# Patient Record
Sex: Male | Born: 1960 | Race: White | Hispanic: No | Marital: Married | Smoking: Never smoker
Health system: Southern US, Community
[De-identification: ages and names within clinical notes are randomized; demographics above are authoritative.]

## PROBLEM LIST (undated history)

## (undated) DIAGNOSIS — T8859XA Other complications of anesthesia, initial encounter: Secondary | ICD-10-CM

## (undated) DIAGNOSIS — I10 Essential (primary) hypertension: Secondary | ICD-10-CM

## (undated) DIAGNOSIS — E785 Hyperlipidemia, unspecified: Secondary | ICD-10-CM

## (undated) DIAGNOSIS — E119 Type 2 diabetes mellitus without complications: Secondary | ICD-10-CM

## (undated) DIAGNOSIS — T4145XA Adverse effect of unspecified anesthetic, initial encounter: Secondary | ICD-10-CM

## (undated) DIAGNOSIS — G43909 Migraine, unspecified, not intractable, without status migrainosus: Secondary | ICD-10-CM

## (undated) HISTORY — DX: Hyperlipidemia, unspecified: E78.5

## (undated) HISTORY — DX: Migraine, unspecified, not intractable, without status migrainosus: G43.909

## (undated) HISTORY — PX: COLONOSCOPY: SHX174

## (undated) HISTORY — DX: Type 2 diabetes mellitus without complications: E11.9

## (undated) HISTORY — PX: BACK SURGERY: SHX140

---

## 2002-03-27 LAB — HM COLONOSCOPY

## 2003-09-08 HISTORY — PX: HEMORRHOID SURGERY: SHX153

## 2013-11-06 ENCOUNTER — Ambulatory Visit (INDEPENDENT_AMBULATORY_CARE_PROVIDER_SITE_OTHER): Payer: BC Managed Care – PPO | Admitting: Adult Health

## 2013-11-06 ENCOUNTER — Encounter: Payer: Self-pay | Admitting: Adult Health

## 2013-11-06 VITALS — BP 128/78 | HR 96 | Temp 98.1°F | Resp 14 | Ht 70.5 in | Wt 146.0 lb

## 2013-11-06 DIAGNOSIS — Z0000 Encounter for general adult medical examination without abnormal findings: Secondary | ICD-10-CM

## 2013-11-06 DIAGNOSIS — IMO0002 Reserved for concepts with insufficient information to code with codable children: Secondary | ICD-10-CM

## 2013-11-06 DIAGNOSIS — E1165 Type 2 diabetes mellitus with hyperglycemia: Secondary | ICD-10-CM

## 2013-11-06 DIAGNOSIS — IMO0001 Reserved for inherently not codable concepts without codable children: Secondary | ICD-10-CM

## 2013-11-06 DIAGNOSIS — E538 Deficiency of other specified B group vitamins: Secondary | ICD-10-CM

## 2013-11-06 DIAGNOSIS — E559 Vitamin D deficiency, unspecified: Secondary | ICD-10-CM

## 2013-11-06 MED ORDER — SITAGLIP PHOS-METFORMIN HCL ER 50-1000 MG PO TB24: 1.0000 | ORAL_TABLET | Freq: Every day | ORAL | Status: DC

## 2013-11-06 MED ORDER — BUTALBITAL-APAP-CAFFEINE 50-325-40 MG PO TABS: 1.0000 | ORAL_TABLET | Freq: Four times a day (QID) | ORAL | Status: DC | PRN

## 2013-11-06 MED ORDER — GLIMEPIRIDE 4 MG PO TABS: 4.0000 mg | ORAL_TABLET | Freq: Every day | ORAL | Status: DC

## 2013-11-06 MED ORDER — PROPRANOLOL HCL 80 MG PO TABS: 80.0000 mg | ORAL_TABLET | Freq: Two times a day (BID) | ORAL | Status: DC

## 2013-11-06 NOTE — Progress Notes (Signed)
Subjective:    Patient ID: Jonathan West, male    DOB: [birth year], 53 y.o.   MRN: [id number]  HPI  Pt is a pleasant 53 y/o male who presents to clinic to establish care. He has a history of DM type 2. Has not taken any of his medications in > 1 year. He reports that his physician changed practices and he just became "hard headed". He reports > 45 lb weight loss in 8 months. He actually decided to establish with new PCP secondary to his weight loss for fear "that something was terribly wrong". He denies feeling bad. He has a good appetite but continues to lose weight. 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     Past Medical History  Diagnosis Date  . Diabetes mellitus without complication   . Migraines      Past Surgical History  Procedure Laterality Date  . Hemorrhoid surgery  2005    [facility] Knee      Family History  Problem Relation Age of Onset  . Heart disease Father   . Diabetes Father   . Heart disease Maternal Grandfather   . Heart disease Paternal Grandfather      History   Social History  . Marital Status: Married    Spouse Name: N/A    Number of Children: N/A  . Years of Education: N/A   Occupational History  . Not on file.   Social History Main Topics  . Smoking status: Never Smoker   . Smokeless tobacco: Not on file  . Alcohol Use: No  . Drug Use: No  . Sexual Activity: Not on file   Other Topics Concern  . Not on file   Social History Narrative  . No narrative on file     Review of Systems  Constitutional: Positive for unexpected weight change. Negative for activity change, appetite change and fatigue.  HENT: Negative.   Eyes: Positive for visual disturbance.  Respiratory: Negative.   Cardiovascular: Negative.   Gastrointestinal: Negative.   Endocrine: Positive for polydipsia.  Genitourinary: Negative.   Musculoskeletal: Negative.   Skin: Negative.   Allergic/Immunologic: Negative.   Neurological:  Negative.  Numbness: numbness and tingling feet.  Hematological: Negative.   Psychiatric/Behavioral: Negative for sleep disturbance. The patient is nervous/anxious.        Objective:   Physical Exam  Constitutional: He is oriented to person, place, and time. He appears well-developed and well-nourished. No distress.  Thin, pleasant gentleman in NAD. Appears concerned about significant weight loss  HENT:  Head: Normocephalic and atraumatic.  Mouth/Throat: No oropharyngeal exudate.  Eyes: Conjunctivae and EOM are normal. Pupils are equal, round, and reactive to light.  Neck: Normal range of motion. Neck supple. No tracheal deviation present.  Cardiovascular: Normal rate, regular rhythm, normal heart sounds and intact distal pulses.  Exam reveals no gallop and no friction rub.   No murmur heard. Pulmonary/Chest: Effort normal and breath sounds normal. No respiratory distress. He has no wheezes. He has no rales.  Abdominal: Soft. Bowel sounds are normal.  Musculoskeletal: Normal range of motion. He exhibits no edema.  Neurological: He is alert and oriented to person, place, and time. He has normal reflexes. No cranial nerve deficit. Coordination normal.  Skin: Skin is warm and dry.  Psychiatric: He has a normal mood and affect. His behavior is normal. Judgment and thought content normal.       Assessment & Plan:  1. DM (diabetes mellitus), type 2, uncontrolled Has not taken his diabetes medications in > 1 year. Suspect A1C is significantly elevated. He has not been monitoring his blood glucose levels either. Significant weight loss. Check labs including kidney function.  - Hemoglobin A1c; Future - Comprehensive metabolic panel - Hemoglobin A1c  2. Routine general medical examination at a health care facility Normal physical exam except for problems listed separately. Check labs. Request records from previous PCP - CBC with Differential - Lipid panel; Future - TSH  3. B12  deficiency Hx of B12 def. Not currently taking any supplements. Check labs.  - Vitamin B12  4. Vitamin D deficiency Hx of Vit D deficiency. Check levels.  - Vit D  25 hydroxy; Future

## 2013-11-06 NOTE — Patient Instructions (Signed)
   Thank you for choosing [facility] for your health care needs.  Please have your labs drawn prior to leaving the office.  The results will be available through MyChart for your convenience. Please remember to activate this. The activation code is located at the end of this form.  Please start your medications as prescribed. I have sent in the prescriptions to your pharmacy.

## 2013-11-06 NOTE — Progress Notes (Signed)
Pre visit review using our clinic review tool, if applicable. No additional management support is needed unless otherwise documented below in the visit note. 

## 2013-11-07 ENCOUNTER — Encounter: Payer: Self-pay | Admitting: Adult Health

## 2013-11-07 ENCOUNTER — Telehealth: Payer: Self-pay | Admitting: *Deleted

## 2013-11-07 ENCOUNTER — Ambulatory Visit (INDEPENDENT_AMBULATORY_CARE_PROVIDER_SITE_OTHER): Payer: BC Managed Care – PPO | Admitting: Adult Health

## 2013-11-07 VITALS — BP 108/78 | HR 85 | Temp 98.7°F | Resp 12 | Wt 146.0 lb

## 2013-11-07 DIAGNOSIS — E119 Type 2 diabetes mellitus without complications: Secondary | ICD-10-CM

## 2013-11-07 DIAGNOSIS — R899 Unspecified abnormal finding in specimens from other organs, systems and tissues: Secondary | ICD-10-CM

## 2013-11-07 DIAGNOSIS — R6889 Other general symptoms and signs: Secondary | ICD-10-CM

## 2013-11-07 LAB — CBC WITH DIFFERENTIAL/PLATELET
BASOS ABS: 0 10*3/uL (ref 0.0–0.1)
Basophils Relative: 0.9 % (ref 0.0–3.0)
EOS PCT: 2.1 % (ref 0.0–5.0)
Eosinophils Absolute: 0.1 10*3/uL (ref 0.0–0.7)
HEMATOCRIT: 46.3 % (ref 39.0–52.0)
HEMOGLOBIN: 15.8 g/dL (ref 13.0–17.0)
LYMPHS ABS: 1.2 10*3/uL (ref 0.7–4.0)
Lymphocytes Relative: 22.2 % (ref 12.0–46.0)
MCHC: 34.1 g/dL (ref 30.0–36.0)
MCV: 87.9 fl (ref 78.0–100.0)
MONO ABS: 0.4 10*3/uL (ref 0.1–1.0)
MONOS PCT: 6.9 % (ref 3.0–12.0)
NEUTROS ABS: 3.6 10*3/uL (ref 1.4–7.7)
Neutrophils Relative %: 67.9 % (ref 43.0–77.0)
Platelets: 191 10*3/uL (ref 150.0–400.0)
RBC: 5.28 Mil/uL (ref 4.22–5.81)
RDW: 12.6 % (ref 11.5–14.6)
WBC: 5.3 10*3/uL (ref 4.5–10.5)

## 2013-11-07 LAB — COMPREHENSIVE METABOLIC PANEL
ALT: 20 U/L (ref 0–53)
AST: 16 U/L (ref 0–37)
Albumin: 4 g/dL (ref 3.5–5.2)
Alkaline Phosphatase: 172 U/L — ABNORMAL HIGH (ref 39–117)
BUN: 15 mg/dL (ref 6–23)
CO2: 25 meq/L (ref 19–32)
CREATININE: 1.1 mg/dL (ref 0.4–1.5)
Calcium: 9.1 mg/dL (ref 8.4–10.5)
Chloride: 90 mEq/L — ABNORMAL LOW (ref 96–112)
GFR: 73.16 mL/min (ref >60.00)
Glucose, Bld: 744 mg/dL (ref 70–99)
Potassium: 4.4 mEq/L (ref 3.5–5.1)
Sodium: 125 mEq/L — ABNORMAL LOW (ref 135–145)
Total Bilirubin: 0.8 mg/dL (ref 0.3–1.2)
Total Protein: 7 g/dL (ref 6.0–8.3)

## 2013-11-07 LAB — VITAMIN B12: Vitamin B-12: 405 pg/mL (ref 211–911)

## 2013-11-07 LAB — VITAMIN D 25 HYDROXY (VIT D DEFICIENCY, FRACTURES): Vit D, 25-Hydroxy: 24 ng/mL — ABNORMAL LOW (ref 30–89)

## 2013-11-07 LAB — GLUCOSE, POCT (MANUAL RESULT ENTRY): POC GLUCOSE: 240 mg/dL — AB (ref 70–99)

## 2013-11-07 LAB — TSH: TSH: 0.82 u[IU]/mL (ref 0.35–5.50)

## 2013-11-07 LAB — HEMOGLOBIN A1C: Hgb A1c MFr Bld: 16.4 % — ABNORMAL HIGH (ref 4.6–6.5)

## 2013-11-07 MED ORDER — INSULIN PEN NEEDLE 32G X 4 MM MISC: Status: DC

## 2013-11-07 MED ORDER — ERGOCALCIFEROL 1.25 MG (50000 UT) PO CAPS: 50000.0000 [IU] | ORAL_CAPSULE | ORAL | Status: DC

## 2013-11-07 MED ORDER — GLUCOSE BLOOD VI STRP: ORAL_STRIP | Status: DC

## 2013-11-07 MED ORDER — INSULIN ASPART 100 UNIT/ML FLEXPEN: 10.0000 [IU] | PEN_INJECTOR | Freq: Every day | SUBCUTANEOUS | Status: DC

## 2013-11-07 NOTE — Telephone Encounter (Signed)
Needs to be seen today

## 2013-11-07 NOTE — Patient Instructions (Signed)
  Start Vitamin D (Drisdol) 1 capsule weekly.  STart NovoLog Flex pen - 10 units every morning.  Check your blood sugar levels before breakfast, lunch, dinner and before bedtime. Write down these numbers and bring them with you on your next visit.  Come in for a follow up visit on Friday.

## 2013-11-07 NOTE — Telephone Encounter (Signed)
Spoke with pt, explained to him that we would need to see him today, glucose was 744, pt agreed to be here today 11/07/13 at 4:15

## 2013-11-07 NOTE — Progress Notes (Signed)
Pre visit review using our clinic review tool, if applicable. No additional management support is needed unless otherwise documented below in the visit note. 

## 2013-11-07 NOTE — Telephone Encounter (Signed)
DCVUDTHY from [facility] called with critical results on your patient as noted below:  Critical Glucose 744.   Encounter forwarded to Raquel as high priority and printed and given to Raquel.

## 2013-11-07 NOTE — Progress Notes (Signed)
Patient ID: Jonathan West, male   DOB: Apr 12, 1961, 53 y.o.   MRN: [id number]    Subjective:    Patient ID: Jonathan West, male    DOB: May 27, 1961, 52 y.o.   MRN: [id number]  Diabetes Hypoglycemia symptoms include nervousness/anxiousness. Pertinent negatives for hypoglycemia include no confusion. Associated symptoms include polydipsia and polyphagia.    Pt was seen in clinic yesterday to establish care. Hx of DM not taken his DM medications for ~ 1.5 years. I was called today that patient's glucose was 744. Pt was notified and asked to come to clinic to start insulin - education.   Past Medical History  Diagnosis Date  . Diabetes mellitus without complication   . Migraines     Current Outpatient Prescriptions on File Prior to Visit  Medication Sig Dispense Refill  . butalbital-acetaminophen-caffeine (FIORICET, ESGIC) 50-325-40 MG per tablet Take 1 tablet by mouth every 6 (six) hours as needed for headache.  30 tablet  4  . glimepiride (AMARYL) 4 MG tablet Take 1 tablet (4 mg total) by mouth daily before breakfast.  30 tablet  11  . propranolol (INDERAL) 80 MG tablet Take 1 tablet (80 mg total) by mouth 2 (two) times daily.  60 tablet  11  . SitaGLIPtin-MetFORMIN HCl 50-1000 MG TB24 Take 1 tablet by mouth daily.  30 tablet  0   No current facility-administered medications on file prior to visit.     Review of Systems  Constitutional: Positive for unexpected weight change.  Eyes: Negative for visual disturbance.  Endocrine: Positive for polydipsia and polyphagia.  Neurological: Negative for numbness.  Psychiatric/Behavioral: Negative for suicidal ideas, behavioral problems, confusion, sleep disturbance and agitation. The patient is nervous/anxious.   All other systems reviewed and are negative.       Objective:  BP 108/78  Pulse 85  Temp(Src) 98.7 F (37.1 C) (Oral)  Resp 12  Wt 146 lb (66.225 kg)  SpO2 97%   Physical Exam  Constitutional: He is oriented to person,  place, and time. He appears well-developed and well-nourished. No distress.  HENT:  Head: Normocephalic and atraumatic.  Eyes: Conjunctivae and EOM are normal.  Neck: Normal range of motion. Neck supple.  Cardiovascular: Normal rate, regular rhythm, normal heart sounds and intact distal pulses.  Exam reveals no gallop and no friction rub.   No murmur heard. Pulmonary/Chest: Effort normal. No respiratory distress.  Musculoskeletal: Normal range of motion. He exhibits no edema.  Neurological: He is alert and oriented to person, place, and time. He has normal reflexes. No cranial nerve deficit. Coordination normal.  Skin: Skin is warm and dry.  Psychiatric: He has a normal mood and affect. His behavior is normal. Judgment and thought content normal.       Assessment & Plan:   1. Diabetes Uncontrolled diabetes. Lab called with reports of pt's glucose of 744, HgbA1c 16.4% Pt was asked to come to clinic. Blood glucose was 240. He started taking his diabetes meds today. Start Novolog flexpen 10 units in the morning. Spent greater than 45 min of face to face time in the education of diet, medication and insulin administration. Pt self administered dose of insulin successfully. He will RTC on Friday afternoon with blood glucose log. He is to check BG 4 times a day until sugars are better controlled. We will titrate up his insulin as needed.  - POCT Glucose (CBG) - glucose blood test strip; One Touch Ultra strips to use at home to test blood sugars 2 times  daily.  Dispense: 100 each; Refill: 12 - Insulin Pen Needle 32G X 4 MM MISC; Use with insulin pen to inject into skin.  Dispense: 100 each; Refill: 2  2. Abnormal laboratory test results Labs results were reviewed with pt including Vitamin D level low. Start Drisdol 50,000 units weekly x 12 weeks then reassess.

## 2013-11-08 ENCOUNTER — Telehealth: Payer: Self-pay

## 2013-11-08 NOTE — Telephone Encounter (Signed)
Relevant patient education assigned to patient using Emmi. ° °

## 2013-11-09 ENCOUNTER — Encounter: Payer: Self-pay | Admitting: Adult Health

## 2013-11-09 DIAGNOSIS — E538 Deficiency of other specified B group vitamins: Secondary | ICD-10-CM | POA: Insufficient documentation

## 2013-11-09 DIAGNOSIS — IMO0002 Reserved for concepts with insufficient information to code with codable children: Secondary | ICD-10-CM | POA: Insufficient documentation

## 2013-11-09 DIAGNOSIS — Z0000 Encounter for general adult medical examination without abnormal findings: Secondary | ICD-10-CM | POA: Insufficient documentation

## 2013-11-09 DIAGNOSIS — E1165 Type 2 diabetes mellitus with hyperglycemia: Secondary | ICD-10-CM | POA: Insufficient documentation

## 2013-11-09 DIAGNOSIS — E559 Vitamin D deficiency, unspecified: Secondary | ICD-10-CM | POA: Insufficient documentation

## 2013-11-09 DIAGNOSIS — E118 Type 2 diabetes mellitus with unspecified complications: Secondary | ICD-10-CM | POA: Insufficient documentation

## 2013-11-10 ENCOUNTER — Encounter: Payer: Self-pay | Admitting: Adult Health

## 2013-11-10 ENCOUNTER — Ambulatory Visit (INDEPENDENT_AMBULATORY_CARE_PROVIDER_SITE_OTHER): Payer: BC Managed Care – PPO | Admitting: Adult Health

## 2013-11-10 VITALS — BP 100/60 | HR 67 | Resp 14 | Wt 146.0 lb

## 2013-11-10 DIAGNOSIS — IMO0001 Reserved for inherently not codable concepts without codable children: Secondary | ICD-10-CM

## 2013-11-10 DIAGNOSIS — IMO0002 Reserved for concepts with insufficient information to code with codable children: Secondary | ICD-10-CM

## 2013-11-10 DIAGNOSIS — E1165 Type 2 diabetes mellitus with hyperglycemia: Secondary | ICD-10-CM

## 2013-11-10 LAB — GLUCOSE, POCT (MANUAL RESULT ENTRY): POC GLUCOSE: 288 mg/dL — AB (ref 70–99)

## 2013-11-10 MED ORDER — INSULIN ASPART 100 UNIT/ML FLEXPEN: 12.0000 [IU] | PEN_INJECTOR | Freq: Every day | SUBCUTANEOUS | Status: DC

## 2013-11-10 NOTE — Progress Notes (Signed)
Pre visit review using our clinic review tool, if applicable. No additional management support is needed unless otherwise documented below in the visit note. 

## 2013-11-10 NOTE — Progress Notes (Signed)
Patient ID: Jonathan West, male   DOB: 1961/05/21, 53 y.o.   MRN: [id number]    Subjective:    Patient ID: Jonathan West, male    DOB: 21-Apr-1961, 53 y.o.   MRN: [id number]  HPI  Mr. Yearby is here today for f/u diabetes since being started on NovoLog 10 units daily. He has been very compliant with all of his medications, checking his blood glucose levels 4 times a day and monitoring his diet. He has been following Dr. Lupita Dawn low glycemic diet. His blood glucose now in clinic is 288 but he thinks it may have been related to barbeque sauce he had at lunch. His blood glucose levels are running between 140s - 160s. He has no reported lows. Highest BG has been in the morning and they have been 200. Pt is motivated to improve his uncontrolled diabetes with A1c of 16.4%    Past Medical History  Diagnosis Date  . Diabetes mellitus without complication   . Migraines     Current Outpatient Prescriptions on File Prior to Visit  Medication Sig Dispense Refill  . butalbital-acetaminophen-caffeine (FIORICET, ESGIC) 50-325-40 MG per tablet Take 1 tablet by mouth every 6 (six) hours as needed for headache.  30 tablet  4  . ergocalciferol (DRISDOL) 50000 UNITS capsule Take 1 capsule (50,000 Units total) by mouth once a week. Take once weekly for 12 weeks  4 capsule  2  . glucose blood test strip One Touch Ultra strips to use at home to test blood sugars 2 times daily.  100 each  12  . Insulin Pen Needle 32G X 4 MM MISC Use with insulin pen to inject into skin.  100 each  2  . propranolol (INDERAL) 80 MG tablet Take 1 tablet (80 mg total) by mouth 2 (two) times daily.  60 tablet  11  . SitaGLIPtin-MetFORMIN HCl 50-1000 MG TB24 Take 1 tablet by mouth daily.  30 tablet  0   No current facility-administered medications on file prior to visit.     Review of Systems  Constitutional: Negative.   Respiratory: Negative.   Cardiovascular: Negative.   Gastrointestinal: Negative.  Negative for abdominal pain  and diarrhea.  Endocrine: Negative for polydipsia, polyphagia and polyuria.  Genitourinary: Negative.   All other systems reviewed and are negative.       Objective:  BP 100/60  Pulse 67  Resp 14  Wt 146 lb (66.225 kg)  SpO2 97%   Physical Exam  Constitutional: He is oriented to person, place, and time. He appears well-developed and well-nourished. No distress.  Cardiovascular: Normal rate and regular rhythm.   Pulmonary/Chest: Effort normal. No respiratory distress.  Neurological: He is alert and oriented to person, place, and time.  Skin: Skin is warm and dry.  Psychiatric: He has a normal mood and affect. His behavior is normal. Judgment and thought content normal.       Assessment & Plan:   1. Diabetes type 2, uncontrolled He is doing well with medications, diet, monitoring glucose. Increase NovoLog to 12 units daily. Follow up in 1 month.  - POCT Glucose (CBG)

## 2013-12-12 ENCOUNTER — Ambulatory Visit (INDEPENDENT_AMBULATORY_CARE_PROVIDER_SITE_OTHER): Payer: BC Managed Care – PPO | Admitting: Adult Health

## 2013-12-12 ENCOUNTER — Encounter: Payer: Self-pay | Admitting: Adult Health

## 2013-12-12 VITALS — BP 102/68 | HR 63 | Temp 98.8°F | Resp 14 | Wt 156.0 lb

## 2013-12-12 DIAGNOSIS — R6889 Other general symptoms and signs: Secondary | ICD-10-CM

## 2013-12-12 DIAGNOSIS — R899 Unspecified abnormal finding in specimens from other organs, systems and tissues: Secondary | ICD-10-CM

## 2013-12-12 DIAGNOSIS — E1165 Type 2 diabetes mellitus with hyperglycemia: Secondary | ICD-10-CM

## 2013-12-12 DIAGNOSIS — IMO0002 Reserved for concepts with insufficient information to code with codable children: Secondary | ICD-10-CM

## 2013-12-12 DIAGNOSIS — IMO0001 Reserved for inherently not codable concepts without codable children: Secondary | ICD-10-CM

## 2013-12-12 LAB — COMPREHENSIVE METABOLIC PANEL
ALT: 24 U/L (ref 0–53)
AST: 17 U/L (ref 0–37)
Albumin: 3.9 g/dL (ref 3.5–5.2)
Alkaline Phosphatase: 77 U/L (ref 39–117)
BUN: 21 mg/dL (ref 6–23)
CALCIUM: 9.3 mg/dL (ref 8.4–10.5)
CHLORIDE: 104 meq/L (ref 96–112)
CO2: 28 mEq/L (ref 19–32)
Creatinine, Ser: 0.9 mg/dL (ref 0.4–1.5)
GFR: 91.98 mL/min (ref >60.00)
Glucose, Bld: 156 mg/dL — ABNORMAL HIGH (ref 70–99)
POTASSIUM: 4.3 meq/L (ref 3.5–5.1)
Sodium: 138 mEq/L (ref 135–145)
TOTAL PROTEIN: 6.4 g/dL (ref 6.0–8.3)
Total Bilirubin: 0.9 mg/dL (ref 0.3–1.2)

## 2013-12-12 LAB — HEMOGLOBIN A1C: HEMOGLOBIN A1C: 10.1 % — AB (ref 4.6–6.5)

## 2013-12-12 NOTE — Progress Notes (Signed)
Pre visit review using our clinic review tool, if applicable. No additional management support is needed unless otherwise documented below in the visit note. 

## 2013-12-12 NOTE — Progress Notes (Signed)
Patient ID: Jonathan West, male   DOB: [birth year], 53 y.o.   MRN: [id number]    Subjective:    Patient ID: Jonathan West, male    DOB: 1961/04/20, 53 y.o.   MRN: [id number]  HPI  Pt is a pleasant 53 y/o male who presents for DM type 2 follow up. He is compliant with diet and medication. He brings his BG log with him showing a few readings in the high 60s. These usually occur when he has been hours without any PO. 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 Good about taking his lunch and snacks with him to work. Average blood glucose reading 130 - 140. He is feeling much better since diagnosis and beginning treatment. He will schedule his diabetic eye exam within the next couple of months.   Past Medical History  Diagnosis Date  . Diabetes mellitus without complication   . Migraines     Current Outpatient Prescriptions on File Prior to Visit  Medication Sig Dispense Refill  . butalbital-acetaminophen-caffeine (FIORICET, ESGIC) 50-325-40 MG per tablet Take 1 tablet by mouth every 6 (six) hours as needed for headache.  30 tablet  4  . ergocalciferol (DRISDOL) 50000 UNITS capsule Take 1 capsule (50,000 Units total) by mouth once a week. Take once weekly for 12 weeks  4 capsule  2  . glimepiride (AMARYL) 4 MG tablet Take 4 mg by mouth daily with breakfast.      . glucose blood test strip One Touch Ultra strips to use at home to test blood sugars 2 times daily.  100 each  12  . insulin aspart (NOVOLOG FLEXPEN) 100 UNIT/ML FlexPen Inject 12 Units into the skin daily.  4 pen  11  . Insulin Pen Needle 32G X 4 MM MISC Use with insulin pen to inject into skin.  100 each  2  . propranolol (INDERAL) 80 MG tablet Take 1 tablet (80 mg total) by mouth 2 (two) times daily.  60 tablet  11  . SitaGLIPtin-MetFORMIN HCl 50-1000 MG TB24 Take 1 tablet by mouth daily.  30 tablet  0   No current facility-administered medications on file prior to visit.     Review of Systems    Constitutional: Negative.   HENT: Negative.   Eyes: Negative.   Respiratory: Negative.   Cardiovascular: Negative.   Gastrointestinal: Negative.   Endocrine: Negative.   Genitourinary: Negative.   Musculoskeletal: Negative.   Skin: Negative.   Allergic/Immunologic: Negative.   Neurological: Negative.   Hematological: Negative.   Psychiatric/Behavioral: Negative.        Objective:  BP 102/68  Pulse 63  Temp(Src) 98.8 F (37.1 C) (Oral)  Resp 14  Wt 156 lb (70.761 kg)  SpO2 97%   Physical Exam  Constitutional: He is oriented to person, place, and time. He appears well-developed and well-nourished.  HENT:  Head: Normocephalic and atraumatic.  Cardiovascular: Normal rate, regular rhythm and normal heart sounds.  Exam reveals no gallop and no friction rub.   No murmur heard. Pulmonary/Chest: Effort normal and breath sounds normal. No respiratory distress. He has no wheezes. He has no rales.  Musculoskeletal: Normal range of motion.  Neurological: He is alert and oriented to person, place, and time. No cranial nerve deficit. Coordination normal.  Psychiatric: He has a normal mood and affect. His behavior is normal. Judgment and thought content normal.      Assessment & Plan:   1. Type II diabetes mellitus, uncontrolled Blood  glucose better controlled. Compliant with diet, medications. Check HgbA1c - Hemoglobin A1c; Future - Hemoglobin A1c  2. Abnormal laboratory test Patient had abnormal sodium level and alk phos. Recheck labs - Comprehensive metabolic panel

## 2014-01-10 ENCOUNTER — Telehealth: Payer: Self-pay

## 2014-01-10 NOTE — Telephone Encounter (Signed)
Received a call from Jonathan West regarding 3 questions. Patient asked if we had samples of Invokamet 50/10000mg  and if so could his wife pick it up for him. I informed him that we did have samples and I would leave them at the front desk for pick up. Patient asked after completing his Rx for vitamin D he was told to take over the counter dose daily, what dose do I take? You can take Vitamin D 1,000units over the counter. Lastly the patient asked if he should be taking B-12 bercause he saw B12 defeciency on his lab results. I explained to him that the reason the lab was done was to check for b12 deficiency and his levels were within normal limits. Patient verbalized understanding.

## 2014-02-20 ENCOUNTER — Telehealth: Payer: Self-pay | Admitting: Adult Health

## 2014-02-20 ENCOUNTER — Other Ambulatory Visit: Payer: Self-pay | Admitting: Adult Health

## 2014-02-20 MED ORDER — INSULIN GLARGINE 300 UNIT/ML ~~LOC~~ SOPN: 12.0000 [IU] | PEN_INJECTOR | Freq: Every day | SUBCUTANEOUS | Status: DC

## 2014-02-20 NOTE — Progress Notes (Signed)
Changing insulin to Toujeo 12 units in the morning. This medication should be $15 copay with insurance or cash (I have a discount card if he needs). Tell him to call pharmacy prior because sometimes they ask for prior authorization.

## 2014-02-20 NOTE — Progress Notes (Signed)
Spoke to patient and he stated that [facility] called to notify him, his Rx was ready for pick up. Patient stated that it is a $15 co pay with a discount card but you can only use the card 1 time. Patient also stated that in only comes 3 pens in a box and his insurance will not cover it because it is more than 1 month supply. It will cost him $300+ next time he gets it. Patient has appointment scheduled 03/20/14 and would like to discuss other options at that time.

## 2014-02-20 NOTE — Telephone Encounter (Signed)
Patient wanting to know if it was possible to switch his novolog flexpen to Levimer due to his Novolog being too expensive. Please advise.

## 2014-02-20 NOTE — Telephone Encounter (Signed)
The patient is currently using NOVOLOG FLEXPEN 100 UNIT/ML FlexPen.  He is wanting to switch to [employer] for insurance cost only  if this medication is close to the one he is currently taking

## 2014-02-22 NOTE — Progress Notes (Signed)
What was the result of this after I called the pharmaceutical rep and he said it would be $15 monthly with the card?

## 2014-02-23 NOTE — Progress Notes (Signed)
I let Mr. Rosenberg know that it should be $15 monthly for him. Patient verbalized understanding. Told him to give me a call if he has any problems the next time he refills the medication.

## 2014-03-20 ENCOUNTER — Ambulatory Visit: Payer: BC Managed Care – PPO | Admitting: Adult Health

## 2014-03-27 ENCOUNTER — Ambulatory Visit (INDEPENDENT_AMBULATORY_CARE_PROVIDER_SITE_OTHER): Payer: BC Managed Care – PPO | Admitting: Adult Health

## 2014-03-27 ENCOUNTER — Other Ambulatory Visit: Payer: Self-pay | Admitting: Adult Health

## 2014-03-27 ENCOUNTER — Encounter: Payer: Self-pay | Admitting: Adult Health

## 2014-03-27 VITALS — BP 134/80 | HR 73 | Temp 98.1°F | Resp 14 | Wt 167.5 lb

## 2014-03-27 DIAGNOSIS — Z13220 Encounter for screening for lipoid disorders: Secondary | ICD-10-CM

## 2014-03-27 DIAGNOSIS — IMO0002 Reserved for concepts with insufficient information to code with codable children: Secondary | ICD-10-CM

## 2014-03-27 DIAGNOSIS — E1165 Type 2 diabetes mellitus with hyperglycemia: Secondary | ICD-10-CM

## 2014-03-27 DIAGNOSIS — N529 Male erectile dysfunction, unspecified: Secondary | ICD-10-CM

## 2014-03-27 DIAGNOSIS — IMO0001 Reserved for inherently not codable concepts without codable children: Secondary | ICD-10-CM

## 2014-03-27 DIAGNOSIS — N521 Erectile dysfunction due to diseases classified elsewhere: Secondary | ICD-10-CM

## 2014-03-27 DIAGNOSIS — E1169 Type 2 diabetes mellitus with other specified complication: Secondary | ICD-10-CM | POA: Insufficient documentation

## 2014-03-27 LAB — TESTOSTERONE: Testosterone: 405.7 ng/dL (ref 300.00–890.00)

## 2014-03-27 LAB — LIPID PANEL
CHOL/HDL RATIO: 4
Cholesterol: 158 mg/dL (ref 0–200)
HDL: 36.2 mg/dL — ABNORMAL LOW (ref >39.00)
LDL Cholesterol: 109 mg/dL — ABNORMAL HIGH (ref 0–99)
NONHDL: 121.8
Triglycerides: 65 mg/dL (ref 0.0–149.0)
VLDL: 13 mg/dL (ref 0.0–40.0)

## 2014-03-27 LAB — BASIC METABOLIC PANEL
BUN: 16 mg/dL (ref 6–23)
CHLORIDE: 100 meq/L (ref 96–112)
CO2: 26 mEq/L (ref 19–32)
CREATININE: 1 mg/dL (ref 0.4–1.5)
Calcium: 9.3 mg/dL (ref 8.4–10.5)
GFR: 85.35 mL/min (ref >60.00)
GLUCOSE: 273 mg/dL — AB (ref 70–99)
POTASSIUM: 4.8 meq/L (ref 3.5–5.1)
Sodium: 135 mEq/L (ref 135–145)

## 2014-03-27 LAB — MICROALBUMIN / CREATININE URINE RATIO
Creatinine,U: 132.3 mg/dL
Microalb Creat Ratio: 0.8 mg/g (ref 0.0–30.0)
Microalb, Ur: 1 mg/dL (ref 0.0–1.9)

## 2014-03-27 LAB — HEMOGLOBIN A1C: Hgb A1c MFr Bld: 8.1 % — ABNORMAL HIGH (ref 4.6–6.5)

## 2014-03-27 MED ORDER — SILDENAFIL CITRATE 20 MG PO TABS: ORAL_TABLET | ORAL | Status: DC

## 2014-03-27 NOTE — Progress Notes (Signed)
Pre visit review using our clinic review tool, if applicable. No additional management support is needed unless otherwise documented below in the visit note. 

## 2014-03-27 NOTE — Progress Notes (Signed)
Subjective:    Patient ID: Jonathan West, male    DOB: 30-Aug-1961, 53 y.o.   MRN: [id number]  HPI  53 yo male presents today for a follow up of his diabetes. Reports he checks his blood sugars around 3 times per day. Prior to vacation his blood sugars were averaging between 130-150. Continues to take his medication as prescribed. Indicates since switching to [facility], he has noticed his readings have been slightly higher. Denies any changes in vision, numbness or tingling, polyphagia, polydipsia or polyphagia. Proudly states it has been 140 days since his last soft drink.   Has experienced erectile dysfunction for the past several months. Describes erection as "non-existent." Has had problems achieving an erection during intimacy with wife.   Last eye exam was >1 year ago.   Past Medical History  Diagnosis Date  . Diabetes mellitus without complication   . Migraines     Current Outpatient Prescriptions on File Prior to Visit  Medication Sig Dispense Refill  . butalbital-acetaminophen-caffeine (FIORICET, ESGIC) 50-325-40 MG per tablet Take 1 tablet by mouth every 6 (six) hours as needed for headache.  30 tablet  4  . glimepiride (AMARYL) 4 MG tablet Take 4 mg by mouth daily with breakfast.      . glucose blood test strip One Touch Ultra strips to use at home to test blood sugars 2 times daily.  100 each  12  . Insulin Glargine (TOUJEO SOLOSTAR) 300 UNIT/ML SOPN Inject 12 Units into the skin daily.  1 pen  3  . Insulin Pen Needle 32G X 4 MM MISC Use with insulin pen to inject into skin.  100 each  2  . propranolol (INDERAL) 80 MG tablet Take 1 tablet (80 mg total) by mouth 2 (two) times daily.  60 tablet  11  . SitaGLIPtin-MetFORMIN HCl 50-1000 MG TB24 Take 1 tablet by mouth daily.  30 tablet  0   No current facility-administered medications on file prior to visit.     Review of Systems  Constitutional: Negative.   HENT: Negative.   Eyes: Negative.   Cardiovascular: Negative.     Gastrointestinal: Negative.   Endocrine: Negative.   Genitourinary: Negative.   Musculoskeletal: Negative.   Skin: Negative.   Allergic/Immunologic: Negative.   Neurological: Negative.   Hematological: Negative.   Psychiatric/Behavioral: Negative.        Objective:  BP 134/80  Pulse 73  Temp(Src) 98.1 F (36.7 C) (Oral)  Resp 14  Wt 167 lb 8 oz (75.978 kg)  SpO2 97%   Physical Exam  Nursing note and vitals reviewed. Constitutional: He is oriented to person, place, and time. He appears well-developed and well-nourished. No distress.  HENT:  Head: Normocephalic.  Eyes: Conjunctivae and EOM are normal. Pupils are equal, round, and reactive to light.  Neck: Normal range of motion. Neck supple.  Cardiovascular: Normal rate, regular rhythm, normal heart sounds and intact distal pulses.   Pulmonary/Chest: Effort normal and breath sounds normal.  Abdominal: Soft. Bowel sounds are normal.  Neurological: He is alert and oriented to person, place, and time. He has normal reflexes.  Skin: Skin is warm and dry.  Psychiatric: He has a normal mood and affect. His behavior is normal. Judgment and thought content normal.      Assessment & Plan:   1. DM (diabetes mellitus), type 2, uncontrolled Continues to monitor is blood glucose levels and compliant with medication. Diet for diabetes as well. Will check labs. Continue to follow. -  HgB T7S - Basic Metabolic Panel (BMET) - Urine Microalbumin w/creat. ratio  2. Erectile dysfunction associated with type 2 diabetes mellitus His diabetes has been uncontrolled for some time which can contribute to ED. Will check testosterone. If deficient will refer to Urology. Consider medication such as viagra to help with ED - Testosterone  3. Lipid screening Check cholesterol level. Pt is fasting. - lipid panel

## 2014-03-27 NOTE — Patient Instructions (Signed)
  You are doing well with your diabetes.  Please have your labs checked.  I will notify you of results once they are available along with additional instructions.

## 2014-03-30 NOTE — Progress Notes (Signed)
Spoke to patient about Rx. He stated that he could get 50 tabs for $80+. He said that they are sending 10tabs for trail to see if it works for him before he spends $31. He also stated that [facility] could not give him a price without having the Rx.

## 2014-04-02 ENCOUNTER — Encounter: Payer: Self-pay | Admitting: *Deleted

## 2014-04-02 LAB — HM DIABETES EYE EXAM

## 2014-04-02 NOTE — Progress Notes (Signed)
Chart reviewed for DM bundle.  Pt last seen 03/27/14 with labs. Next appt sch 10/15

## 2014-04-03 ENCOUNTER — Encounter: Payer: Self-pay | Admitting: Adult Health

## 2014-04-03 LAB — HM DIABETES EYE EXAM

## 2014-04-17 ENCOUNTER — Encounter: Payer: Self-pay | Admitting: Adult Health

## 2014-05-30 ENCOUNTER — Encounter: Payer: Self-pay | Admitting: *Deleted

## 2014-06-27 ENCOUNTER — Ambulatory Visit (INDEPENDENT_AMBULATORY_CARE_PROVIDER_SITE_OTHER): Payer: BC Managed Care – PPO | Admitting: Internal Medicine

## 2014-06-27 ENCOUNTER — Encounter: Payer: Self-pay | Admitting: Internal Medicine

## 2014-06-27 VITALS — BP 128/82 | HR 79 | Temp 98.7°F | Resp 16 | Ht 70.5 in | Wt 173.5 lb

## 2014-06-27 DIAGNOSIS — I10 Essential (primary) hypertension: Secondary | ICD-10-CM

## 2014-06-27 DIAGNOSIS — Z23 Encounter for immunization: Secondary | ICD-10-CM

## 2014-06-27 DIAGNOSIS — IMO0002 Reserved for concepts with insufficient information to code with codable children: Secondary | ICD-10-CM

## 2014-06-27 DIAGNOSIS — E1165 Type 2 diabetes mellitus with hyperglycemia: Secondary | ICD-10-CM

## 2014-06-27 MED ORDER — SITAGLIP PHOS-METFORMIN HCL ER 50-1000 MG PO TB24: 1.0000 | ORAL_TABLET | Freq: Every day | ORAL | Status: DC

## 2014-06-27 MED ORDER — INSULIN ISOPHANE HUMAN 100 UNIT/ML KWIKPEN: 125.0000 [IU] | PEN_INJECTOR | Freq: Every day | SUBCUTANEOUS | Status: DC

## 2014-06-27 MED ORDER — INSULIN ISOPHANE HUMAN 100 UNIT/ML KWIKPEN: 15.0000 [IU] | PEN_INJECTOR | Freq: Every day | SUBCUTANEOUS | Status: DC

## 2014-06-27 NOTE — Progress Notes (Signed)
Pre-visit discussion using our clinic review tool. No additional management support is needed unless otherwise documented below in the visit note.  

## 2014-06-27 NOTE — Patient Instructions (Addendum)
We are resuming Janumet using the coupon I have provided you  Please look up the janumet and the jentadueto on the [employer] to see which is covered  I am changing your insulin to Humulin N ,  Start with 15 units at bedtime.  Check your fasting blood sugars daily for a week.  Increase the insulin dose by 3 units every 3 days until your fasting sugars are < 130   Your blood pressure is fine,  You do not need a replacement for Inderal

## 2014-06-27 NOTE — Progress Notes (Signed)
Patient ID: Jonathan West, male   DOB: Aug 22, 1961, 53 y.o.   MRN: [id number]  Patient Active Problem List   Diagnosis Date Noted  . Essential hypertension 06/30/2014  . Erectile dysfunction associated with type 2 diabetes mellitus 03/27/2014  . Lipid screening 03/27/2014  . DM (diabetes mellitus), type 2, uncontrolled 11/09/2013  . Routine general medical examination at a health care facility 11/09/2013  . B12 deficiency 11/09/2013  . Vitamin D deficiency 11/09/2013  . Abnormal laboratory test result 11/07/2013    Subjective:  CC:   Chief Complaint  Patient presents with  . Follow-up    3 month  . Diabetes    need to talk about medications for the diabetes.    HPI:   Jonathan West is a 53 y.o. male who presents for  Follow up on diabetes mellitus uncontrolled.  Last seen in July,  Prior to Raquel's departure.  Several medication changes were made.  He was been using Toujeo for insulin ,  Waspreviously using Novolog once daily  In the morning  .  Was using Janumet but stopped using it about a year ago bc of cost and has not taken it in nearly a year.  Ran out of several of his medications recently .   Not checking sugars for the past several weeks due to lapse in medications.    Past Medical History  Diagnosis Date  . Diabetes mellitus without complication   . Migraines     Past Surgical History  Procedure Laterality Date  . Hemorrhoid surgery  2005    [facility] Dr Radene Knee        The following portions of the patient's history were reviewed and updated as appropriate: Allergies, current medications, and problem list.    Review of Systems:   Patient denies headache, fevers, malaise, unintentional weight loss, skin rash, eye pain, sinus congestion and sinus pain, sore throat, dysphagia,  hemoptysis , cough, dyspnea, wheezing, chest pain, palpitations, orthopnea, edema, abdominal pain, nausea, melena, diarrhea, constipation, flank pain, dysuria, hematuria, urinary   Frequency, nocturia, numbness, tingling, seizures,  Focal weakness, Loss of consciousness,  Tremor, insomnia, depression, anxiety, and suicidal ideation.     History   Social History  . Marital Status: Married    Spouse Name: N/A    Number of Children: N/A  . Years of Education: N/A   Occupational History  . Not on file.   Social History Main Topics  . Smoking status: Never Smoker   . Smokeless tobacco: Not on file  . Alcohol Use: No  . Drug Use: No  . Sexual Activity: Not on file   Other Topics Concern  . Not on file   Social History Narrative  . No narrative on file    Objective:  Filed Vitals:   06/27/14 1627  BP: 128/82  Pulse: 79  Temp: 98.7 F (37.1 C)  Resp: 16     General appearance: alert, cooperative and appears stated age Ears: normal TM's and external ear canals both ears Throat: lips, mucosa, and tongue normal; teeth and gums normal Neck: no adenopathy, no carotid bruit, supple, symmetrical, trachea midline and thyroid not enlarged, symmetric, no tenderness/mass/nodules Back: symmetric, no curvature. ROM normal. No CVA tenderness. Lungs: clear to auscultation bilaterally Heart: regular rate and rhythm, S1, S2 normal, no murmur, click, rub or gallop Abdomen: soft, non-tender; bowel sounds normal; no masses,  no organomegaly Pulses: 2+ and symmetric Skin: Skin color, texture, turgor normal. No rashes or lesions Lymph  nodes: Cervical, supraclavicular, and axillary nodes normal.  Assessment and Plan:  DM (diabetes mellitus), type 2, uncontrolled Changing insulin to NPH at bedtime,  Continue glimepiride,  And add janumet 50/1000 daily.  Return in one month with blood sugars.  Lab Results  Component Value Date   HGBA1C 9.1* 06/27/2014   Lab Results  Component Value Date   MICROALBUR 1.0 03/27/2014   Lab Results  Component Value Date   CREATININE 1.0 03/27/2014     Essential hypertension He had been prescribed Inderal for unclear reasons  "for my diabetrs,"  He has stopped the meidcation and bps have been normal.  He has no proteinuria  No meds needed currently.    Updated Medication List Outpatient Encounter Prescriptions as of 06/27/2014  Medication Sig  . butalbital-acetaminophen-caffeine (FIORICET, ESGIC) 50-325-40 MG per tablet Take 1 tablet by mouth every 6 (six) hours as needed for headache.  . cholecalciferol (VITAMIN D) 1000 UNITS tablet Take 2,000 Units by mouth daily.  Marland Kitchen glimepiride (AMARYL) 4 MG tablet Take 4 mg by mouth daily with breakfast.  . glucose blood test strip One Touch Ultra strips to use at home to test blood sugars 2 times daily.  . Insulin Pen Needle 32G X 4 MM MISC Use with insulin pen to inject into skin.  . [DISCONTINUED] Insulin Glargine (TOUJEO SOLOSTAR) 300 UNIT/ML SOPN Inject 12 Units into the skin daily.  . [DISCONTINUED] propranolol (INDERAL) 80 MG tablet Take 1 tablet (80 mg total) by mouth 2 (two) times daily.  . Insulin NPH, Human,, Isophane, (HUMULIN N KWIKPEN) 100 UNIT/ML Kiwkpen Inject 15 Units into the skin at bedtime.  . sildenafil (REVATIO) 20 MG tablet Take 1-5 tablets as needed for sexual activity  . SitaGLIPtin-MetFORMIN HCl 50-1000 MG TB24 Take 1 tablet by mouth daily.  . [DISCONTINUED] Insulin NPH, Human,, Isophane, (HUMULIN N KWIKPEN) 100 UNIT/ML Kiwkpen Inject 125 Units into the skin at bedtime.  . [DISCONTINUED] SitaGLIPtin-MetFORMIN HCl 50-1000 MG TB24 Take 1 tablet by mouth daily.     Orders Placed This Encounter  Procedures  . Pneumococcal polysaccharide vaccine 23-valent greater than or equal to 2yo subcutaneous/IM  . Comprehensive metabolic panel  . Hemoglobin A1c    Return in about 4 weeks (around 07/25/2014) for follow up diabetes.

## 2014-06-28 LAB — HEMOGLOBIN A1C: HEMOGLOBIN A1C: 9.1 % — AB (ref 4.6–6.5)

## 2014-06-30 DIAGNOSIS — I10 Essential (primary) hypertension: Secondary | ICD-10-CM | POA: Insufficient documentation

## 2014-06-30 DIAGNOSIS — E1159 Type 2 diabetes mellitus with other circulatory complications: Secondary | ICD-10-CM | POA: Insufficient documentation

## 2014-06-30 NOTE — Assessment & Plan Note (Signed)
He had been prescribed Inderal for unclear reasons "for my diabetrs,"  He has stopped the meidcation and bps have been normal.  He has no proteinuria  No meds needed currently.

## 2014-06-30 NOTE — Assessment & Plan Note (Signed)
Changing insulin to NPH at bedtime,  Continue glimepiride,  And add janumet 50/1000 daily.  Return in one month with blood sugars.  Lab Results  Component Value Date   HGBA1C 9.1* 06/27/2014   Lab Results  Component Value Date   MICROALBUR 1.0 03/27/2014   Lab Results  Component Value Date   CREATININE 1.0 03/27/2014

## 2014-07-01 ENCOUNTER — Encounter: Payer: Self-pay | Admitting: Internal Medicine

## 2014-07-03 DIAGNOSIS — Z7689 Persons encountering health services in other specified circumstances: Secondary | ICD-10-CM

## 2014-07-25 ENCOUNTER — Other Ambulatory Visit: Payer: Self-pay | Admitting: Internal Medicine

## 2014-07-25 ENCOUNTER — Ambulatory Visit (INDEPENDENT_AMBULATORY_CARE_PROVIDER_SITE_OTHER): Payer: BC Managed Care – PPO | Admitting: Internal Medicine

## 2014-07-25 ENCOUNTER — Encounter: Payer: Self-pay | Admitting: Internal Medicine

## 2014-07-25 VITALS — BP 134/86 | HR 93 | Temp 98.7°F | Resp 16 | Ht 70.0 in | Wt 176.2 lb

## 2014-07-25 DIAGNOSIS — IMO0002 Reserved for concepts with insufficient information to code with codable children: Secondary | ICD-10-CM

## 2014-07-25 DIAGNOSIS — I10 Essential (primary) hypertension: Secondary | ICD-10-CM

## 2014-07-25 DIAGNOSIS — E1165 Type 2 diabetes mellitus with hyperglycemia: Secondary | ICD-10-CM

## 2014-07-25 DIAGNOSIS — Z1211 Encounter for screening for malignant neoplasm of colon: Secondary | ICD-10-CM

## 2014-07-25 LAB — COMPREHENSIVE METABOLIC PANEL
ALBUMIN: 4.4 g/dL (ref 3.5–5.2)
ALK PHOS: 83 U/L (ref 39–117)
ALT: 16 U/L (ref 0–53)
AST: 14 U/L (ref 0–37)
BUN: 17 mg/dL (ref 6–23)
CALCIUM: 9.2 mg/dL (ref 8.4–10.5)
CHLORIDE: 107 meq/L (ref 96–112)
CO2: 26 meq/L (ref 19–32)
Creatinine, Ser: 1.1 mg/dL (ref 0.4–1.5)
GFR: 76.12 mL/min (ref >60.00)
GLUCOSE: 132 mg/dL — AB (ref 70–99)
POTASSIUM: 4 meq/L (ref 3.5–5.1)
SODIUM: 140 meq/L (ref 135–145)
TOTAL PROTEIN: 7.2 g/dL (ref 6.0–8.3)
Total Bilirubin: 1.1 mg/dL (ref 0.2–1.2)

## 2014-07-25 LAB — LIPID PANEL
CHOLESTEROL: 139 mg/dL (ref 0–200)
HDL: 34.7 mg/dL — ABNORMAL LOW (ref >39.00)
LDL CALC: 90 mg/dL (ref 0–99)
NonHDL: 104.3
Total CHOL/HDL Ratio: 4
Triglycerides: 72 mg/dL (ref 0.0–149.0)
VLDL: 14.4 mg/dL (ref 0.0–40.0)

## 2014-07-25 LAB — HEMOGLOBIN A1C: HEMOGLOBIN A1C: 8 % — AB (ref 4.6–6.5)

## 2014-07-25 LAB — HM DIABETES FOOT EXAM: HM Diabetic Foot Exam: NORMAL

## 2014-07-25 MED ORDER — SITAGLIP PHOS-METFORMIN HCL ER 50-1000 MG PO TB24: 1.0000 | ORAL_TABLET | Freq: Every day | ORAL | Status: DC

## 2014-07-25 NOTE — Progress Notes (Signed)
Patient ID: [name], male   DOB: [birth year], 53 y.o.   MRN: [id number]   Patient Active Problem List   Diagnosis Date Noted  . Screening for colon cancer 07/28/2014  . Essential hypertension 06/30/2014  . Erectile dysfunction associated with type 2 diabetes mellitus 03/27/2014  . Lipid screening 03/27/2014  . DM (diabetes mellitus), type 2, uncontrolled 11/09/2013  . Routine general medical examination at a health care facility 11/09/2013  . B12 deficiency 11/09/2013  . Vitamin D deficiency 11/09/2013  . Abnormal laboratory test result 11/07/2013    Subjective:  CC:   Chief Complaint  Patient presents with  . Follow-up  . Diabetes    HPI:   Jonathan West is a 53 y.o. male who presents for Follow up on uncontrolled Type 2 DM diagnosed at age 46.   Hypertension,  And ED,     He has been taking NPH at bedtime since his last bisit instead of mealtime insulin.  However the NPH needs prior authorization . Repeat A1c is much better,   Lab Results  Component Value Date   HGBA1C 8.0* 07/25/2014   Lab Results  Component Value Date   CHOL 139 07/25/2014   HDL 34.70* 07/25/2014   LDLCALC 90 07/25/2014   TRIG 72.0 07/25/2014   CHOLHDL 4 07/25/2014   Last colonoscopy  Was in 2003 for investigation of IDA .  Int hemorrhoids were found  By Gaylyn Cheers.  S/p hemorrhoidectomy later that year by Rochel Brome.   Past Medical History  Diagnosis Date  . Diabetes mellitus without complication   . Migraines     Past Surgical History  Procedure Laterality Date  . Hemorrhoid surgery  2005    [facility] [address] Radene Knee        The following portions of the patient's history were reviewed and updated as appropriate: Allergies, current medications, and problem list.    Review of Systems:   Patient denies headache, fevers, malaise, unintentional weight loss, skin rash, eye pain, sinus congestion and sinus pain, sore throat, dysphagia,  hemoptysis , cough, dyspnea, wheezing,  chest pain, palpitations, orthopnea, edema, abdominal pain, nausea, melena, diarrhea, constipation, flank pain, dysuria, hematuria, urinary  Frequency, nocturia, numbness, tingling, seizures,  Focal weakness, Loss of consciousness,  Tremor, insomnia, depression, anxiety, and suicidal ideation.     History   Social History  . Marital Status: Married    Spouse Name: N/A    Number of Children: N/A  . Years of Education: N/A   Occupational History  . Not on file.   Social History Main Topics  . Smoking status: Never Smoker   . Smokeless tobacco: Not on file  . Alcohol Use: No  . Drug Use: No  . Sexual Activity: Not on file   Other Topics Concern  . Not on file   Social History Narrative    Objective:  Filed Vitals:   07/25/14 1418  BP: 134/86  Pulse: 93  Temp: 98.7 F (37.1 C)  Resp: 16     General appearance: alert, cooperative and appears stated age Ears: normal TM's and external ear canals both ears Throat: lips, mucosa, and tongue normal; teeth and gums normal Neck: no adenopathy, no carotid bruit, supple, symmetrical, trachea midline and thyroid not enlarged, symmetric, no tenderness/mass/nodules Back: symmetric, no curvature. ROM normal. No CVA tenderness. Lungs: clear to auscultation bilaterally Heart: regular rate and rhythm, S1, S2 normal, no murmur, click, rub or gallop Abdomen: soft, non-tender; bowel sounds normal; no masses,  no organomegaly Pulses: 2+ and symmetric Skin: Skin color, texture, turgor normal. No rashes or lesions Lymph nodes: Cervical, supraclavicular, and axillary nodes normal.  Assessment and Plan:  Essential hypertension He had been prescribed Inderal for unclear reasons "for my diabetes,"  He has stopped the meidcation and bps have been normal.  He has no proteinuria  No meds needed currently.   Lab Results  Component Value Date   MICROALBUR 1.1 07/25/2014   Lab Results  Component Value Date   NA 140 07/25/2014   K 4.0  07/25/2014   CL 107 07/25/2014   CO2 26 07/25/2014   Lab Results  Component Value Date   CREATININE 1.1 07/25/2014       DM (diabetes mellitus), type 2, uncontrolled Improved with current regimen.  Will required  adjustments again due to cost of NPH and Janumet.  If Januvia has to be stopped,  He will need to resume Novolog tid.   Lab Results  Component Value Date   HGBA1C 8.0* 07/25/2014   Lab Results  Component Value Date   MICROALBUR 1.1 07/25/2014   Lab Results  Component Value Date   CHOL 139 07/25/2014   HDL 34.70* 07/25/2014   LDLCALC 90 07/25/2014   TRIG 72.0 07/25/2014   CHOLHDL 4 07/25/2014      A total of 30 minutes of face to face time was spent with patient more than half of which was spent in counselling and coordination of care   Updated Medication List Outpatient Encounter Prescriptions as of 07/25/2014  Medication Sig  . butalbital-acetaminophen-caffeine (FIORICET, ESGIC) 50-325-40 MG per tablet Take 1 tablet by mouth every 6 (six) hours as needed for headache.  . cholecalciferol (VITAMIN D) 1000 UNITS tablet Take 2,000 Units by mouth daily.  Marland Kitchen glimepiride (AMARYL) 4 MG tablet Take 4 mg by mouth daily with breakfast.  . glucose blood test strip One Touch Ultra strips to use at home to test blood sugars 2 times daily.  . Insulin NPH, Human,, Isophane, (HUMULIN N KWIKPEN) 100 UNIT/ML Kiwkpen Inject 15 Units into the skin at bedtime.  . Insulin Pen Needle 32G X 4 MM MISC Use with insulin pen to inject into skin.  . sildenafil (REVATIO) 20 MG tablet Take 1-5 tablets as needed for sexual activity  . SitaGLIPtin-MetFORMIN HCl 50-1000 MG TB24 Take 1 tablet by mouth daily.  . [DISCONTINUED] SitaGLIPtin-MetFORMIN HCl 50-1000 MG TB24 Take 1 tablet by mouth daily.     Orders Placed This Encounter  Procedures  . Comprehensive metabolic panel  . Comprehensive metabolic panel  . Lipid panel  . Hemoglobin A1c  . Microalbumin / creatinine urine ratio  .  Microalbumin / creatinine urine ratio  . Ambulatory referral to Gastroenterology  . HM DIABETES FOOT EXAM  . HM COLONOSCOPY    Return in about 3 months (around 10/25/2014).

## 2014-07-25 NOTE — Progress Notes (Signed)
Pre-visit discussion using our clinic review tool. No additional management support is needed unless otherwise documented below in the visit note.  

## 2014-07-25 NOTE — Patient Instructions (Signed)
Adjust  Your insulin dose at night based on what eat for dinner:  Hotpocket  Or pizza:  20 unit  chicken or salad,  No bread or potatoes : 15 units    DO NOT SKIP EATING  LUNCH !!  GET A BOX OF ATKINS BARS FOR EMERGENCIES

## 2014-07-26 ENCOUNTER — Encounter: Payer: Self-pay | Admitting: Internal Medicine

## 2014-07-26 ENCOUNTER — Telehealth: Payer: Self-pay | Admitting: *Deleted

## 2014-07-26 LAB — MICROALBUMIN / CREATININE URINE RATIO
CREATININE, U: 191.6 mg/dL
MICROALB UR: 1.1 mg/dL (ref 0.0–1.9)
Microalb Creat Ratio: 0.6 mg/g (ref 0.0–30.0)

## 2014-07-26 NOTE — Telephone Encounter (Signed)
Fax from [place], needing PA for Humulin [employer]. Started online, pending response.

## 2014-07-28 ENCOUNTER — Encounter: Payer: Self-pay | Admitting: Internal Medicine

## 2014-07-28 DIAGNOSIS — Z1211 Encounter for screening for malignant neoplasm of colon: Secondary | ICD-10-CM | POA: Insufficient documentation

## 2014-07-28 DIAGNOSIS — E119 Type 2 diabetes mellitus without complications: Secondary | ICD-10-CM | POA: Insufficient documentation

## 2014-07-28 NOTE — Assessment & Plan Note (Signed)
Improved with current regimen.  Will required  adjustments again due to cost of NPH and Janumet.  If Januvia has to be stopped,  He will need to resume Novolog tid.   Lab Results  Component Value Date   HGBA1C 8.0* 07/25/2014   Lab Results  Component Value Date   MICROALBUR 1.1 07/25/2014   Lab Results  Component Value Date   CHOL 139 07/25/2014   HDL 34.70* 07/25/2014   LDLCALC 90 07/25/2014   TRIG 72.0 07/25/2014   CHOLHDL 4 07/25/2014

## 2014-07-28 NOTE — Assessment & Plan Note (Signed)
He had been prescribed Inderal for unclear reasons "for my diabetes,"  He has stopped the meidcation and bps have been normal.  He has no proteinuria  No meds needed currently.   Lab Results  Component Value Date   MICROALBUR 1.1 07/25/2014   Lab Results  Component Value Date   NA 140 07/25/2014   K 4.0 07/25/2014   CL 107 07/25/2014   CO2 26 07/25/2014   Lab Results  Component Value Date   CREATININE 1.1 07/25/2014

## 2014-08-10 ENCOUNTER — Other Ambulatory Visit: Payer: Self-pay | Admitting: *Deleted

## 2014-08-10 MED ORDER — INSULIN PEN NEEDLE 32G X 4 MM MISC: Status: DC

## 2014-08-17 ENCOUNTER — Encounter: Payer: Self-pay | Admitting: Nurse Practitioner

## 2014-08-29 ENCOUNTER — Other Ambulatory Visit: Payer: Self-pay | Admitting: Internal Medicine

## 2014-08-29 MED ORDER — SITAGLIP PHOS-METFORMIN HCL ER 50-1000 MG PO TB24: 1.0000 | ORAL_TABLET | Freq: Every day | ORAL | Status: DC

## 2014-09-03 ENCOUNTER — Other Ambulatory Visit: Payer: Self-pay

## 2014-09-03 MED ORDER — INSULIN PEN NEEDLE 32G X 4 MM MISC: Status: DC

## 2014-09-27 ENCOUNTER — Other Ambulatory Visit: Payer: BC Managed Care – PPO

## 2014-09-27 ENCOUNTER — Other Ambulatory Visit: Payer: Self-pay

## 2014-10-17 ENCOUNTER — Other Ambulatory Visit: Payer: BLUE CROSS/BLUE SHIELD

## 2014-10-17 ENCOUNTER — Telehealth: Payer: Self-pay | Admitting: *Deleted

## 2014-10-17 DIAGNOSIS — E119 Type 2 diabetes mellitus without complications: Secondary | ICD-10-CM

## 2014-10-17 NOTE — Telephone Encounter (Signed)
He  is too early for an a1c  Come back on or after feb 18th

## 2014-10-17 NOTE — Telephone Encounter (Signed)
I've never actually seen the pt. He is under my name, but being seen by Dr. Derrel Nip. I'll cc Juliann Pulse on this.

## 2014-10-17 NOTE — Telephone Encounter (Signed)
Read below

## 2014-10-17 NOTE — Telephone Encounter (Signed)
What labs and dx?  

## 2014-10-25 ENCOUNTER — Encounter: Payer: Self-pay | Admitting: Nurse Practitioner

## 2014-10-25 ENCOUNTER — Ambulatory Visit (INDEPENDENT_AMBULATORY_CARE_PROVIDER_SITE_OTHER): Payer: BLUE CROSS/BLUE SHIELD | Admitting: Nurse Practitioner

## 2014-10-25 VITALS — BP 138/72 | HR 87 | Temp 98.2°F | Resp 14 | Ht 70.5 in | Wt 175.4 lb

## 2014-10-25 DIAGNOSIS — E119 Type 2 diabetes mellitus without complications: Secondary | ICD-10-CM

## 2014-10-25 DIAGNOSIS — M25511 Pain in right shoulder: Secondary | ICD-10-CM

## 2014-10-25 LAB — COMPREHENSIVE METABOLIC PANEL
ALK PHOS: 99 U/L (ref 39–117)
ALT: 14 U/L (ref 0–53)
AST: 16 U/L (ref 0–37)
Albumin: 4.2 g/dL (ref 3.5–5.2)
BUN: 22 mg/dL (ref 6–23)
CO2: 29 mEq/L (ref 19–32)
CREATININE: 1.15 mg/dL (ref 0.40–1.50)
Calcium: 9.3 mg/dL (ref 8.4–10.5)
Chloride: 106 mEq/L (ref 96–112)
GFR: 69.98 mL/min (ref >60.00)
Glucose, Bld: 132 mg/dL — ABNORMAL HIGH (ref 70–99)
POTASSIUM: 3.9 meq/L (ref 3.5–5.1)
Sodium: 140 mEq/L (ref 135–145)
TOTAL PROTEIN: 7.2 g/dL (ref 6.0–8.3)
Total Bilirubin: 0.5 mg/dL (ref 0.2–1.2)

## 2014-10-25 LAB — HEMOGLOBIN A1C: Hgb A1c MFr Bld: 6.9 % — ABNORMAL HIGH (ref 4.6–6.5)

## 2014-10-25 NOTE — Patient Instructions (Signed)
Let me know the medication through MyChart for your shoulder.   Please visit the lab and we will let you know results through [facility]   Make sure to eat a snack between meals if it is going to be a long period of time.

## 2014-10-25 NOTE — Progress Notes (Signed)
Subjective:    Patient ID: Jonathan West, male    DOB: July 13, 1961, 54 y.o.   MRN: [id number]  HPI  Mr. Tullier is a 54 yo male here for a follow up of his diabetes.   1) Taking glimepiride 4 mg, NPH 15 units at night, Janumet BS- 120 avg. 3-4 hours eating and fasting numbers together  Hypoglycemia- 69 last night, went 12 hours without eating  Diet- No soft drinks since 11/07/13, Cut down on sweets, likes green beans, eats sporadic 3 meals daily.  Exercise- No formal- moving constantly at work  Diabetic for 10 years  103 this morning  Colonoscopy 3 weeks ago- no polyps   2) Right shoulder pain 7-8/10 at worst 2/10 at best, trouble lifting arm, pain in anterior shoulder. He had a medication 3-4 years ago for it and it has expired since. Saw a Dr. At [facility].   Review of Systems  Constitutional: Negative for fever, chills, diaphoresis, activity change and fatigue.  Eyes: Negative for visual disturbance.  Respiratory: Negative for apnea, shortness of breath and wheezing.   Gastrointestinal: Negative for nausea, vomiting and diarrhea.  Endocrine: Negative for polydipsia, polyphagia and polyuria.  Genitourinary: Negative for dysuria and difficulty urinating.  Musculoskeletal: Positive for arthralgias. Negative for myalgias, back pain, joint swelling, gait problem, neck pain and neck stiffness.       Right shoulder pain  Skin: Negative for rash.  Neurological: Negative for dizziness, weakness and numbness.   Past Medical History  Diagnosis Date  . Diabetes mellitus without complication   . Migraines     History   Social History  . Marital Status: Married    Spouse Name: N/A  . Number of Children: N/A  . Years of Education: N/A   Occupational History  . Not on file.   Social History Main Topics  . Smoking status: Never Smoker   . Smokeless tobacco: Not on file  . Alcohol Use: No  . Drug Use: No  . Sexual Activity: Not on file   Other Topics Concern  . Not on  file   Social History Narrative    Past Surgical History  Procedure Laterality Date  . Hemorrhoid surgery  2005    [place] Dr Radene Knee     Family History  Problem Relation Age of Onset  . Heart disease Father   . Diabetes Father   . Heart disease Maternal Grandfather   . Heart disease Paternal Grandfather     No Known Allergies  Current Outpatient Prescriptions on File Prior to Visit  Medication Sig Dispense Refill  . butalbital-acetaminophen-caffeine (FIORICET, ESGIC) 50-325-40 MG per tablet Take 1 tablet by mouth every 6 (six) hours as needed for headache. 30 tablet 4  . cholecalciferol (VITAMIN D) 1000 UNITS tablet Take 2,000 Units by mouth daily.    Marland Kitchen glimepiride (AMARYL) 4 MG tablet Take 4 mg by mouth daily with breakfast.    . glucose blood test strip One Touch Ultra strips to use at home to test blood sugars 2 times daily. 100 each 12  . Insulin NPH, Human,, Isophane, (HUMULIN N KWIKPEN) 100 UNIT/ML Kiwkpen Inject 15 Units into the skin at bedtime. 15 mL 11  . Insulin Pen Needle 32G X 4 MM MISC Use with insulin pen to inject into skin. 100 each 2  . sildenafil (REVATIO) 20 MG tablet Take 1-5 tablets as needed for sexual activity 90 tablet 3  . SitaGLIPtin-MetFORMIN HCl (JANUMET XR) 50-1000 MG TB24 Take 1  tablet by mouth daily. 30 tablet 5   No current facility-administered medications on file prior to visit.      Objective:   Physical Exam  Constitutional: He is oriented to person, place, and time. He appears well-developed and well-nourished. No distress.  BP 138/72 mmHg  Pulse 87  Temp(Src) 98.2 F (36.8 C) (Oral)  Resp 14  Ht 5' 10.5" (1.791 m)  Wt 175 lb 6.4 oz (79.561 kg)  BMI 24.80 kg/m2  SpO2 97%   HENT:  Head: Normocephalic and atraumatic.  Right Ear: External ear normal.  Left Ear: External ear normal.  Cardiovascular: Normal rate, regular rhythm, normal heart sounds and intact distal pulses.  Exam reveals no gallop and no friction rub.   No murmur  heard. Pulmonary/Chest: Effort normal and breath sounds normal. No respiratory distress. He has no wheezes. He has no rales. He exhibits no tenderness.  Musculoskeletal: He exhibits tenderness. He exhibits no edema.  Right shoulder decrease ROM with extension and abduction, Left shoulder full ROM, right shoulder tender anterior and superior.   Neurological: He is alert and oriented to person, place, and time.  Skin: Skin is warm and dry. No rash noted. He is not diaphoretic.  Psychiatric: He has a normal mood and affect. His behavior is normal. Judgment and thought content normal.      Assessment & Plan:

## 2014-10-25 NOTE — Progress Notes (Signed)
Pre visit review using our clinic review tool, if applicable. No additional management support is needed unless otherwise documented below in the visit note. 

## 2014-10-26 ENCOUNTER — Other Ambulatory Visit: Payer: Self-pay | Admitting: Nurse Practitioner

## 2014-10-26 ENCOUNTER — Encounter: Payer: Self-pay | Admitting: Nurse Practitioner

## 2014-10-26 MED ORDER — METHOCARBAMOL 750 MG PO TABS: 750.0000 mg | ORAL_TABLET | Freq: Every day | ORAL | Status: DC

## 2014-10-28 DIAGNOSIS — M25511 Pain in right shoulder: Secondary | ICD-10-CM | POA: Insufficient documentation

## 2014-10-28 DIAGNOSIS — E119 Type 2 diabetes mellitus without complications: Secondary | ICD-10-CM | POA: Insufficient documentation

## 2014-10-28 NOTE — Assessment & Plan Note (Signed)
Worsening. Would like another script for robaxin since it was helpful in the past from Ortho. Asked him to consider another visit with Ortho if continues to be painful and restrict ROM. FU in 3 months.

## 2014-10-28 NOTE — Assessment & Plan Note (Signed)
A1c and CMET today. Not due yet for microalbumin yet. Stable. Will continue same regimen until results come back . FU in 3 months

## 2014-10-29 ENCOUNTER — Encounter: Payer: Self-pay | Admitting: Internal Medicine

## 2014-12-04 ENCOUNTER — Other Ambulatory Visit (INDEPENDENT_AMBULATORY_CARE_PROVIDER_SITE_OTHER): Payer: Self-pay | Admitting: Adult Health

## 2014-12-04 ENCOUNTER — Other Ambulatory Visit: Payer: Self-pay | Admitting: Nurse Practitioner

## 2014-12-05 ENCOUNTER — Other Ambulatory Visit: Payer: Self-pay | Admitting: *Deleted

## 2014-12-05 MED ORDER — METHOCARBAMOL 750 MG PO TABS: 750.0000 mg | ORAL_TABLET | Freq: Every day | ORAL | Status: DC

## 2014-12-05 MED ORDER — GLIMEPIRIDE 4 MG PO TABS: 4.0000 mg | ORAL_TABLET | Freq: Every day | ORAL | Status: DC

## 2015-02-01 ENCOUNTER — Encounter: Payer: Self-pay | Admitting: Nurse Practitioner

## 2015-02-05 ENCOUNTER — Encounter: Payer: Self-pay | Admitting: Nurse Practitioner

## 2015-02-05 ENCOUNTER — Ambulatory Visit (INDEPENDENT_AMBULATORY_CARE_PROVIDER_SITE_OTHER): Payer: BLUE CROSS/BLUE SHIELD | Admitting: Nurse Practitioner

## 2015-02-05 VITALS — BP 138/86 | HR 81 | Temp 98.3°F | Resp 12 | Ht 70.0 in | Wt 176.1 lb

## 2015-02-05 DIAGNOSIS — M25511 Pain in right shoulder: Secondary | ICD-10-CM

## 2015-02-05 DIAGNOSIS — E119 Type 2 diabetes mellitus without complications: Secondary | ICD-10-CM

## 2015-02-05 DIAGNOSIS — M79604 Pain in right leg: Secondary | ICD-10-CM

## 2015-02-05 DIAGNOSIS — M79605 Pain in left leg: Secondary | ICD-10-CM

## 2015-02-05 DIAGNOSIS — M545 Low back pain: Secondary | ICD-10-CM | POA: Insufficient documentation

## 2015-02-05 MED ORDER — PREDNISONE 10 MG PO TABS
ORAL_TABLET | ORAL | Status: DC
Start: 2015-02-05 — End: 2015-05-07

## 2015-02-05 MED ORDER — METHOCARBAMOL 750 MG PO TABS: 750.0000 mg | ORAL_TABLET | Freq: Every day | ORAL | Status: DC

## 2015-02-05 NOTE — Progress Notes (Signed)
Subjective:    Patient ID: Jonathan West, male    DOB: 02-10-1961, 54 y.o.   MRN: [id number]  HPI  Jonathan West is a 54 yo male here for a follow up of DM, right shoulder pain, and back pain x 6 weeks.   1) BS 124 this morning, beach last week so it was up to 200 once and upper 100's for a few days. 88-135 most days Stopped soft drinks March 2015   2) Right shoulder pain- robaxin somewhat helpful after taking for awhile  Left shoulder pain newer, not happening as often as it was   3) Back pain and left leg pain- sneeze or cough felt shooting pain to ankle lateral. Started about 2-3 months ago. Had pinched nerve years ago, but no problems since then. "Feels aware of the leg". 2-3/10 all the time and 7-8/10 at worst.   Review of Systems  Constitutional: Negative for fever, chills, diaphoresis and fatigue.  Eyes: Negative for visual disturbance.  Respiratory: Negative for chest tightness, shortness of breath and wheezing.   Cardiovascular: Negative for chest pain, palpitations and leg swelling.  Gastrointestinal: Negative for nausea, vomiting and diarrhea.  Endocrine: Negative for polydipsia, polyphagia and polyuria.  Musculoskeletal: Positive for back pain and arthralgias.  Skin: Negative for rash.  Neurological: Negative for dizziness, weakness and numbness.       Denies losing bowel/bladder function, saddle anesthesia, or weakness of extremities.    Past Medical History  Diagnosis Date  . Diabetes mellitus without complication   . Migraines     History   Social History  . Marital Status: Married    Spouse Name: N/A  . Number of Children: N/A  . Years of Education: N/A   Occupational History  . Not on file.   Social History Main Topics  . Smoking status: Never Smoker   . Smokeless tobacco: Not on file  . Alcohol Use: No  . Drug Use: No  . Sexual Activity: Not on file   Other Topics Concern  . Not on file   Social History Narrative    Past Surgical History    Procedure Laterality Date  . Hemorrhoid surgery  2005    [place] [address] Radene Knee     Family History  Problem Relation Age of Onset  . Heart disease Father   . Diabetes Father   . Heart disease Maternal Grandfather   . Heart disease Paternal Grandfather     Allergies  Allergen Reactions  . Metformin Other (See Comments)    Indigestion.    Current Outpatient Prescriptions on File Prior to Visit  Medication Sig Dispense Refill  . butalbital-acetaminophen-caffeine (FIORICET, ESGIC) 50-325-40 MG per tablet Take 1 tablet by mouth every 6 (six) hours as needed for headache. 30 tablet 4  . cholecalciferol (VITAMIN D) 1000 UNITS tablet Take 2,000 Units by mouth daily.    Marland Kitchen glimepiride (AMARYL) 4 MG tablet Take 1 tablet (4 mg total) by mouth daily with breakfast. 30 tablet 6  . glucose blood test strip One Touch Ultra strips to use at home to test blood sugars 2 times daily. 100 each 12  . Insulin NPH, Human,, Isophane, (HUMULIN N KWIKPEN) 100 UNIT/ML Kiwkpen Inject 15 Units into the skin at bedtime. 15 mL 11  . Insulin Pen Needle 32G X 4 MM MISC Use with insulin pen to inject into skin. 100 each 2  . SitaGLIPtin-MetFORMIN HCl (JANUMET XR) 50-1000 MG TB24 Take 1 tablet by mouth daily. 30 tablet  5  . sildenafil (REVATIO) 20 MG tablet Take 1-5 tablets as needed for sexual activity (Patient not taking: Reported on 02/05/2015) 90 tablet 3   No current facility-administered medications on file prior to visit.      Objective:   Physical Exam  Constitutional: He is oriented to person, place, and time. He appears well-developed and well-nourished. No distress.  BP 138/86 mmHg  Pulse 81  Temp(Src) 98.3 F (36.8 C) (Oral)  Resp 12  Ht 5\' 10"  (1.778 m)  Wt 176 lb 1.9 oz (79.888 kg)  BMI 25.27 kg/m2  SpO2 96%   HENT:  Head: Normocephalic and atraumatic.  Right Ear: External ear normal.  Left Ear: External ear normal.  Cardiovascular: Normal rate, regular rhythm and normal heart sounds.  Exam  reveals no gallop and no friction rub.   No murmur heard. Pulmonary/Chest: Effort normal and breath sounds normal. No respiratory distress. He has no wheezes. He has no rales. He exhibits no tenderness.  Neurological: He is alert and oriented to person, place, and time. He displays normal reflexes.  Iliopsoas 5/5 Bilateral, Tib anterior 5/5 bilateral, EHL 5/5 bilateral, no ankle clonus, intact heel/toe/sequential walking, sensation intact upper and lower extremities. Straight leg raise negative bilaterally.   Skin: Skin is warm and dry. No rash noted. He is not diaphoretic.  Psychiatric: He has a normal mood and affect. His behavior is normal. Judgment and thought content normal.      Assessment & Plan:

## 2015-02-05 NOTE — Patient Instructions (Addendum)
Prednisone with breakfast (or lunch)  6 tablets on day 1, 5 tablets on day 2, 4 tablets on day 3, 3 tablets on day 4, 2 tablets day 5, 1 tablet on day 6...done! Please take the doses together (not spread out throughout the day).  Watch blood sugars, this will elevate them.   MyChart [place] if it was not helpful.   Robaxin also refilled and sent to pharmacy.   Follow up in 3 months for blood sugar, back pain, shoulder pain. We will get labs at next visit.

## 2015-02-05 NOTE — Assessment & Plan Note (Signed)
Robaxin somewhat helpful. Stable, left shoulder now bothering him some as well. Will do prednisone for LBP and may help shoulders. Pt is active and rides motorcycles, asked him to stretch. Will follow up in 3 months.

## 2015-02-05 NOTE — Assessment & Plan Note (Signed)
Worsening. Will start prednisone taper. Discussed watching blood sugar, not taking NSAIDs with this, and some adverse effects. He has taken successfully in the past. Will also continue robaxin. Will follow up in 3 months. Asked him to MyChart me if not improved after the taper.

## 2015-02-05 NOTE — Progress Notes (Signed)
Pre visit review using our clinic review tool, if applicable. No additional management support is needed unless otherwise documented below in the visit note. 

## 2015-02-05 NOTE — Assessment & Plan Note (Signed)
A1c due in August. No labs today, continue monitoring and staying with medications. Will watch because prednisone taper will elevate BS. FU in 3 months.

## 2015-02-07 ENCOUNTER — Encounter: Payer: Self-pay | Admitting: Nurse Practitioner

## 2015-02-07 MED ORDER — BUTALBITAL-APAP-CAFFEINE 50-325-40 MG PO TABS: 1.0000 | ORAL_TABLET | Freq: Four times a day (QID) | ORAL | Status: DC | PRN

## 2015-02-07 NOTE — Telephone Encounter (Signed)
Last visit 02/05/15, sent mychart requesting refill, ok?

## 2015-03-14 ENCOUNTER — Other Ambulatory Visit: Payer: Self-pay | Admitting: Nurse Practitioner

## 2015-03-14 ENCOUNTER — Telehealth: Payer: Self-pay | Admitting: *Deleted

## 2015-03-14 NOTE — Telephone Encounter (Signed)
Fax from pharmacy, needing PA for Janumet XR. PA from [facility] was included in fax. Sent mychart message for more information to complete PA.

## 2015-03-14 NOTE — Telephone Encounter (Signed)
[facility] requesting PA on Janumet ok to refill?

## 2015-03-15 ENCOUNTER — Other Ambulatory Visit: Payer: Self-pay | Admitting: Nurse Practitioner

## 2015-03-15 MED ORDER — SITAGLIP PHOS-METFORMIN HCL ER 50-1000 MG PO TB24: 1.0000 | ORAL_TABLET | Freq: Every day | ORAL | Status: DC

## 2015-03-15 MED ORDER — METHOCARBAMOL 750 MG PO TABS: 750.0000 mg | ORAL_TABLET | Freq: Every day | ORAL | Status: DC

## 2015-03-15 NOTE — Telephone Encounter (Signed)
PA completed online, pending response. 

## 2015-03-27 ENCOUNTER — Other Ambulatory Visit: Payer: Self-pay | Admitting: *Deleted

## 2015-03-27 ENCOUNTER — Telehealth: Payer: Self-pay | Admitting: *Deleted

## 2015-03-27 MED ORDER — SITAGLIP PHOS-METFORMIN HCL ER 50-1000 MG PO TB24: 1.0000 | ORAL_TABLET | Freq: Every day | ORAL | Status: DC

## 2015-03-27 NOTE — Telephone Encounter (Signed)
I called Cover my meds in reference to a PA that was started on 7.7.16 for Janumet.  As per Aniceto Boss, no PA is needed as long as med is filled through [facility]. [phone]  I sent new Rx to [facility].  Burnard Leigh., RN aware.

## 2015-05-06 ENCOUNTER — Encounter: Payer: Self-pay | Admitting: Nurse Practitioner

## 2015-05-07 ENCOUNTER — Ambulatory Visit (INDEPENDENT_AMBULATORY_CARE_PROVIDER_SITE_OTHER): Payer: BLUE CROSS/BLUE SHIELD | Admitting: Nurse Practitioner

## 2015-05-07 VITALS — BP 140/78 | HR 76 | Temp 98.1°F | Resp 18 | Ht 70.0 in | Wt 182.4 lb

## 2015-05-07 DIAGNOSIS — M25511 Pain in right shoulder: Secondary | ICD-10-CM | POA: Diagnosis not present

## 2015-05-07 DIAGNOSIS — M545 Low back pain, unspecified: Secondary | ICD-10-CM

## 2015-05-07 DIAGNOSIS — Z23 Encounter for immunization: Secondary | ICD-10-CM | POA: Diagnosis not present

## 2015-05-07 DIAGNOSIS — Z13220 Encounter for screening for lipoid disorders: Secondary | ICD-10-CM | POA: Diagnosis not present

## 2015-05-07 DIAGNOSIS — M79604 Pain in right leg: Secondary | ICD-10-CM

## 2015-05-07 DIAGNOSIS — E1165 Type 2 diabetes mellitus with hyperglycemia: Secondary | ICD-10-CM

## 2015-05-07 DIAGNOSIS — I10 Essential (primary) hypertension: Secondary | ICD-10-CM | POA: Diagnosis not present

## 2015-05-07 DIAGNOSIS — IMO0002 Reserved for concepts with insufficient information to code with codable children: Secondary | ICD-10-CM

## 2015-05-07 DIAGNOSIS — E1169 Type 2 diabetes mellitus with other specified complication: Secondary | ICD-10-CM

## 2015-05-07 DIAGNOSIS — D179 Benign lipomatous neoplasm, unspecified: Secondary | ICD-10-CM

## 2015-05-07 DIAGNOSIS — N521 Erectile dysfunction due to diseases classified elsewhere: Secondary | ICD-10-CM

## 2015-05-07 LAB — COMPREHENSIVE METABOLIC PANEL
ALT: 16 U/L (ref 0–53)
AST: 15 U/L (ref 0–37)
Albumin: 4.3 g/dL (ref 3.5–5.2)
Alkaline Phosphatase: 87 U/L (ref 39–117)
BUN: 16 mg/dL (ref 6–23)
CALCIUM: 9.1 mg/dL (ref 8.4–10.5)
CHLORIDE: 104 meq/L (ref 96–112)
CO2: 29 meq/L (ref 19–32)
CREATININE: 1.03 mg/dL (ref 0.40–1.50)
GFR: 79.32 mL/min (ref >60.00)
GLUCOSE: 157 mg/dL — AB (ref 70–99)
Potassium: 4.2 mEq/L (ref 3.5–5.1)
SODIUM: 139 meq/L (ref 135–145)
Total Bilirubin: 0.5 mg/dL (ref 0.2–1.2)
Total Protein: 6.9 g/dL (ref 6.0–8.3)

## 2015-05-07 LAB — LIPID PANEL
CHOL/HDL RATIO: 4
CHOLESTEROL: 131 mg/dL (ref 0–200)
HDL: 30.5 mg/dL — ABNORMAL LOW (ref >39.00)
LDL Cholesterol: 78 mg/dL (ref 0–99)
NonHDL: 100.28
Triglycerides: 109 mg/dL (ref 0.0–149.0)
VLDL: 21.8 mg/dL (ref 0.0–40.0)

## 2015-05-07 LAB — CBC WITH DIFFERENTIAL/PLATELET
BASOS PCT: 0.4 % (ref 0.0–3.0)
Basophils Absolute: 0 10*3/uL (ref 0.0–0.1)
EOS ABS: 0.1 10*3/uL (ref 0.0–0.7)
Eosinophils Relative: 2 % (ref 0.0–5.0)
HEMATOCRIT: 47.3 % (ref 39.0–52.0)
Hemoglobin: 15.8 g/dL (ref 13.0–17.0)
LYMPHS PCT: 20.5 % (ref 12.0–46.0)
Lymphs Abs: 1.3 10*3/uL (ref 0.7–4.0)
MCHC: 33.3 g/dL (ref 30.0–36.0)
MCV: 88.6 fl (ref 78.0–100.0)
Monocytes Absolute: 0.6 10*3/uL (ref 0.1–1.0)
Monocytes Relative: 9.3 % (ref 3.0–12.0)
NEUTROS ABS: 4.4 10*3/uL (ref 1.4–7.7)
Neutrophils Relative %: 67.8 % (ref 43.0–77.0)
Platelets: 218 10*3/uL (ref 150.0–400.0)
RBC: 5.34 Mil/uL (ref 4.22–5.81)
RDW: 13.2 % (ref 11.5–15.5)
WBC: 6.5 10*3/uL (ref 4.0–10.5)

## 2015-05-07 LAB — HEMOGLOBIN A1C: Hgb A1c MFr Bld: 7.3 % — ABNORMAL HIGH (ref 4.6–6.5)

## 2015-05-07 MED ORDER — SILDENAFIL CITRATE 50 MG PO TABS: 50.0000 mg | ORAL_TABLET | Freq: Every day | ORAL | Status: DC | PRN

## 2015-05-07 MED ORDER — GABAPENTIN 300 MG PO CAPS: 300.0000 mg | ORAL_CAPSULE | Freq: Every day | ORAL | Status: DC

## 2015-05-07 NOTE — Progress Notes (Signed)
Patient ID: Jonathan West, male    DOB: 21-Jun-1961  Age: 54 y.o. MRN: [id number]  CC: Follow-up   HPI Jonathan West presents for follow up of DM and shoulder pain.  1) Low back pain uncontrolled- Ongoing since Feb. 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   He has past surgical history that includes Hemorrhoid surgery (2005).   His family history includes Diabetes in his father; Heart disease in his father, maternal grandfather, and paternal grandfather.He reports that he has never smoked. He does not have any smokeless tobacco history on file. He reports that he does not drink alcohol or use illicit drugs.  Outpatient Prescriptions Prior to Visit  Medication Sig Dispense Refill  . butalbital-acetaminophen-caffeine (FIORICET, ESGIC) 50-325-40 MG per tablet Take 1 tablet by mouth every 6 (six) hours as needed for headache. 30 tablet 4  . cholecalciferol (VITAMIN D) 1000 UNITS tablet Take 2,000 Units by mouth daily.    Marland Kitchen glimepiride (AMARYL) 4 MG tablet Take 1 tablet (4 mg total) by mouth daily with breakfast. 30 tablet 6  . glucose blood test strip One Touch Ultra strips to use at home to test blood sugars 2 times daily. 100 each 12  . Insulin NPH, Human,, Isophane, (HUMULIN N KWIKPEN) 100 UNIT/ML Kiwkpen Inject 15 Units into the skin at bedtime. 15 mL 11  . Insulin Pen Needle 32G X 4 MM MISC Use with insulin pen to inject into skin. 100 each 2  . methocarbamol (ROBAXIN) 750 MG tablet Take 1 tablet (750 mg total) by mouth daily. 30 tablet 0  . SitaGLIPtin-MetFORMIN HCl (JANUMET XR) 50-1000 MG TB24 Take 1 tablet by  mouth daily. 90 tablet 2  . predniSONE (DELTASONE) 10 MG tablet Take 6 tablets by mouth on day 1 with breakfast then decrease by 1 tablet each day until gone. 21 tablet 0  . sildenafil (REVATIO) 20 MG tablet Take 1-5 tablets as needed for sexual activity 90 tablet 3   No facility-administered medications prior to visit.    ROS Review of Systems  Constitutional: Negative for fever, chills, diaphoresis and fatigue.  Endocrine: Negative for polydipsia, polyphagia and polyuria.  Musculoskeletal: Positive for myalgias, back pain and arthralgias.  Skin: Negative for rash.  Neurological: Negative for dizziness and numbness.       Denies losing bowel/bladder function, saddle anesthesia, or weakness of extremities.   Psychiatric/Behavioral: The patient is not nervous/anxious.     Objective:  BP 140/78 mmHg  Pulse 76  Temp(Src) 98.1 F (36.7 C)  Resp 18  Ht 5\' 10"  (1.778 m)  Wt 182 lb 6.4 oz (82.736 kg)  BMI 26.17 kg/m2  SpO2 97%  Physical Exam  Constitutional: He is oriented to person, place, and time. He appears well-developed and well-nourished. No distress.  HENT:  Head: Normocephalic and atraumatic.  Right Ear: External ear normal.  Left Ear: External ear normal.  Cardiovascular: Normal rate, regular rhythm and normal heart sounds.  Exam reveals no gallop and no friction rub.   No murmur heard. Pulmonary/Chest: Effort normal and breath sounds  normal. No respiratory distress. He has no wheezes. He has no rales. He exhibits no tenderness.  Neurological: He is alert and oriented to person, place, and time.  Iliopsoas 5/5 Bilateral, Tib anterior 5/5 bilateral, EHL 5/5 bilateral, no ankle clonus, intact heel/toe/sequential walking, sensation intact upper and lower extremities. Straight leg raise negative bilaterally.   Skin: Skin is warm and dry. No rash noted. He is not diaphoretic.  Psychiatric: He has a normal mood and affect. His behavior is normal. Judgment and thought content  normal.   Assessment & Plan:   Dawsyn was seen today for follow-up.  Diagnoses and all orders for this visit:  DM (diabetes mellitus), type 2, uncontrolled -     CBC with Differential/Platelet -     Hemoglobin A1c  Essential hypertension -     Comprehensive metabolic panel  Lipid screening -     Lipid panel  Encounter for immunization  Other orders -     gabapentin (NEURONTIN) 300 MG capsule; Take 1 capsule (300 mg total) by mouth at bedtime. -     sildenafil (VIAGRA) 50 MG tablet; Take 1 tablet (50 mg total) by mouth daily as needed for erectile dysfunction. -     Flu Vaccine QUAD 36+ mos IM   I have discontinued Mr. Luft's sildenafil and predniSONE. I am also having him start on gabapentin and sildenafil. Additionally, I am having him maintain his glucose blood, cholecalciferol, Insulin NPH (Human) (Isophane), Insulin Pen Needle, glimepiride, butalbital-acetaminophen-caffeine, methocarbamol, and SitaGLIPtin-MetFORMIN HCl.  Meds ordered this encounter  Medications  . gabapentin (NEURONTIN) 300 MG capsule    Sig: Take 1 capsule (300 mg total) by mouth at bedtime.    Dispense:  90 capsule    Refill:  0    Order Specific Question:  Supervising Provider    Answer:  Deborra Medina L [[id number]]  . sildenafil (VIAGRA) 50 MG tablet    Sig: Take 1 tablet (50 mg total) by mouth daily as needed for erectile dysfunction.    Dispense:  10 tablet    Refill:  0    Order Specific Question:  Supervising Provider    Answer:  Crecencio Mc [[id number]]     Follow-up: Return in about 3 months (around 08/07/2015).

## 2015-05-07 NOTE — Patient Instructions (Signed)
Try the savings card  Gabapentin 300 mg at night (try for 1 month)   Call ENT to set up your appointment   Make an Eye appointment for check up

## 2015-05-07 NOTE — Progress Notes (Signed)
Pre visit review using our clinic review tool, if applicable. No additional management support is needed unless otherwise documented below in the visit note. 

## 2015-05-22 ENCOUNTER — Encounter: Payer: Self-pay | Admitting: Nurse Practitioner

## 2015-05-22 DIAGNOSIS — D179 Benign lipomatous neoplasm, unspecified: Secondary | ICD-10-CM | POA: Insufficient documentation

## 2015-05-22 NOTE — Assessment & Plan Note (Signed)
BP Readings from Last 3 Encounters:  05/07/15 140/78  02/05/15 138/86  10/25/14 138/72   Stable. Continue regimen.

## 2015-05-22 NOTE — Assessment & Plan Note (Signed)
Pt has ENT doctor. Asked pt to consult for removal since it is on face/neck areas. 2.5 cm x 2.5 cm.

## 2015-05-22 NOTE — Assessment & Plan Note (Signed)
Will try Viagra with savings card. Pt reports others are too expensive

## 2015-05-22 NOTE — Assessment & Plan Note (Signed)
Will try Gabapentin for pain control. FU in 3 months.

## 2015-05-22 NOTE — Assessment & Plan Note (Signed)
Resolved

## 2015-05-22 NOTE — Assessment & Plan Note (Signed)
Will obtain CBC w/ diff and A1c. Controlled mostly, but high due to vacation recently and not watching diet.

## 2015-07-01 ENCOUNTER — Other Ambulatory Visit: Payer: Self-pay | Admitting: Nurse Practitioner

## 2015-07-16 ENCOUNTER — Other Ambulatory Visit: Payer: Self-pay | Admitting: Nurse Practitioner

## 2015-07-23 ENCOUNTER — Other Ambulatory Visit: Payer: Self-pay | Admitting: Otolaryngology

## 2015-07-23 DIAGNOSIS — R221 Localized swelling, mass and lump, neck: Secondary | ICD-10-CM

## 2015-07-30 ENCOUNTER — Ambulatory Visit
Admission: RE | Admit: 2015-07-30 | Discharge: 2015-07-30 | Disposition: A | Discharge status: Home / Self Care | Payer: BLUE CROSS/BLUE SHIELD | Source: Ambulatory Visit | Attending: Otolaryngology | Admitting: Otolaryngology

## 2015-07-30 DIAGNOSIS — R221 Localized swelling, mass and lump, neck: Secondary | ICD-10-CM | POA: Diagnosis not present

## 2015-08-06 ENCOUNTER — Encounter: Payer: Self-pay | Admitting: Nurse Practitioner

## 2015-08-06 ENCOUNTER — Ambulatory Visit (INDEPENDENT_AMBULATORY_CARE_PROVIDER_SITE_OTHER): Payer: BLUE CROSS/BLUE SHIELD | Admitting: Nurse Practitioner

## 2015-08-06 ENCOUNTER — Ambulatory Visit: Payer: BLUE CROSS/BLUE SHIELD | Admitting: Nurse Practitioner

## 2015-08-06 VITALS — BP 142/78 | HR 81 | Temp 97.7°F | Wt 181.0 lb

## 2015-08-06 DIAGNOSIS — E119 Type 2 diabetes mellitus without complications: Secondary | ICD-10-CM | POA: Diagnosis not present

## 2015-08-06 DIAGNOSIS — I10 Essential (primary) hypertension: Secondary | ICD-10-CM

## 2015-08-06 MED ORDER — METHOCARBAMOL 750 MG PO TABS: 750.0000 mg | ORAL_TABLET | Freq: Every day | ORAL | Status: DC

## 2015-08-06 MED ORDER — BUTALBITAL-APAP-CAFFEINE 50-325-40 MG PO TABS: 1.0000 | ORAL_TABLET | Freq: Four times a day (QID) | ORAL | Status: DC | PRN

## 2015-08-06 MED ORDER — PROPRANOLOL HCL 40 MG PO TABS: 40.0000 mg | ORAL_TABLET | Freq: Two times a day (BID) | ORAL | Status: DC

## 2015-08-06 MED ORDER — GABAPENTIN 300 MG PO CAPS: ORAL_CAPSULE | ORAL | Status: DC

## 2015-08-06 MED ORDER — INSULIN ISOPHANE HUMAN 100 UNIT/ML KWIKPEN: 15.0000 [IU] | PEN_INJECTOR | Freq: Every day | SUBCUTANEOUS | Status: DC

## 2015-08-06 NOTE — Progress Notes (Signed)
Patient ID: Jonathan West, male    DOB: 03/12/1961  Age: 54 y.o. MRN: [id number]  CC: Follow-up   HPI Jonathan West presents for follow up of Essential HTN, DM Type II, and medication refills/concerns.   1) Pt reports needing refills on various medications, but need them at 2 different pharmacies. Concerned about cost of Janumet from [facility]. They sent him a list of alternatives.   2) Pt reports his BP is creeping upwards and he was on propranolol in past with good results. He does have migraines and this maybe helpful for both.   History Jonathan West has a past medical history of Diabetes mellitus without complication and Migraines.   He has past surgical history that includes Hemorrhoid surgery (2005).   His family history includes Diabetes in his father; Heart disease in his father, maternal grandfather, and paternal grandfather.He reports that he has never smoked. He does not have any smokeless tobacco history on file. He reports that he does not drink alcohol or use illicit drugs.  Outpatient Prescriptions Prior to Visit  Medication Sig Dispense Refill  . cholecalciferol (VITAMIN D) 1000 UNITS tablet Take 2,000 Units by mouth daily.    Marland Kitchen glimepiride (AMARYL) 4 MG tablet TAKE ONE TABLET BY MOUTH ONCE DAILY WITH BREAKFAST 30 tablet 3  . glucose blood test strip One Touch Ultra strips to use at home to test blood sugars 2 times daily. 100 each 12  . Insulin Pen Needle 32G X 4 MM MISC Use with insulin pen to inject into skin. 100 each 2  . sildenafil (VIAGRA) 50 MG tablet Take 1 tablet (50 mg total) by mouth daily as needed for erectile dysfunction. 10 tablet 0  . butalbital-acetaminophen-caffeine (FIORICET, ESGIC) 50-325-40 MG per tablet Take 1 tablet by mouth every 6 (six) hours as needed for headache. 30 tablet 4  . gabapentin (NEURONTIN) 300 MG capsule Take 1 capsule by mouth at  bedtime 90 capsule 0  . Insulin NPH, Human,, Isophane, (HUMULIN N KWIKPEN) 100 UNIT/ML Kiwkpen Inject 15  Units into the skin at bedtime. 15 mL 11  . SitaGLIPtin-MetFORMIN HCl (JANUMET XR) 50-1000 MG TB24 Take 1 tablet by mouth daily. 90 tablet 2  . methocarbamol (ROBAXIN) 750 MG tablet Take 1 tablet (750 mg total) by mouth daily. (Patient not taking: Reported on 08/06/2015) 30 tablet 0   No facility-administered medications prior to visit.    ROS Review of Systems  Constitutional: Negative for fever, chills, diaphoresis and fatigue.  HENT: Negative for tinnitus and trouble swallowing.   Eyes: Negative for visual disturbance.  Respiratory: Negative for chest tightness, shortness of breath and wheezing.   Cardiovascular: Negative for chest pain, palpitations and leg swelling.  Gastrointestinal: Negative for nausea, vomiting and diarrhea.  Endocrine: Negative for polydipsia, polyphagia and polyuria.  Skin: Negative for color change, rash and wound.  Neurological: Positive for headaches.    Objective:  BP 142/78 mmHg  Pulse 81  Temp(Src) 97.7 F (36.5 C) (Oral)  Wt 181 lb (82.101 kg)  SpO2 95%  Physical Exam  Constitutional: He is oriented to person, place, and time. He appears well-developed and well-nourished. No distress.  HENT:  Head: Normocephalic and atraumatic.  Right Ear: External ear normal.  Left Ear: External ear normal.  Cardiovascular: Normal rate, regular rhythm and normal heart sounds.  Exam reveals no gallop and no friction rub.   No murmur heard. Pulmonary/Chest: Effort normal and breath sounds normal. No respiratory distress. He has no wheezes. He has no  rales. He exhibits no tenderness.  Neurological: He is alert and oriented to person, place, and time.  Skin: Skin is warm and dry. No rash noted. He is not diaphoretic.  Psychiatric: He has a normal mood and affect. His behavior is normal. Judgment and thought content normal.   Assessment & Plan:   Jonathan West was seen today for follow-up.  Diagnoses and all orders for this visit:  Essential  hypertension  Diabetes mellitus without complication ([place])  Other orders -     Insulin NPH, Human,, Isophane, (HUMULIN N KWIKPEN) 100 UNIT/ML Kiwkpen; Inject 15 Units into the skin at bedtime. -     methocarbamol (ROBAXIN) 750 MG tablet; Take 1 tablet (750 mg total) by mouth daily. -     Discontinue: gabapentin (NEURONTIN) 300 MG capsule; Take 1 capsule by mouth at bedtime -     butalbital-acetaminophen-caffeine (FIORICET, ESGIC) 50-325-40 MG tablet; Take 1 tablet by mouth every 6 (six) hours as needed for headache. -     propranolol (INDERAL) 40 MG tablet; Take 1 tablet (40 mg total) by mouth 2 (two) times daily.  I have discontinued Jonathan West's gabapentin. I have also changed his butalbital-acetaminophen-caffeine. Additionally, I am having him start on propranolol. Lastly, I am having him maintain his glucose blood, cholecalciferol, Insulin Pen Needle, sildenafil, glimepiride, Insulin NPH (Human) (Isophane), and methocarbamol.  Meds ordered this encounter  Medications  . Insulin NPH, Human,, Isophane, (HUMULIN N KWIKPEN) 100 UNIT/ML Kiwkpen    Sig: Inject 15 Units into the skin at bedtime.    Dispense:  15 mL    Refill:  11    Order Specific Question:  Supervising Provider    Answer:  Deborra Medina L [[id number]]  . methocarbamol (ROBAXIN) 750 MG tablet    Sig: Take 1 tablet (750 mg total) by mouth daily.    Dispense:  30 tablet    Refill:  0    Order Specific Question:  Supervising Provider    Answer:  Deborra Medina L [[id number]]  . DISCONTD: gabapentin (NEURONTIN) 300 MG capsule    Sig: Take 1 capsule by mouth at bedtime    Dispense:  180 capsule    Refill:  0    Order Specific Question:  Supervising Provider    Answer:  Deborra Medina L [[id number]]  . butalbital-acetaminophen-caffeine (FIORICET, ESGIC) 50-325-40 MG tablet    Sig: Take 1 tablet by mouth every 6 (six) hours as needed for headache.    Dispense:  90 tablet    Refill:  1    Order Specific Question:  Supervising Provider     Answer:  Deborra Medina L [[id number]]  . propranolol (INDERAL) 40 MG tablet    Sig: Take 1 tablet (40 mg total) by mouth 2 (two) times daily.    Dispense:  60 tablet    Refill:  0    Order Specific Question:  Supervising Provider    Answer:  Crecencio Mc [[id number]]     Follow-up: Return in about 3 months (around 11/05/2015) for Follow up.

## 2015-08-06 NOTE — Patient Instructions (Signed)
MyChart me the blood pressure from Dr. Darien Ramus office next Tuesday please.   Follow up in 3 months.

## 2015-08-07 ENCOUNTER — Other Ambulatory Visit: Payer: Self-pay | Admitting: Nurse Practitioner

## 2015-08-07 ENCOUNTER — Ambulatory Visit: Payer: BLUE CROSS/BLUE SHIELD | Admitting: Nurse Practitioner

## 2015-08-07 MED ORDER — METFORMIN HCL ER (MOD) 1000 MG PO TB24: 1000.0000 mg | ORAL_TABLET | Freq: Every day | ORAL | Status: DC

## 2015-08-07 MED ORDER — SITAGLIPTIN PHOSPHATE 50 MG PO TABS: 50.0000 mg | ORAL_TABLET | Freq: Every day | ORAL | Status: DC

## 2015-08-07 MED ORDER — GABAPENTIN 300 MG PO CAPS: ORAL_CAPSULE | ORAL | Status: DC

## 2015-08-11 ENCOUNTER — Encounter: Payer: Self-pay | Admitting: Nurse Practitioner

## 2015-08-11 NOTE — Assessment & Plan Note (Signed)
BP Readings from Last 3 Encounters:  08/06/15 142/78  05/07/15 140/78  02/05/15 138/86   Pt is creeping back up and he was concerned. Previously on propranolol 40 mg twice daily. Will re-start this medication, which may also help his migraines. He is agreeable and advised not to stop suddenly. He is to send me his BP reading from Dr. Darien Ramus office in lieu of an office visit for BP check. This was agreeable and will be via MyChart.

## 2015-08-11 NOTE — Assessment & Plan Note (Signed)
Reports concerns about medications today. Discussed options for alternatives to Janumet including separating components, which was something he was interested in seeing if this is cost effective. Called in requesting medications to the requested pharmacies. Will follow up in 3 months.   Lab Results  Component Value Date   HGBA1C 7.3* 05/07/2015   HGBA1C 6.9* 10/25/2014   HGBA1C 8.0* 07/25/2014   Lab Results  Component Value Date   MICROALBUR 1.1 07/25/2014   LDLCALC 78 05/07/2015   CREATININE 1.03 05/07/2015

## 2015-08-12 ENCOUNTER — Encounter: Payer: Self-pay | Admitting: Nurse Practitioner

## 2015-08-14 ENCOUNTER — Encounter: Payer: Self-pay | Admitting: Nurse Practitioner

## 2015-08-15 ENCOUNTER — Encounter
Admission: RE | Admit: 2015-08-15 | Discharge: 2015-08-15 | Disposition: A | Discharge status: Home / Self Care | Payer: BLUE CROSS/BLUE SHIELD | Source: Ambulatory Visit | Attending: Otolaryngology | Admitting: Otolaryngology

## 2015-08-15 DIAGNOSIS — Z01810 Encounter for preprocedural cardiovascular examination: Secondary | ICD-10-CM | POA: Insufficient documentation

## 2015-08-15 DIAGNOSIS — Z01812 Encounter for preprocedural laboratory examination: Secondary | ICD-10-CM | POA: Insufficient documentation

## 2015-08-15 HISTORY — DX: Essential (primary) hypertension: I10

## 2015-08-15 HISTORY — DX: Other complications of anesthesia, initial encounter: T88.59XA

## 2015-08-15 HISTORY — DX: Adverse effect of unspecified anesthetic, initial encounter: T41.45XA

## 2015-08-15 LAB — BASIC METABOLIC PANEL
ANION GAP: 6 (ref 5–15)
BUN: 16 mg/dL (ref 6–20)
CHLORIDE: 106 mmol/L (ref 101–111)
CO2: 28 mmol/L (ref 22–32)
Calcium: 9 mg/dL (ref 8.9–10.3)
Creatinine, Ser: 1.06 mg/dL (ref 0.61–1.24)
GFR calc Af Amer: >60 mL/min (ref >60)
GFR calc non Af Amer: >60 mL/min (ref >60)
GLUCOSE: 214 mg/dL — AB (ref 65–99)
POTASSIUM: 4 mmol/L (ref 3.5–5.1)
Sodium: 140 mmol/L (ref 135–145)

## 2015-08-15 NOTE — Patient Instructions (Signed)
  Your procedure is scheduled on: Thursday 09/12/2015 Report to Day Surgery. 2ND FLOOR MEDICAL MALL ENTRANCE To find out your arrival time please call [phone] between 1PM - 3PM on Wednesday 09/11/2015.  Remember: Instructions that are not followed completely may result in serious medical risk, up to and including death, or upon the discretion of your surgeon and anesthesiologist your surgery may need to be rescheduled.    __X__ 1. Do not eat food or drink liquids after midnight. No gum chewing or hard candies.     __X__ 2. No Alcohol for 24 hours before or after surgery.   ____ 3. Bring all medications with you on the day of surgery if instructed.    __X__ 4. Notify your doctor if there is any change in your medical condition     (cold, fever, infections).     Do not wear jewelry, make-up, hairpins, clips or nail polish.  Do not wear lotions, powders, or perfumes.   Do not shave 48 hours prior to surgery. Men may shave face and neck.  Do not bring valuables to the hospital.    [facility] is not responsible for any belongings or valuables.               Contacts, dentures or bridgework may not be worn into surgery.  Leave your suitcase in the car. After surgery it may be brought to your room.  For patients admitted to the hospital, discharge time is determined by your                treatment team.   Patients discharged the day of surgery will not be allowed to drive home.   Please read over the following fact sheets that you were given:   Surgical Site Infection Prevention   __X__ Take these medicines the morning of surgery with A SIP OF WATER:    1. GABAPENTIN  2. PROPRANOLOL  3.   4.  5.  6.  ____ Fleet Enema (as directed)   __X__ Use CHG Soap as directed  ____ Use inhalers on the day of surgery  __X__ Stop metformin 2 days prior to surgery    __X__ Take 1/2 of usual insulin dose the night before surgery and none on the morning of surgery.  TAKE 7.5 UNITS  ____  Stop Coumadin/Plavix/aspirin on   ____ Stop Anti-inflammatories on    ____ Stop supplements until after surgery.    ____ Bring C-Pap to the hospital.

## 2015-08-28 ENCOUNTER — Other Ambulatory Visit: Payer: Self-pay

## 2015-08-28 ENCOUNTER — Telehealth: Payer: Self-pay | Admitting: Nurse Practitioner

## 2015-08-28 NOTE — Telephone Encounter (Signed)
Pt called about only being sent 90 pills and should have been 180 pills. Medication is gabapentin (NEURONTIN) 300 MG capsule and Insulin Pen Needle 32G X 4 MM MISC 90day supply. Pharmacy is [facility], [place] [address]. Thank You!

## 2015-08-28 NOTE — Telephone Encounter (Signed)
This was done by the provider on the 30th of November.

## 2015-09-04 ENCOUNTER — Encounter: Payer: Self-pay | Admitting: Nurse Practitioner

## 2015-09-04 ENCOUNTER — Other Ambulatory Visit: Payer: Self-pay | Admitting: Nurse Practitioner

## 2015-09-04 ENCOUNTER — Other Ambulatory Visit: Payer: Self-pay

## 2015-09-04 MED ORDER — PROPRANOLOL HCL 40 MG PO TABS: 40.0000 mg | ORAL_TABLET | Freq: Two times a day (BID) | ORAL | Status: DC

## 2015-09-04 MED ORDER — INSULIN PEN NEEDLE 32G X 4 MM MISC: Status: DC

## 2015-09-04 MED ORDER — METHOCARBAMOL 750 MG PO TABS: 750.0000 mg | ORAL_TABLET | Freq: Every day | ORAL | Status: DC

## 2015-09-04 MED ORDER — GABAPENTIN 300 MG PO CAPS: ORAL_CAPSULE | ORAL | Status: DC

## 2015-09-04 NOTE — Telephone Encounter (Signed)
Please advise refills.  Thanks

## 2015-09-10 ENCOUNTER — Other Ambulatory Visit: Payer: Self-pay

## 2015-09-10 ENCOUNTER — Telehealth: Payer: Self-pay | Admitting: *Deleted

## 2015-09-10 ENCOUNTER — Telehealth: Payer: Self-pay | Admitting: Nurse Practitioner

## 2015-09-10 MED ORDER — INSULIN PEN NEEDLE 32G X 4 MM MISC: Status: DC

## 2015-09-10 NOTE — Telephone Encounter (Signed)
See prior note from patient today.

## 2015-09-10 NOTE — Telephone Encounter (Signed)
Spoke with [facility]ist at [facility]  And clarified order.  Confirmed that he can get a small supply at the local [facility] until mail order arrives.  [facility] needles [facility].

## 2015-09-10 NOTE — Telephone Encounter (Signed)
Pt called about his Insulin Pen Needle 32G X 4 MM MISC. There is a problem with instructions. The mail order needs to have instructions. Pt wants you to call [facility] at [phone] to explain how many needles pt needs. Pt will run out of needs before receiving through mail order. Is there any way he can get some from our office or call in a prescription to [facility].  Please call cell phone. Pt needs about 5 to 8 needles.  Thank you.

## 2015-09-10 NOTE — Telephone Encounter (Signed)
Patient stated that he has a Rx sent over to [facility]  for pin needles, however they need instruction "use one pin needle a day" before they will ship him the needles. Patient also stated that he would need a 10 day supply sent [facility] to [facility] pharmacy while this order is in process. Please advise [facility] - [phone]

## 2015-09-11 ENCOUNTER — Encounter: Payer: Self-pay | Admitting: Nurse Practitioner

## 2015-09-12 ENCOUNTER — Ambulatory Visit
Admission: RE | Admit: 2015-09-12 | Discharge: 2015-09-12 | Disposition: A | Discharge status: Home / Self Care | Payer: BLUE CROSS/BLUE SHIELD | Source: Ambulatory Visit | Attending: Otolaryngology | Admitting: Otolaryngology

## 2015-09-12 ENCOUNTER — Ambulatory Visit: Payer: BLUE CROSS/BLUE SHIELD | Admitting: Registered Nurse

## 2015-09-12 ENCOUNTER — Encounter
Admission: RE | Disposition: A | Discharge status: Home / Self Care | Payer: Self-pay | Source: Ambulatory Visit | Attending: Otolaryngology

## 2015-09-12 DIAGNOSIS — R221 Localized swelling, mass and lump, neck: Secondary | ICD-10-CM | POA: Diagnosis present

## 2015-09-12 DIAGNOSIS — E119 Type 2 diabetes mellitus without complications: Secondary | ICD-10-CM | POA: Diagnosis not present

## 2015-09-12 DIAGNOSIS — G43909 Migraine, unspecified, not intractable, without status migrainosus: Secondary | ICD-10-CM | POA: Insufficient documentation

## 2015-09-12 DIAGNOSIS — Z7984 Long term (current) use of oral hypoglycemic drugs: Secondary | ICD-10-CM | POA: Insufficient documentation

## 2015-09-12 DIAGNOSIS — D179 Benign lipomatous neoplasm, unspecified: Secondary | ICD-10-CM | POA: Insufficient documentation

## 2015-09-12 DIAGNOSIS — I10 Essential (primary) hypertension: Secondary | ICD-10-CM | POA: Diagnosis not present

## 2015-09-12 HISTORY — PX: EXCISION MASS NECK: SHX6703

## 2015-09-12 LAB — GLUCOSE, CAPILLARY
Glucose-Capillary: 166 mg/dL — ABNORMAL HIGH (ref 65–99)
Glucose-Capillary: 183 mg/dL — ABNORMAL HIGH (ref 65–99)

## 2015-09-12 SURGERY — EXCISION, MASS, NECK
Anesthesia: General | Site: Neck | Laterality: Left | Wound class: Clean

## 2015-09-12 MED ORDER — LIDOCAINE-EPINEPHRINE 1 %-1:100000 IJ SOLN
INTRAMUSCULAR | Status: DC | PRN
  Administered 2015-09-12: 3 mL

## 2015-09-12 MED ORDER — EPHEDRINE SULFATE 50 MG/ML IJ SOLN
INTRAMUSCULAR | Status: DC | PRN
  Administered 2015-09-12 (×2): 10 mg via INTRAVENOUS
  Administered 2015-09-12: 15 mg via INTRAVENOUS

## 2015-09-12 MED ORDER — ROCURONIUM BROMIDE 100 MG/10ML IV SOLN
INTRAVENOUS | Status: DC | PRN
  Administered 2015-09-12: 5 mg via INTRAVENOUS

## 2015-09-12 MED ORDER — MIDAZOLAM HCL 2 MG/2ML IJ SOLN
INTRAMUSCULAR | Status: DC | PRN
  Administered 2015-09-12: 2 mg via INTRAVENOUS

## 2015-09-12 MED ORDER — GLYCOPYRROLATE 0.2 MG/ML IJ SOLN
INTRAMUSCULAR | Status: DC | PRN
  Administered 2015-09-12: 0.2 mg via INTRAVENOUS

## 2015-09-12 MED ORDER — FENTANYL CITRATE (PF) 100 MCG/2ML IJ SOLN
INTRAMUSCULAR | Status: DC | PRN
  Administered 2015-09-12: 100 ug via INTRAVENOUS

## 2015-09-12 MED ORDER — ONDANSETRON HCL 4 MG/2ML IJ SOLN
INTRAMUSCULAR | Status: DC | PRN
  Administered 2015-09-12: 4 mg via INTRAVENOUS

## 2015-09-12 MED ORDER — PROMETHAZINE HCL 12.5 MG PO TABS: 12.5000 mg | ORAL_TABLET | Freq: Four times a day (QID) | ORAL | Status: DC | PRN

## 2015-09-12 MED ORDER — FENTANYL CITRATE (PF) 100 MCG/2ML IJ SOLN: 25.0000 ug | INTRAMUSCULAR | Status: DC | PRN

## 2015-09-12 MED ORDER — FAMOTIDINE 20 MG PO TABS
20.0000 mg | ORAL_TABLET | Freq: Once | ORAL | Status: AC
  Administered 2015-09-12: 20 mg via ORAL

## 2015-09-12 MED ORDER — SODIUM CHLORIDE 0.9 % IV SOLN
INTRAVENOUS | Status: DC
Start: 2015-09-12 — End: 2015-09-12
  Administered 2015-09-12: 08:00:00 via INTRAVENOUS

## 2015-09-12 MED ORDER — SUCCINYLCHOLINE CHLORIDE 20 MG/ML IJ SOLN
INTRAMUSCULAR | Status: DC | PRN
  Administered 2015-09-12: 85 mg via INTRAVENOUS

## 2015-09-12 MED ORDER — HYDROCODONE-ACETAMINOPHEN 5-325 MG PO TABS: 1.0000 | ORAL_TABLET | ORAL | Status: DC | PRN

## 2015-09-12 MED ORDER — BACITRACIN ZINC 500 UNIT/GM EX OINT
TOPICAL_OINTMENT | CUTANEOUS | Status: AC
  Filled 2015-09-12: qty 28.35

## 2015-09-12 MED ORDER — PROPOFOL 10 MG/ML IV BOLUS
INTRAVENOUS | Status: DC | PRN
  Administered 2015-09-12: 10 mg via INTRAVENOUS

## 2015-09-12 MED ORDER — PROMETHAZINE HCL 25 MG/ML IJ SOLN
6.2500 mg | INTRAMUSCULAR | Status: DC | PRN
Start: 2015-09-12 — End: 2015-09-12

## 2015-09-12 MED ORDER — LIDOCAINE HCL (CARDIAC) 20 MG/ML IV SOLN
INTRAVENOUS | Status: DC | PRN
Start: 2015-09-12 — End: 2015-09-12
  Administered 2015-09-12: 100 mg via INTRAVENOUS

## 2015-09-12 MED ORDER — DEXAMETHASONE SODIUM PHOSPHATE 10 MG/ML IJ SOLN
INTRAMUSCULAR | Status: DC | PRN
  Administered 2015-09-12: 10 mg via INTRAVENOUS

## 2015-09-12 MED ORDER — LIDOCAINE-EPINEPHRINE 1 %-1:100000 IJ SOLN
INTRAMUSCULAR | Status: AC
  Filled 2015-09-12: qty 1

## 2015-09-12 MED ORDER — FAMOTIDINE 20 MG PO TABS
ORAL_TABLET | ORAL | Status: AC
  Administered 2015-09-12: 20 mg via ORAL
  Filled 2015-09-12: qty 1

## 2015-09-12 SURGICAL SUPPLY — 31 items
BLADE SURG 15 STRL LF DISP TIS (BLADE) ×1 IMPLANT
BLADE SURG 15 STRL SS (BLADE) ×2
CANISTER SUCT 1200ML W/VALVE (MISCELLANEOUS) ×3 IMPLANT
CLOSURE WOUND 1/4X4 (GAUZE/BANDAGES/DRESSINGS) ×1
CORD BIP STRL DISP 12FT (MISCELLANEOUS) ×3 IMPLANT
DRSG TEGADERM 2-3/8X2-3/4 SM (GAUZE/BANDAGES/DRESSINGS) ×3 IMPLANT
ELECT CAUTERY BLADE TIP 2.5 (TIP) ×3
ELECT NEEDLE 20X.3 GREEN (MISCELLANEOUS)
ELECTRODE CAUTERY BLDE TIP 2.5 (TIP) ×1 IMPLANT
ELECTRODE NEEDLE 20X.3 GREEN (MISCELLANEOUS) IMPLANT
FORCEPS JEWEL BIP 4-3/4 STR (INSTRUMENTS) ×3 IMPLANT
GLOVE BIO SURGEON STRL SZ7.5 (GLOVE) ×6 IMPLANT
GOWN STRL REUS W/ TWL LRG LVL3 (GOWN DISPOSABLE) ×2 IMPLANT
GOWN STRL REUS W/TWL LRG LVL3 (GOWN DISPOSABLE) ×4
HARMONIC SCALPEL FOCUS (MISCELLANEOUS) ×3 IMPLANT
KIT RM TURNOVER STRD PROC AR (KITS) ×3 IMPLANT
LABEL OR SOLS (LABEL) ×3 IMPLANT
LIQUID BAND (GAUZE/BANDAGES/DRESSINGS) ×3 IMPLANT
NS IRRIG 500ML POUR BTL (IV SOLUTION) ×3 IMPLANT
PACK HEAD/NECK (MISCELLANEOUS) ×3 IMPLANT
PAD GROUND ADULT SPLIT (MISCELLANEOUS) ×3 IMPLANT
PROBE MONO 100X0.75 ELECT 1.9M (MISCELLANEOUS) ×3 IMPLANT
SPONGE KITTNER 5P (MISCELLANEOUS) ×3 IMPLANT
STRIP CLOSURE SKIN 1/4X4 (GAUZE/BANDAGES/DRESSINGS) ×2 IMPLANT
SUCTION FRAZIER HANDLE 10FR (MISCELLANEOUS) ×2
SUCTION TUBE FRAZIER 10FR DISP (MISCELLANEOUS) ×1 IMPLANT
SUT SILK 2 0 (SUTURE) ×2
SUT SILK 2-0 30XBRD TIE 12 (SUTURE) ×1 IMPLANT
SUT VIC AB 4-0 RB1 18 (SUTURE) ×3 IMPLANT
SYR 3ML LL SCALE MARK (SYRINGE) ×3 IMPLANT
SYSTEM CHEST DRAIN TLS 7FR (DRAIN) IMPLANT

## 2015-09-12 NOTE — Transfer of Care (Signed)
Immediate Anesthesia Transfer of Care Note  Patient: Jonathan West  Procedure(s) Performed: Procedure(s): EXCISION MASS NECK (Left)  Patient Location: PACU  Anesthesia Type:General  Level of Consciousness: sedated  Airway & Oxygen Therapy: Patient Spontanous Breathing and Patient connected to face mask oxygen  Post-op Assessment: Report given to RN and Post -op Vital signs reviewed and stable  Post vital signs: Reviewed and stable  Last Vitals:  Filed Vitals:   09/12/15 0716 09/12/15 0801  BP: 145/102 166/105  Pulse: 71   Temp: 36.5 C   Resp: 16     Complications: No apparent anesthesia complications

## 2015-09-12 NOTE — Anesthesia Procedure Notes (Signed)
Procedure Name: LMA Insertion Date/Time: 09/12/2015 8:57 AM Performed by: Doreen Salvage Pre-anesthesia Checklist: Patient identified, Patient being monitored, Timeout performed, Emergency Drugs available and Suction available Patient Re-evaluated:Patient Re-evaluated prior to inductionOxygen Delivery Method: Circle system utilized Preoxygenation: Pre-oxygenation with 100% oxygen Intubation Type: IV induction Ventilation: Mask ventilation without difficulty LMA: LMA inserted LMA Size: 4.0 Tube type: Oral Number of attempts: 1 Placement Confirmation: positive ETCO2 and breath sounds checked- equal and bilateral Tube secured with: Tape Dental Injury: Teeth and Oropharynx as per pre-operative assessment

## 2015-09-12 NOTE — Anesthesia Preprocedure Evaluation (Signed)
Anesthesia Evaluation  Patient identified by MRN, date of birth, ID band Patient awake    Reviewed: Allergy & Precautions, H&P , NPO status , Patient's Chart, lab work & pertinent test results, reviewed documented beta blocker date and time   History of Anesthesia Complications Negative for: history of anesthetic complications  Airway Mallampati: IV  TM Distance: >3 FB Neck ROM: full    Dental no notable dental hx. (+) Caps, Chipped   Pulmonary neg pulmonary ROS,    Pulmonary exam normal breath sounds clear to auscultation       Cardiovascular Exercise Tolerance: Good hypertension, (-) angina(-) CAD, (-) Past MI, (-) Cardiac Stents and (-) CABG Normal cardiovascular exam(-) dysrhythmias (-) Valvular Problems/Murmurs Rhythm:regular Rate:Normal     Neuro/Psych  Headaches, neg Seizures negative psych ROS   GI/Hepatic negative GI ROS, Neg liver ROS,   Endo/Other  diabetes  Renal/GU negative Renal ROS  negative genitourinary   Musculoskeletal   Abdominal   Peds  Hematology negative hematology ROS (+)   Anesthesia Other Findings Past Medical History:   Diabetes mellitus without complication (H[facility])                 Migraines                                                    Hypertension                                                 Complication of anesthesia                                     Comment:migraine   Reproductive/Obstetrics negative OB ROS                             Anesthesia Physical Anesthesia Plan  ASA: II  Anesthesia Plan: General   Post-op Pain Management:    Induction:   Airway Management Planned:   Additional Equipment:   Intra-op Plan:   Post-operative Plan:   Informed Consent: I have reviewed the patients History and Physical, chart, labs and discussed the procedure including the risks, benefits and alternatives for the proposed anesthesia with the patient  or authorized representative who has indicated his/her understanding and acceptance.   Dental Advisory Given  Plan Discussed with: Anesthesiologist, CRNA and Surgeon  Anesthesia Plan Comments:         Anesthesia Quick Evaluation

## 2015-09-12 NOTE — Discharge Instructions (Signed)
May shower and pat wound dry. Skin glue will gradually begin to peel off after 2 weeks. Swelling may be expected for 3 days.  Use ice for 48 hours and sleep with head elevated for 3 days.  After 48 hours, may use heat for swelling.  No lifting over 5 pounds for 10 days.  Limit bending and straining.

## 2015-09-12 NOTE — Anesthesia Postprocedure Evaluation (Signed)
Anesthesia Post Note  Patient: Jonathan West  Procedure(s) Performed: Procedure(s) (LRB): EXCISION MASS NECK (Left)  Patient location during evaluation: PACU Anesthesia Type: General Level of consciousness: awake and alert Pain management: pain level controlled Vital Signs Assessment: post-procedure vital signs reviewed and stable Respiratory status: spontaneous breathing, nonlabored ventilation, respiratory function stable and patient connected to nasal cannula oxygen Cardiovascular status: blood pressure returned to baseline and stable Postop Assessment: no signs of nausea or vomiting Anesthetic complications: no    Last Vitals:  Filed Vitals:   09/12/15 1055 09/12/15 1152  BP: 134/95 136/86  Pulse: 69 69  Temp: 36.4 C 36.4 C  Resp: 14 16    Last Pain:  Filed Vitals:   09/12/15 1153  PainSc: 2                  Martha Clan

## 2015-09-12 NOTE — H&P (Signed)
..  History and Physical paper copy reviewed and updated date of procedure and will be scanned into system.  

## 2015-09-12 NOTE — Op Note (Signed)
..  09/12/2015  9:53 AM    Jonathan West  CV:[id number]   Pre-Op Dx: LEFT NECK MASS  Post-op Dx: SAME  Proc: Excision of left deep neck mass  Surg: Jonathan West  Anes: GOT  EBL: <10ccs  Comp: None  Indications: Enlarging left anterior level 3 mass, soft and mobile  Findings: Soft mobile mass, deep to platysma extending to surface of strap muscles and slightly across midline  Description of Procedure: After the patient was identified in hold and the history and physical and consent was reviewed and updated. The patient was marked in the normal fashion. The patient was next taken to the operating room and placed in a supine position. General endotracheal anesthesia was induced with LMA. The patient's left anterior neck was injected with 3cc's of 1% lidocaine with 1:100,000 Epinephrine. The patient was next prepped and draped in a sterile normal fashion.  At this time, a 15 blade scalpel was used to make a skin incision along a previously marked neck crease. Dissection was carefully performed through the subcutaneous tissues with combination of bipolar electrocautery and Bovie electrocautery and blunt dissection. A large mobile mass was encountered just deep to the platysma muscle and adherent to the platysma. Blunt dissection circumferentially was performed. When more posterior and inferior dissection was performed, a large vein invovling the mass was encountered and divided and tied with silk suture.  The mass was grasped with a Babcock and delivered from the wound.  The remaining deep attachments were removed with Bipolar and blunt dissection.  The mass was pass off the table for permanent pathological evaluation. The wound was copiously irrigated with sterile saline. Meticulous hemostasis with bipolar was obtained.  The wound was then closed in a multilayered fashion with vicryl for subcutaneous tissues and Dermabond type skin adhesive for the cutaneous skin and topped  with a steri-strip.  At this time the patient was extubated and taken to PACU in good condition.  Plan: Follow pathology. Follow up next week for post-operative evaluation.  Given deep nature, limit strenuous exercise for 10 days.  Jonathan West  09/12/2015 9:53 AM

## 2015-09-13 LAB — SURGICAL PATHOLOGY

## 2015-10-02 ENCOUNTER — Other Ambulatory Visit: Payer: Self-pay | Admitting: Nurse Practitioner

## 2015-10-02 ENCOUNTER — Encounter: Payer: Self-pay | Admitting: Nurse Practitioner

## 2015-10-03 ENCOUNTER — Other Ambulatory Visit: Payer: Self-pay

## 2015-10-03 MED ORDER — PROPRANOLOL HCL 40 MG PO TABS: 40.0000 mg | ORAL_TABLET | Freq: Two times a day (BID) | ORAL | Status: DC

## 2015-10-23 ENCOUNTER — Other Ambulatory Visit: Payer: Self-pay | Admitting: Nurse Practitioner

## 2015-10-28 ENCOUNTER — Telehealth: Payer: Self-pay | Admitting: Nurse Practitioner

## 2015-10-28 NOTE — Telephone Encounter (Signed)
Pt called to check the status. Pt has a number to get through quicker to [facility] [id number]. Thank you!

## 2015-10-28 NOTE — Telephone Encounter (Signed)
Pt called about his sitaGLIPtin (JANUVIA) 50 MG tablet about having it authorized through [facility]. Please call and speak to him about this. [phone].

## 2015-10-28 NOTE — Telephone Encounter (Signed)
Jonathan West have you started working on a PA on this pt. Please advise, thanks

## 2015-10-29 NOTE — Telephone Encounter (Signed)
Pt is calling to find out where he is in the process of getting a PA for his Januvia. Please advise, thanks

## 2015-10-30 NOTE — Telephone Encounter (Signed)
PA started today for Januvia.

## 2015-11-01 ENCOUNTER — Telehealth: Payer: Self-pay | Admitting: Nurse Practitioner

## 2015-11-01 NOTE — Telephone Encounter (Signed)
Pt called needing a PA for metformin from [employer].. Case [id number].Marland Kitchen Please advise pt at [phone]

## 2015-11-02 ENCOUNTER — Other Ambulatory Visit: Payer: Self-pay | Admitting: Nurse Practitioner

## 2015-11-04 ENCOUNTER — Telehealth: Payer: Self-pay

## 2015-11-04 MED ORDER — PROPRANOLOL HCL 40 MG PO TABS: 40.0000 mg | ORAL_TABLET | Freq: Two times a day (BID) | ORAL | Status: DC

## 2015-11-04 NOTE — Telephone Encounter (Signed)
PA approved for Januvia from 10/31/15-10/30/16.

## 2015-11-04 NOTE — Telephone Encounter (Signed)
Pt needed a refill on his Inderal, and pt wanted tp know what was going on with the PA for his metformin, pt was told it had been sent in this morning

## 2015-11-04 NOTE — Telephone Encounter (Signed)
PA paperwork faxed for Metformin.

## 2015-11-05 ENCOUNTER — Ambulatory Visit: Payer: BLUE CROSS/BLUE SHIELD | Admitting: Nurse Practitioner

## 2015-11-05 NOTE — Telephone Encounter (Signed)
PA approved for metformin from 11/04/2015 till 11/03/2016.

## 2015-11-06 ENCOUNTER — Ambulatory Visit: Payer: BLUE CROSS/BLUE SHIELD | Admitting: Nurse Practitioner

## 2015-11-08 ENCOUNTER — Other Ambulatory Visit: Payer: Self-pay | Admitting: Nurse Practitioner

## 2015-11-11 ENCOUNTER — Encounter: Payer: Self-pay | Admitting: Nurse Practitioner

## 2015-11-11 ENCOUNTER — Other Ambulatory Visit: Payer: Self-pay

## 2015-11-11 ENCOUNTER — Ambulatory Visit (INDEPENDENT_AMBULATORY_CARE_PROVIDER_SITE_OTHER): Payer: BLUE CROSS/BLUE SHIELD | Admitting: Nurse Practitioner

## 2015-11-11 ENCOUNTER — Other Ambulatory Visit: Payer: Self-pay | Admitting: Nurse Practitioner

## 2015-11-11 VITALS — BP 154/86 | HR 85 | Temp 97.9°F | Resp 14 | Ht 70.0 in | Wt 183.4 lb

## 2015-11-11 DIAGNOSIS — M79604 Pain in right leg: Secondary | ICD-10-CM

## 2015-11-11 DIAGNOSIS — N521 Erectile dysfunction due to diseases classified elsewhere: Secondary | ICD-10-CM

## 2015-11-11 DIAGNOSIS — M545 Low back pain, unspecified: Secondary | ICD-10-CM

## 2015-11-11 DIAGNOSIS — J069 Acute upper respiratory infection, unspecified: Secondary | ICD-10-CM

## 2015-11-11 DIAGNOSIS — E1165 Type 2 diabetes mellitus with hyperglycemia: Secondary | ICD-10-CM | POA: Diagnosis not present

## 2015-11-11 DIAGNOSIS — E1169 Type 2 diabetes mellitus with other specified complication: Secondary | ICD-10-CM

## 2015-11-11 DIAGNOSIS — D179 Benign lipomatous neoplasm, unspecified: Secondary | ICD-10-CM

## 2015-11-11 DIAGNOSIS — IMO0001 Reserved for inherently not codable concepts without codable children: Secondary | ICD-10-CM

## 2015-11-11 LAB — MICROALBUMIN / CREATININE URINE RATIO
Creatinine,U: 97.9 mg/dL
MICROALB UR: 5.3 mg/dL — AB (ref 0.0–1.9)
MICROALB/CREAT RATIO: 5.4 mg/g (ref 0.0–30.0)

## 2015-11-11 LAB — HEMOGLOBIN A1C: HEMOGLOBIN A1C: 7.6 % — AB (ref 4.6–6.5)

## 2015-11-11 MED ORDER — INSULIN ISOPHANE HUMAN 100 UNIT/ML KWIKPEN: 15.0000 [IU] | PEN_INJECTOR | Freq: Every day | SUBCUTANEOUS | Status: DC

## 2015-11-11 MED ORDER — GLIMEPIRIDE 4 MG PO TABS: ORAL_TABLET | ORAL | Status: DC

## 2015-11-11 MED ORDER — METFORMIN HCL 1000 MG PO TABS: 1000.0000 mg | ORAL_TABLET | Freq: Every day | ORAL | Status: DC

## 2015-11-11 MED ORDER — AMOXICILLIN-POT CLAVULANATE 875-125 MG PO TABS: 1.0000 | ORAL_TABLET | Freq: Two times a day (BID) | ORAL | Status: DC

## 2015-11-11 MED ORDER — METHOCARBAMOL 750 MG PO TABS: 750.0000 mg | ORAL_TABLET | Freq: Every day | ORAL | Status: DC

## 2015-11-11 MED ORDER — METFORMIN HCL ER (MOD) 1000 MG PO TB24: 1000.0000 mg | ORAL_TABLET | Freq: Every day | ORAL | Status: DC

## 2015-11-11 NOTE — Progress Notes (Signed)
Patient ID: Jonathan West, male    DOB: 10-25-1960  Age: 55 y.o. MRN: [id number]  CC: Follow-up and Diabetes   HPI Jonathan West presents for follow up of DM.   1) Janumet until Nov. Was covered, but was no longer after Jan. PA was denied and it was split into its separate components. Pt has had problems with only metformin, will see if he has problems with the separate components and place an appeal if so.  BS This week 200s-312, due to not having the meds   Been off for 3 weeks and they are being shipped  2) Neuropathy- Gabapentin helpful at 2 caps at night wants to try 3 caps   3) Will call in viagra when needed as well- pt still has some left  4) Sinus congestion and cough x 1.5 months. Gets better then it worsens, passed between wife and himself he reports OTC meds - cough drops   History Jonathan West has a past medical history of Diabetes mellitus without complication ([place]); Migraines; Hypertension; and Complication of anesthesia.   He has past surgical history that includes Hemorrhoid surgery (2005); Colonoscopy; and Excision mass neck (Left, 09/12/2015).   His family history includes Diabetes in his father; Heart disease in his father, maternal grandfather, and paternal grandfather.He reports that he has never smoked. He has never used smokeless tobacco. He reports that he does not drink alcohol or use illicit drugs.  Outpatient Prescriptions Prior to Visit  Medication Sig Dispense Refill  . butalbital-acetaminophen-caffeine (FIORICET, ESGIC) 50-325-40 MG tablet Take 1 tablet by mouth every 6 (six) hours as needed for headache. 90 tablet 1  . cholecalciferol (VITAMIN D) 1000 UNITS tablet Take 2,000 Units by mouth daily. Reported on 09/12/2015    . gabapentin (NEURONTIN) 300 MG capsule Take 2 capsules by mouth at bedtime 180 capsule 3  . glucose blood test strip One Touch Ultra strips to use at home to test blood sugars 2 times daily. 100 each 12  . Insulin Pen Needle 32G X 4 MM  MISC Use with insulin pen to inject into skin once a day.  PAtient needs small supply until mail order arrives thanks 10 each 0  . JANUVIA 50 MG tablet Take 1 tablet by mouth  daily 90 tablet 1  . propranolol (INDERAL) 40 MG tablet Take 1 tablet (40 mg total) by mouth 2 (two) times daily. 60 tablet 6  . sildenafil (VIAGRA) 50 MG tablet Take 1 tablet (50 mg total) by mouth daily as needed for erectile dysfunction. 10 tablet 0  . glimepiride (AMARYL) 4 MG tablet TAKE ONE TABLET BY MOUTH ONCE DAILY WITH BREAKFAST 30 tablet 3  . Insulin NPH, Human,, Isophane, (HUMULIN N KWIKPEN) 100 UNIT/ML Kiwkpen Inject 15 Units into the skin at bedtime. 15 mL 11  . methocarbamol (ROBAXIN) 750 MG tablet TAKE ONE TABLET BY MOUTH ONCE DAILY 30 tablet 0  . sitaGLIPtin-metformin (JANUMET) 50-1000 MG tablet Take 1 tablet by mouth 1 day or 1 dose.    Marland Kitchen HYDROcodone-acetaminophen (NORCO) 5-325 MG tablet Take 1 tablet by mouth every 4 (four) hours as needed for moderate pain. (Patient not taking: Reported on 11/11/2015) 30 tablet 0  . promethazine (PHENERGAN) 12.5 MG tablet Take 1 tablet (12.5 mg total) by mouth every 6 (six) hours as needed for nausea or vomiting. (Patient not taking: Reported on 11/11/2015) 30 tablet 0   No facility-administered medications prior to visit.    ROS Review of Systems  Constitutional: Negative for fever,  chills, diaphoresis and fatigue.  HENT: Positive for congestion, postnasal drip, rhinorrhea, sinus pressure and sore throat. Negative for sneezing, trouble swallowing and voice change.   Eyes: Negative for visual disturbance.  Respiratory: Positive for cough. Negative for chest tightness, shortness of breath and wheezing.   Cardiovascular: Negative for chest pain, palpitations and leg swelling.  Gastrointestinal: Negative for nausea, vomiting and diarrhea.  Endocrine: Negative for polydipsia, polyphagia and polyuria.  Skin: Negative for rash.  Neurological: Negative for dizziness, weakness  and numbness.  Psychiatric/Behavioral: The patient is not nervous/anxious.     Objective:  BP 154/86 mmHg  Pulse 85  Temp(Src) 97.9 F (36.6 C) (Oral)  Resp 14  Ht 5\' 10"  (1.778 m)  Wt 183 lb 6.4 oz (83.19 kg)  BMI 26.32 kg/m2  SpO2 96%  Physical Exam  Constitutional: He is oriented to person, place, and time. He appears well-developed and well-nourished. No distress.  HENT:  Head: Normocephalic and atraumatic.  Right Ear: External ear normal.  Left Ear: External ear normal.  Mouth/Throat: No oropharyngeal exudate.  Tms clear  Eyes: EOM are normal. Pupils are equal, round, and reactive to light. Right eye exhibits no discharge. Left eye exhibits no discharge. No scleral icterus.  Neck: Normal range of motion. Neck supple. No thyromegaly present.  Lipoma resolved, well healing scar  Cardiovascular: Normal rate, regular rhythm and normal heart sounds.  Exam reveals no gallop and no friction rub.   No murmur heard. Pulmonary/Chest: Effort normal and breath sounds normal. No respiratory distress. He has no wheezes. He has no rales. He exhibits no tenderness.  Lymphadenopathy:    He has no cervical adenopathy.  Neurological: He is alert and oriented to person, place, and time.  Skin: Skin is warm and dry. No rash noted. He is not diaphoretic.  Psychiatric: He has a normal mood and affect. His behavior is normal. Judgment and thought content normal.   Assessment & Plan:   Jonathan was seen today for follow-up and diabetes.  Diagnoses and all orders for this visit:  Uncontrolled type 2 diabetes mellitus without complication, without long-term current use of insulin (HCC) -     Hemoglobin A1c -     Urine Microalbumin w/creat. ratio  Lipoma  Low back pain radiating to right leg  Erectile dysfunction associated with type 2 diabetes mellitus (HCC)  Acute URI  Other orders -     methocarbamol (ROBAXIN) 750 MG tablet; Take 1 tablet (750 mg total) by mouth daily. -      glimepiride (AMARYL) 4 MG tablet; TAKE ONE TABLET BY MOUTH ONCE DAILY WITH BREAKFAST -     Insulin NPH, Human,, Isophane, (HUMULIN N KWIKPEN) 100 UNIT/ML Kiwkpen; Inject 15 Units into the skin at bedtime. -     amoxicillin-clavulanate (AUGMENTIN) 875-125 MG tablet; Take 1 tablet by mouth 2 (two) times daily.   I have discontinued Mr. West sitaGLIPtin-metformin, HYDROcodone-acetaminophen, and promethazine. I have also changed his methocarbamol. Additionally, I am having him start on amoxicillin-clavulanate. Lastly, I am having him maintain his glucose blood, cholecalciferol, sildenafil, butalbital-acetaminophen-caffeine, Insulin Pen Needle, gabapentin, propranolol, JANUVIA, glimepiride, and Insulin NPH (Human) (Isophane).  Meds ordered this encounter  Medications  . methocarbamol (ROBAXIN) 750 MG tablet    Sig: Take 1 tablet (750 mg total) by mouth daily.    Dispense:  90 tablet    Refill:  1    Order Specific Question:  Supervising Provider    Answer:  Deborra Medina L [[id number]]  . glimepiride (AMARYL) 4  MG tablet    Sig: TAKE ONE TABLET BY MOUTH ONCE DAILY WITH BREAKFAST    Dispense:  90 tablet    Refill:  1    Order Specific Question:  Supervising Provider    Answer:  Deborra Medina L [[id number]]  . Insulin NPH, Human,, Isophane, (HUMULIN N KWIKPEN) 100 UNIT/ML Kiwkpen    Sig: Inject 15 Units into the skin at bedtime.    Dispense:  15 mL    Refill:  11    Order Specific Question:  Supervising Provider    Answer:  Deborra Medina L [[id number]]  . amoxicillin-clavulanate (AUGMENTIN) 875-125 MG tablet    Sig: Take 1 tablet by mouth 2 (two) times daily.    Dispense:  14 tablet    Refill:  0    Order Specific Question:  Supervising Provider    Answer:  Crecencio Mc [[id number]]     Follow-up: Return in about 3 months (around 02/11/2016) for Follow up.

## 2015-11-11 NOTE — Patient Instructions (Addendum)
See if you like the 3 capsules of Gabapentin better.   Please visit the lab today.   Augmentin sent to your pharmacy

## 2015-11-12 ENCOUNTER — Telehealth: Payer: Self-pay

## 2015-11-12 NOTE — Telephone Encounter (Signed)
[facility] needs clarity on which Metformin he is suppose to have, the Glucophage 1000mg  (immediate release tablet ) daily or the glumetza 1000mg  (extended release tablet) daily.  Please advise and I will call them tomorrow.  thanks

## 2015-11-13 NOTE — Telephone Encounter (Signed)
Thanks I resubmitted to them.  Have a great day!

## 2015-11-13 NOTE — Telephone Encounter (Signed)
Gluzmeta extended release version. Thanks!

## 2015-11-19 DIAGNOSIS — J069 Acute upper respiratory infection, unspecified: Secondary | ICD-10-CM | POA: Insufficient documentation

## 2015-11-19 NOTE — Assessment & Plan Note (Signed)
Resolved by ENT after surgery Pt is doing great!

## 2015-11-19 NOTE — Assessment & Plan Note (Signed)
Does not need refill of viagra at this time.

## 2015-11-19 NOTE — Assessment & Plan Note (Signed)
Getting meds situated through [facility]  Pt is still checking sugars Will obtain A1c and urine microalbumin if able

## 2015-11-19 NOTE — Assessment & Plan Note (Signed)
Augmentin sent to pharmacy New problem Encouraged probiotics Cough syrups- sugar free OTC  FU prn worsening/failure to improve.

## 2015-11-19 NOTE — Assessment & Plan Note (Signed)
Trying gabapentin 3 caps at night- when he needs a refill he will let us know if needs to be 3 or 2 caps.  Will follow

## 2015-12-06 ENCOUNTER — Other Ambulatory Visit: Payer: Self-pay | Admitting: Nurse Practitioner

## 2015-12-06 ENCOUNTER — Encounter: Payer: Self-pay | Admitting: Nurse Practitioner

## 2015-12-06 NOTE — Telephone Encounter (Signed)
Patient stated that you both talked about changing the gabapentin prescription from B.I.D to T.I.D. Have changed to T.I.D please make sure this is appropriate.  Please advise the change?

## 2015-12-09 MED ORDER — GABAPENTIN 300 MG PO CAPS: 300.0000 mg | ORAL_CAPSULE | Freq: Three times a day (TID) | ORAL | Status: DC

## 2016-02-10 ENCOUNTER — Ambulatory Visit: Payer: BLUE CROSS/BLUE SHIELD | Admitting: Nurse Practitioner

## 2016-02-18 ENCOUNTER — Encounter: Payer: Self-pay | Admitting: Family Medicine

## 2016-02-18 ENCOUNTER — Other Ambulatory Visit: Payer: Self-pay | Admitting: Nurse Practitioner

## 2016-02-18 ENCOUNTER — Ambulatory Visit (INDEPENDENT_AMBULATORY_CARE_PROVIDER_SITE_OTHER): Payer: BLUE CROSS/BLUE SHIELD | Admitting: Family Medicine

## 2016-02-18 ENCOUNTER — Other Ambulatory Visit: Payer: Self-pay | Admitting: Internal Medicine

## 2016-02-18 VITALS — BP 136/88 | HR 85 | Temp 98.2°F | Ht 70.0 in | Wt 188.4 lb

## 2016-02-18 DIAGNOSIS — E1165 Type 2 diabetes mellitus with hyperglycemia: Secondary | ICD-10-CM | POA: Diagnosis not present

## 2016-02-18 DIAGNOSIS — Z1159 Encounter for screening for other viral diseases: Secondary | ICD-10-CM

## 2016-02-18 DIAGNOSIS — Z794 Long term (current) use of insulin: Secondary | ICD-10-CM | POA: Diagnosis not present

## 2016-02-18 DIAGNOSIS — E119 Type 2 diabetes mellitus without complications: Secondary | ICD-10-CM | POA: Diagnosis not present

## 2016-02-18 DIAGNOSIS — I10 Essential (primary) hypertension: Secondary | ICD-10-CM | POA: Diagnosis not present

## 2016-02-18 DIAGNOSIS — M545 Low back pain: Secondary | ICD-10-CM

## 2016-02-18 DIAGNOSIS — IMO0001 Reserved for inherently not codable concepts without codable children: Secondary | ICD-10-CM

## 2016-02-18 DIAGNOSIS — M79604 Pain in right leg: Secondary | ICD-10-CM

## 2016-02-18 LAB — HEMOGLOBIN A1C: HEMOGLOBIN A1C: 8.4 % — AB (ref 4.6–6.5)

## 2016-02-18 NOTE — Assessment & Plan Note (Signed)
Much improved. Continue gabapentin. He'll return precautions.

## 2016-02-18 NOTE — Assessment & Plan Note (Signed)
Well controlled. Continue current medications  

## 2016-02-18 NOTE — Assessment & Plan Note (Signed)
Blood sugars appear to be not well controlled currently. We'll check an A1c today. Foot exam completed. Has seen ophthalmology in the last year.

## 2016-02-18 NOTE — Progress Notes (Signed)
Patient ID: [name], male   DOB: Apr 16, 1961, 55 y.o.   MRN: [id number]  [name], MD Phone: [phone]  CHRISTOPHERMICH SENDEJO is a 55 y.o. male who presents today for follow-up.  HYPERTENSION Disease Monitoring: Blood pressure range-not checking Chest pain- no      Dyspnea- no Medications: Compliance- taking propranolol   Edema- no  DIABETES Disease Monitoring: Blood Sugar ranges-typically less than 150, though reports he did go on vacation recently and has been in the upper 100s to low 200s. Was 171 this morning. Polyuria/phagia/dipsia- no      Visual problems- no Medications: Compliance- taking NPH 15 units at bedtime. Also taking Januvia, metformin, and Amaryl Hypoglycemic symptoms- no  Patient also reports taking gabapentin as mentioned nerve. No pain or symptoms at this time. Had had shooting discomfort down the lateral aspect of his left leg. Minimal low back discomfort with this in the past. No numbness, weakness, saddle anesthesia, loss of bowel or bladder function, fevers, or history of cancer.  PMH: nonsmoker.   ROS see history of present illness  Objective  Physical Exam Filed Vitals:   02/18/16 0837  BP: 136/88  Pulse: 85  Temp: 98.2 F (36.8 C)    BP Readings from Last 3 Encounters:  02/18/16 136/88  11/11/15 154/86  09/12/15 136/86   Wt Readings from Last 3 Encounters:  02/18/16 188 lb 6.4 oz (85.458 kg)  11/11/15 183 lb 6.4 oz (83.19 kg)  09/12/15 182 lb (82.555 kg)    Physical Exam  Constitutional: He is well-developed, well-nourished, and in no distress.  HENT:  Head: Normocephalic and atraumatic.  Right Ear: External ear normal.  Left Ear: External ear normal.  Mouth/Throat: Oropharynx is clear and moist. No oropharyngeal exudate.  Eyes: Conjunctivae are normal. Pupils are equal, round, and reactive to light.  Cardiovascular: Normal rate, regular rhythm and normal heart sounds.   Pulmonary/Chest: Effort normal and breath sounds  normal.  Musculoskeletal: He exhibits no edema.  No midline spine tenderness, no midline spine step-off, no muscular back tenderness  Neurological: He is alert. Gait normal.  5 out of 5 strength bilateral quads, he mentions, plantar flexion, and dorsiflexion, sensation light touch intact bilateral lower extremities  Skin: Skin is warm and dry. He is not diaphoretic.  Bilateral feet with no skin changes, 2+ DP and PT pulses     Assessment/Plan: Please see individual problem list.  DM (diabetes mellitus), type 2, uncontrolled Blood sugars appear to be not well controlled currently. We'll check an A1c today. Foot exam completed. Has seen ophthalmology in the last year.  Essential hypertension Well-controlled. Continue current medications.  Low back pain radiating to left leg Much improved. Continue gabapentin. He'll return precautions.    Orders Placed This Encounter  Procedures  . HgB A1c  . Hepatitis C Antibody    [name], MD [place]

## 2016-02-18 NOTE — Progress Notes (Signed)
Pre visit review using our clinic review tool, if applicable. No additional management support is needed unless otherwise documented below in the visit note. 

## 2016-02-18 NOTE — Telephone Encounter (Signed)
Refilled by Lorane Gell on 08/06/15. He saw you today. Please advise?

## 2016-02-18 NOTE — Patient Instructions (Signed)
Nice to meet you. Return to check her A1c today and we'll call you with the results. Please continue to monitor your blood sugars and if they continue to remain above 150 please let us know.

## 2016-02-19 LAB — HEPATITIS C ANTIBODY: HCV AB: NEGATIVE

## 2016-02-21 NOTE — Telephone Encounter (Signed)
Faxed

## 2016-02-21 NOTE — Telephone Encounter (Signed)
Refill can be given. We will need to discuss headaches at his next visit.

## 2016-03-29 ENCOUNTER — Encounter: Payer: Self-pay | Admitting: Family Medicine

## 2016-03-31 ENCOUNTER — Ambulatory Visit: Payer: BLUE CROSS/BLUE SHIELD | Admitting: Family Medicine

## 2016-04-20 ENCOUNTER — Encounter: Payer: Self-pay | Admitting: Family Medicine

## 2016-04-20 MED ORDER — BLOOD GLUCOSE METER KIT: PACK | 0 refills | Status: DC

## 2016-04-27 ENCOUNTER — Other Ambulatory Visit: Payer: Self-pay | Admitting: Nurse Practitioner

## 2016-04-29 ENCOUNTER — Telehealth: Payer: Self-pay | Admitting: *Deleted

## 2016-04-29 ENCOUNTER — Other Ambulatory Visit: Payer: Self-pay | Admitting: Surgical

## 2016-04-29 MED ORDER — METFORMIN HCL ER (MOD) 1000 MG PO TB24: 1000.0000 mg | ORAL_TABLET | Freq: Every day | ORAL | 1 refills | Status: DC

## 2016-04-29 NOTE — Telephone Encounter (Signed)
Phone line was not working at this time.

## 2016-04-29 NOTE — Telephone Encounter (Signed)
[facility] has requested to have clarity on pt's rx sent over for test strips. [phone] Ref # [id number]

## 2016-04-30 NOTE — Telephone Encounter (Signed)
[facility] has been informed of medication.

## 2016-05-28 ENCOUNTER — Ambulatory Visit: Payer: BLUE CROSS/BLUE SHIELD | Admitting: Family Medicine

## 2016-06-20 ENCOUNTER — Other Ambulatory Visit: Payer: Self-pay | Admitting: Family Medicine

## 2016-06-20 DIAGNOSIS — E119 Type 2 diabetes mellitus without complications: Secondary | ICD-10-CM

## 2016-06-20 NOTE — Telephone Encounter (Signed)
My Chart message sent

## 2016-06-20 NOTE — Telephone Encounter (Signed)
Refill given. Patient needs to schedule a follow-up. Thanks.

## 2016-06-20 NOTE — Telephone Encounter (Signed)
Medications refilled 02/2016. Pt last seen 02/18/16. Please advise?

## 2016-06-22 ENCOUNTER — Ambulatory Visit (INDEPENDENT_AMBULATORY_CARE_PROVIDER_SITE_OTHER): Payer: BLUE CROSS/BLUE SHIELD | Admitting: Family Medicine

## 2016-06-22 ENCOUNTER — Encounter: Payer: Self-pay | Admitting: Family Medicine

## 2016-06-22 VITALS — BP 150/96 | HR 73 | Temp 98.4°F | Wt 188.2 lb

## 2016-06-22 DIAGNOSIS — S3992XA Unspecified injury of lower back, initial encounter: Secondary | ICD-10-CM | POA: Insufficient documentation

## 2016-06-22 DIAGNOSIS — E119 Type 2 diabetes mellitus without complications: Secondary | ICD-10-CM | POA: Diagnosis not present

## 2016-06-22 DIAGNOSIS — Z794 Long term (current) use of insulin: Secondary | ICD-10-CM | POA: Diagnosis not present

## 2016-06-22 DIAGNOSIS — E1165 Type 2 diabetes mellitus with hyperglycemia: Secondary | ICD-10-CM | POA: Diagnosis not present

## 2016-06-22 DIAGNOSIS — M7542 Impingement syndrome of left shoulder: Secondary | ICD-10-CM | POA: Diagnosis not present

## 2016-06-22 DIAGNOSIS — I10 Essential (primary) hypertension: Secondary | ICD-10-CM | POA: Diagnosis not present

## 2016-06-22 DIAGNOSIS — IMO0001 Reserved for inherently not codable concepts without codable children: Secondary | ICD-10-CM

## 2016-06-22 DIAGNOSIS — M722 Plantar fascial fibromatosis: Secondary | ICD-10-CM | POA: Insufficient documentation

## 2016-06-22 LAB — COMPREHENSIVE METABOLIC PANEL
ALBUMIN: 4.5 g/dL (ref 3.5–5.2)
ALK PHOS: 86 U/L (ref 39–117)
ALT: 16 U/L (ref 0–53)
AST: 14 U/L (ref 0–37)
BILIRUBIN TOTAL: 0.5 mg/dL (ref 0.2–1.2)
BUN: 19 mg/dL (ref 6–23)
CALCIUM: 9.8 mg/dL (ref 8.4–10.5)
CO2: 28 mEq/L (ref 19–32)
Chloride: 104 mEq/L (ref 96–112)
Creatinine, Ser: 1.19 mg/dL (ref 0.40–1.50)
GFR: 66.88 mL/min (ref >60.00)
GLUCOSE: 124 mg/dL — AB (ref 70–99)
POTASSIUM: 4.2 meq/L (ref 3.5–5.1)
Sodium: 141 mEq/L (ref 135–145)
TOTAL PROTEIN: 7.2 g/dL (ref 6.0–8.3)

## 2016-06-22 LAB — HEMOGLOBIN A1C: HEMOGLOBIN A1C: 8.3 % — AB (ref 4.6–6.5)

## 2016-06-22 MED ORDER — LISINOPRIL 10 MG PO TABS: 10.0000 mg | ORAL_TABLET | Freq: Every day | ORAL | 0 refills | Status: DC

## 2016-06-22 MED ORDER — LISINOPRIL 10 MG PO TABS: 10.0000 mg | ORAL_TABLET | Freq: Every day | ORAL | 3 refills | Status: DC

## 2016-06-22 NOTE — Assessment & Plan Note (Signed)
Not a goal today. We will start on lisinopril given that he has diabetes. We'll check a CMP today. He'll return in 1 week for a blood pressure check and repeat lab work.

## 2016-06-22 NOTE — Progress Notes (Signed)
Jonathan Rumps, MD Phone: [phone]  Jonathan West is a 55 y.o. male who presents today for f/u.  HYPERTENSION  Disease Monitoring  Home BP Monitoring not checking Chest pain- no    Dyspnea- no Medications  Compliance-  taking propranolol.  Edema- no  DIABETES Disease Monitoring: Blood Sugar ranges-119-280 Polyuria/phagia/dipsia- no      ophthalmology- due in November Medications: Compliance- taking metformin, Januvia, Amaryl, nph Hypoglycemic symptoms- no  Left shoulder soreness: Patient notes he has had this for some time. Present since at least prior to our last practitioner leaving. Was put on gabapentin and this helped some. Notes he has discomfort on abduction and external rotation. No specific injury.  Does note he hurt his tailbone last week when he slid down a slide with a grandchild and hit the mat after coming off the slide. Notes it only hurts when he sits on it. No bruising. No numbness or weakness. No radiation of pain.  Patient also notes pain in his left heel with the first step in the morning and after sitting for a long period of time. No pain at other times. He has a history of plantar fasciitis. Not doing anything for this currently.  PMH: nonsmoker.   ROS see history of present illness  Objective  Physical Exam Vitals:   06/22/16 1335  BP: (!) 150/96  Pulse: 73  Temp: 98.4 F (36.9 C)    BP Readings from Last 3 Encounters:  06/22/16 (!) 150/96  02/18/16 136/88  11/11/15 (!) 154/86   Wt Readings from Last 3 Encounters:  06/22/16 188 lb 3.2 oz (85.4 kg)  02/18/16 188 lb 6.4 oz (85.5 kg)  11/11/15 183 lb 6.4 oz (83.2 kg)    Physical Exam  Constitutional: He is well-developed, well-nourished, and in no distress.  Cardiovascular: Normal rate, regular rhythm and normal heart sounds.   Pulmonary/Chest: Effort normal and breath sounds normal.  Musculoskeletal: He exhibits no edema.  No shoulder tenderness, he has full range of motion  bilateral shoulders, pain with passive and active abduction and external rotation on the left, no discomfort on the right, negative drop arm test, positive empty can on the left, No tenderness to palpation of his left foot on the plantar surface No bruising or tenderness over his coccyx or sacrum  Neurological: He is alert. Gait normal.  Skin: Skin is warm and dry.     Assessment/Plan: Please see individual problem list.  DM (diabetes mellitus), type 2, uncontrolled Not at goal. He'll continue his current medications we'll check an A1c today prior to making any changes.  Essential hypertension Not a goal today. We will start on lisinopril given that he has diabetes. We'll check a CMP today. He'll return in 1 week for a blood pressure check and repeat lab work.  Rotator cuff impingement syndrome, left Symptoms and exam most consistent rotator cuff impingement. Given exercises. If no improvement with this would consider physical therapy or sports medicine referral.  Tailbone injury No obvious abnormalities today. Discussed getting a doughnut pillow to see if this is beneficial for pain relief. If not improving he'll let us know.  Plantar fasciitis of left foot Symptoms likely related to plantar fasciitis. Discussed icing the bottom of his foot with a water bottle. Also stretches discussed.   Orders Placed This Encounter  Procedures  . Comp Met (CMET)  . HgB A1c    Meds ordered this encounter  Medications  . DISCONTD: lisinopril (PRINIVIL,ZESTRIL) 10 MG tablet    Sig:  Take 1 tablet (10 mg total) by mouth daily.    Dispense:  90 tablet    Refill:  3  . lisinopril (PRINIVIL,ZESTRIL) 10 MG tablet    Sig: Take 1 tablet (10 mg total) by mouth daily.    Dispense:  30 tablet    Refill:  0    Jonathan Rumps, MD [place]

## 2016-06-22 NOTE — Assessment & Plan Note (Signed)
No obvious abnormalities today. Discussed getting a doughnut pillow to see if this is beneficial for pain relief. If not improving he'll let us know.

## 2016-06-22 NOTE — Progress Notes (Signed)
Pre visit review using our clinic review tool, if applicable. No additional management support is needed unless otherwise documented below in the visit note. 

## 2016-06-22 NOTE — Assessment & Plan Note (Signed)
Symptoms likely related to plantar fasciitis. Discussed icing the bottom of his foot with a water bottle. Also stretches discussed.

## 2016-06-22 NOTE — Patient Instructions (Signed)
Nice to see you. We'll check lab work today and call you with the results. Given your blood pressure is elevated we are going to add a blood pressure medicine that will also help protect you kidneys given that you have diabetes. Please do the exercises for your shoulder. You can get a inflatable doughnut for your tailbone.   Impingement Syndrome, Rotator Cuff, Bursitis With Rehab Impingement syndrome is a condition that involves inflammation of the tendons of the rotator cuff and the subacromial bursa, that causes pain in the shoulder. The rotator cuff consists of four tendons and muscles that control much of the shoulder and upper arm function. The subacromial bursa is a fluid filled sac that helps reduce friction between the rotator cuff and one of the bones of the shoulder (acromion). Impingement syndrome is usually an overuse injury that causes swelling of the bursa (bursitis), swelling of the tendon (tendonitis), and/or a tear of the tendon (strain). Strains are classified into three categories. Grade 1 strains cause pain, but the tendon is not lengthened. Grade 2 strains include a lengthened ligament, due to the ligament being stretched or partially ruptured. With grade 2 strains there is still function, although the function may be decreased. Grade 3 strains include a complete tear of the tendon or muscle, and function is usually impaired. SYMPTOMS   Pain around the shoulder, often at the outer portion of the upper arm.  Pain that gets worse with shoulder function, especially when reaching overhead or lifting.  Sometimes, aching when not using the arm.  Pain that wakes you up at night.  Sometimes, tenderness, swelling, warmth, or redness over the affected area.  Loss of strength.  Limited motion of the shoulder, especially reaching behind the back (to the back pocket or to unhook bra) or across your body.  Crackling sound (crepitation) when moving the arm.  Biceps tendon pain and  inflammation (in the front of the shoulder). Worse when bending the elbow or lifting. CAUSES  Impingement syndrome is often an overuse injury, in which chronic (repetitive) motions cause the tendons or bursa to become inflamed. A strain occurs when a force is paced on the tendon or muscle that is greater than it can withstand. Common mechanisms of injury include: Stress from sudden increase in duration, frequency, or intensity of training.  Direct hit (trauma) to the shoulder.  Aging, erosion of the tendon with normal use.  Bony bump on shoulder (acromial spur). RISK INCREASES WITH:  Contact sports (football, wrestling, boxing).  Throwing sports (baseball, tennis, volleyball).  Weightlifting and bodybuilding.  Heavy labor.  Previous injury to the rotator cuff, including impingement.  Poor shoulder strength and flexibility.  Failure to warm up properly before activity.  Inadequate protective equipment.  Old age.  Bony bump on shoulder (acromial spur). PREVENTION   Warm up and stretch properly before activity.  Allow for adequate recovery between workouts.  Maintain physical fitness:  Strength, flexibility, and endurance.  Cardiovascular fitness.  Learn and use proper exercise technique. PROGNOSIS  If treated properly, impingement syndrome usually goes away within 6 weeks. Sometimes surgery is required.  RELATED COMPLICATIONS   Longer healing time if not properly treated, or if not given enough time to heal.  Recurring symptoms, that result in a chronic condition.  Shoulder stiffness, frozen shoulder, or loss of motion.  Rotator cuff tendon tear.  Recurring symptoms, especially if activity is resumed too soon, with overuse, with a direct blow, or when using poor technique. TREATMENT  Treatment first involves  the use of ice and medicine, to reduce pain and inflammation. The use of strengthening and stretching exercises may help reduce pain with activity. These  exercises may be performed at home or with a therapist. If non-surgical treatment is unsuccessful after more than 6 months, surgery may be advised. After surgery and rehabilitation, activity is usually possible in 3 months.  MEDICATION  If pain medicine is needed, nonsteroidal anti-inflammatory medicines (aspirin and ibuprofen), or other minor pain relievers (acetaminophen), are often advised.  Do not take pain medicine for 7 days before surgery.  Prescription pain relievers may be given, if your caregiver thinks they are needed. Use only as directed and only as much as you need.  Corticosteroid injections may be given by your caregiver. These injections should be reserved for the most serious cases, because they may only be given a certain number of times. HEAT AND COLD  Cold treatment (icing) should be applied for 10 to 15 minutes every 2 to 3 hours for inflammation and pain, and immediately after activity that aggravates your symptoms. Use ice packs or an ice massage.  Heat treatment may be used before performing stretching and strengthening activities prescribed by your caregiver, physical therapist, or athletic trainer. Use a heat pack or a warm water soak. SEEK MEDICAL CARE IF:   Symptoms get worse or do not improve in 4 to 6 weeks, despite treatment.  New, unexplained symptoms develop. (Drugs used in treatment may produce side effects.) EXERCISES  RANGE OF MOTION (ROM) AND STRETCHING EXERCISES - Impingement Syndrome (Rotator Cuff  Tendinitis, Bursitis) These exercises may help you when beginning to rehabilitate your injury. Your symptoms may go away with or without further involvement from your physician, physical therapist or athletic trainer. While completing these exercises, remember:   Restoring tissue flexibility helps normal motion to return to the joints. This allows healthier, less painful movement and activity.  An effective stretch should be held for at least 30  seconds.  A stretch should never be painful. You should only feel a gentle lengthening or release in the stretched tissue. STRETCH - Flexion, Standing  Stand with good posture. With an underhand grip on your right / left hand, and an overhand grip on the opposite hand, grasp a broomstick or cane so that your hands are a little more than shoulder width apart.  Keeping your right / left elbow straight and shoulder muscles relaxed, push the stick with your opposite hand, to raise your right / left arm in front of your body and then overhead. Raise your arm until you feel a stretch in your right / left shoulder, but before you have increased shoulder pain.  Try to avoid shrugging your right / left shoulder as your arm rises, by keeping your shoulder blade tucked down and toward your mid-back spine. Hold for __________ seconds.  Slowly return to the starting position. Repeat __________ times. Complete this exercise __________ times per day. STRETCH - Abduction, Supine  Lie on your back. With an underhand grip on your right / left hand and an overhand grip on the opposite hand, grasp a broomstick or cane so that your hands are a little more than shoulder width apart.  Keeping your right / left elbow straight and your shoulder muscles relaxed, push the stick with your opposite hand, to raise your right / left arm out to the side of your body and then overhead. Raise your arm until you feel a stretch in your right / left shoulder, but before  you have increased shoulder pain.  Try to avoid shrugging your right / left shoulder as your arm rises, by keeping your shoulder blade tucked down and toward your mid-back spine. Hold for __________ seconds.  Slowly return to the starting position. Repeat __________ times. Complete this exercise __________ times per day. ROM - Flexion, Active-Assisted  Lie on your back. You may bend your knees for comfort.  Grasp a broomstick or cane so your hands are about  shoulder width apart. Your right / left hand should grip the end of the stick, so that your hand is positioned "thumbs-up," as if you were about to shake hands.  Using your healthy arm to lead, raise your right / left arm overhead, until you feel a gentle stretch in your shoulder. Hold for __________ seconds.  Use the stick to assist in returning your right / left arm to its starting position. Repeat __________ times. Complete this exercise __________ times per day.  ROM - Internal Rotation, Supine   Lie on your back on a firm surface. Place your right / left elbow about 60 degrees away from your side. Elevate your elbow with a folded towel, so that the elbow and shoulder are the same height.  Using a broomstick or cane and your strong arm, pull your right / left hand toward your body until you feel a gentle stretch, but no increase in your shoulder pain. Keep your shoulder and elbow in place throughout the exercise.  Hold for __________ seconds. Slowly return to the starting position. Repeat __________ times. Complete this exercise __________ times per day. STRETCH - Internal Rotation  Place your right / left hand behind your back, palm up.  Throw a towel or belt over your opposite shoulder. Grasp the towel with your right / left hand.  While keeping an upright posture, gently pull up on the towel, until you feel a stretch in the front of your right / left shoulder.  Avoid shrugging your right / left shoulder as your arm rises, by keeping your shoulder blade tucked down and toward your mid-back spine.  Hold for __________ seconds. Release the stretch, by lowering your healthy hand. Repeat __________ times. Complete this exercise __________ times per day. ROM - Internal Rotation   Using an underhand grip, grasp a stick behind your back with both hands.  While standing upright with good posture, slide the stick up your back until you feel a mild stretch in the front of your  shoulder.  Hold for __________ seconds. Slowly return to your starting position. Repeat __________ times. Complete this exercise __________ times per day.  STRETCH - Posterior Shoulder Capsule   Stand or sit with good posture. Grasp your right / left elbow and draw it across your chest, keeping it at the same height as your shoulder.  Pull your elbow, so your upper arm comes in closer to your chest. Pull until you feel a gentle stretch in the back of your shoulder.  Hold for __________ seconds. Repeat __________ times. Complete this exercise __________ times per day. STRENGTHENING EXERCISES - Impingement Syndrome (Rotator Cuff Tendinitis, Bursitis) These exercises may help you when beginning to rehabilitate your injury. They may resolve your symptoms with or without further involvement from your physician, physical therapist or athletic trainer. While completing these exercises, remember:  Muscles can gain both the endurance and the strength needed for everyday activities through controlled exercises.  Complete these exercises as instructed by your physician, physical therapist or athletic trainer. Increase the  resistance and repetitions only as guided.  You may experience muscle soreness or fatigue, but the pain or discomfort you are trying to eliminate should never worsen during these exercises. If this pain does get worse, stop and make sure you are following the directions exactly. If the pain is still present after adjustments, discontinue the exercise until you can discuss the trouble with your clinician.  During your recovery, avoid activity or exercises which involve actions that place your injured hand or elbow above your head or behind your back or head. These positions stress the tissues which you are trying to heal. STRENGTH - Scapular Depression and Adduction   With good posture, sit on a firm chair. Support your arms in front of you, with pillows, arm rests, or on a table top.  Have your elbows in line with the sides of your body.  Gently draw your shoulder blades down and toward your mid-back spine. Gradually increase the tension, without tensing the muscles along the top of your shoulders and the back of your neck.  Hold for __________ seconds. Slowly release the tension and relax your muscles completely before starting the next repetition.  After you have practiced this exercise, remove the arm support and complete the exercise in standing as well as sitting position. Repeat __________ times. Complete this exercise __________ times per day.  STRENGTH - Shoulder Abductors, Isometric  With good posture, stand or sit about 4-6 inches from a wall, with your right / left side facing the wall.  Bend your right / left elbow. Gently press your right / left elbow into the wall. Increase the pressure gradually, until you are pressing as hard as you can, without shrugging your shoulder or increasing any shoulder discomfort.  Hold for __________ seconds.  Release the tension slowly. Relax your shoulder muscles completely before you begin the next repetition. Repeat __________ times. Complete this exercise __________ times per day.  STRENGTH - External Rotators, Isometric  Keep your right / left elbow at your side and bend it 90 degrees.  Step into a door frame so that the outside of your right / left wrist can press against the door frame without your upper arm leaving your side.  Gently press your right / left wrist into the door frame, as if you were trying to swing the back of your hand away from your stomach. Gradually increase the tension, until you are pressing as hard as you can, without shrugging your shoulder or increasing any shoulder discomfort.  Hold for __________ seconds.  Release the tension slowly. Relax your shoulder muscles completely before you begin the next repetition. Repeat __________ times. Complete this exercise __________ times per day.   STRENGTH - Supraspinatus   Stand or sit with good posture. Grasp a __________ weight, or an exercise band or tubing, so that your hand is "thumbs-up," like you are shaking hands.  Slowly lift your right / left arm in a "V" away from your thigh, diagonally into the space between your side and straight ahead. Lift your hand to shoulder height or as far as you can, without increasing any shoulder pain. At first, many people do not lift their hands above shoulder height.  Avoid shrugging your right / left shoulder as your arm rises, by keeping your shoulder blade tucked down and toward your mid-back spine.  Hold for __________ seconds. Control the descent of your hand, as you slowly return to your starting position. Repeat __________ times. Complete this exercise __________ times per  day.  STRENGTH - External Rotators  Secure a rubber exercise band or tubing to a fixed object (table, pole) so that it is at the same height as your right / left elbow when you are standing or sitting on a firm surface.  Stand or sit so that the secured exercise band is at your uninjured side.  Bend your right / left elbow 90 degrees. Place a folded towel or small pillow under your right / left arm, so that your elbow is a few inches away from your side.  Keeping the tension on the exercise band, pull it away from your body, as if pivoting on your elbow. Be sure to keep your body steady, so that the movement is coming only from your rotating shoulder.  Hold for __________ seconds. Release the tension in a controlled manner, as you return to the starting position. Repeat __________ times. Complete this exercise __________ times per day.  STRENGTH - Internal Rotators   Secure a rubber exercise band or tubing to a fixed object (table, pole) so that it is at the same height as your right / left elbow when you are standing or sitting on a firm surface.  Stand or sit so that the secured exercise band is at your right /  left side.  Bend your elbow 90 degrees. Place a folded towel or small pillow under your right / left arm so that your elbow is a few inches away from your side.  Keeping the tension on the exercise band, pull it across your body, toward your stomach. Be sure to keep your body steady, so that the movement is coming only from your rotating shoulder.  Hold for __________ seconds. Release the tension in a controlled manner, as you return to the starting position. Repeat __________ times. Complete this exercise __________ times per day.  STRENGTH - Scapular Protractors, Standing   Stand arms length away from a wall. Place your hands on the wall, keeping your elbows straight.  Begin by dropping your shoulder blades down and toward your mid-back spine.  To strengthen your protractors, keep your shoulder blades down, but slide them forward on your rib cage. It will feel as if you are lifting the back of your rib cage away from the wall. This is a subtle motion and can be challenging to complete. Ask your caregiver for further instruction, if you are not sure you are doing the exercise correctly.  Hold for __________ seconds. Slowly return to the starting position, resting the muscles completely before starting the next repetition. Repeat __________ times. Complete this exercise __________ times per day. STRENGTH - Scapular Protractors, Supine  Lie on your back on a firm surface. Extend your right / left arm straight into the air while holding a __________ weight in your hand.  Keeping your head and back in place, lift your shoulder off the floor.  Hold for __________ seconds. Slowly return to the starting position, and allow your muscles to relax completely before starting the next repetition. Repeat __________ times. Complete this exercise __________ times per day. STRENGTH - Scapular Protractors, Quadruped  Get onto your hands and knees, with your shoulders directly over your hands (or as close  as you can be, comfortably).  Keeping your elbows locked, lift the back of your rib cage up into your shoulder blades, so your mid-back rounds out. Keep your neck muscles relaxed.  Hold this position for __________ seconds. Slowly return to the starting position and allow your muscles to  relax completely before starting the next repetition. Repeat __________ times. Complete this exercise __________ times per day.  STRENGTH - Scapular Retractors  Secure a rubber exercise band or tubing to a fixed object (table, pole), so that it is at the height of your shoulders when you are either standing, or sitting on a firm armless chair.  With a palm down grip, grasp an end of the band in each hand. Straighten your elbows and lift your hands straight in front of you, at shoulder height. Step back, away from the secured end of the band, until it becomes tense.  Squeezing your shoulder blades together, draw your elbows back toward your sides, as you bend them. Keep your upper arms lifted away from your body throughout the exercise.  Hold for __________ seconds. Slowly ease the tension on the band, as you reverse the directions and return to the starting position. Repeat __________ times. Complete this exercise __________ times per day. STRENGTH - Shoulder Extensors   Secure a rubber exercise band or tubing to a fixed object (table, pole) so that it is at the height of your shoulders when you are either standing, or sitting on a firm armless chair.  With a thumbs-up grip, grasp an end of the band in each hand. Straighten your elbows and lift your hands straight in front of you, at shoulder height. Step back, away from the secured end of the band, until it becomes tense.  Squeezing your shoulder blades together, pull your hands down to the sides of your thighs. Do not allow your hands to go behind you.  Hold for __________ seconds. Slowly ease the tension on the band, as you reverse the directions and  return to the starting position. Repeat __________ times. Complete this exercise __________ times per day.  STRENGTH - Scapular Retractors and External Rotators   Secure a rubber exercise band or tubing to a fixed object (table, pole) so that it is at the height as your shoulders, when you are either standing, or sitting on a firm armless chair.  With a palm down grip, grasp an end of the band in each hand. Bend your elbows 90 degrees and lift your elbows to shoulder height, at your sides. Step back, away from the secured end of the band, until it becomes tense.  Squeezing your shoulder blades together, rotate your shoulders so that your upper arms and elbows remain stationary, but your fists travel upward to head height.  Hold for __________ seconds. Slowly ease the tension on the band, as you reverse the directions and return to the starting position. Repeat __________ times. Complete this exercise __________ times per day.  STRENGTH - Scapular Retractors and External Rotators, Rowing   Secure a rubber exercise band or tubing to a fixed object (table, pole) so that it is at the height of your shoulders, when you are either standing, or sitting on a firm armless chair.  With a palm down grip, grasp an end of the band in each hand. Straighten your elbows and lift your hands straight in front of you, at shoulder height. Step back, away from the secured end of the band, until it becomes tense.  Step 1: Squeeze your shoulder blades together. Bending your elbows, draw your hands to your chest, as if you are rowing a boat. At the end of this motion, your hands and elbow should be at shoulder height and your elbows should be out to your sides.  Step 2: Rotate your shoulders, to  raise your hands above your head. Your forearms should be vertical and your upper arms should be horizontal.  Hold for __________ seconds. Slowly ease the tension on the band, as you reverse the directions and return to the  starting position. Repeat __________ times. Complete this exercise __________ times per day.  STRENGTH - Scapular Depressors  Find a sturdy chair without wheels, such as a dining room chair.  Keeping your feet on the floor, and your hands on the chair arms, lift your bottom up from the seat, and lock your elbows.  Keeping your elbows straight, allow gravity to pull your body weight down. Your shoulders will rise toward your ears.  Raise your body against gravity by drawing your shoulder blades down your back, shortening the distance between your shoulders and ears. Although your feet should always maintain contact with the floor, your feet should progressively support less body weight, as you get stronger.  Hold for __________ seconds. In a controlled and slow manner, lower your body weight to begin the next repetition. Repeat __________ times. Complete this exercise __________ times per day.    This information is not intended to replace advice given to you by your health care provider. Make sure you discuss any questions you have with your health care provider.   Document Released: 08/24/2005 Document Revised: 09/14/2014 Document Reviewed: 12/06/2008 Elsevier Interactive Patient Education [employer].

## 2016-06-22 NOTE — Assessment & Plan Note (Signed)
Symptoms and exam most consistent rotator cuff impingement. Given exercises. If no improvement with this would consider physical therapy or sports medicine referral.

## 2016-06-22 NOTE — Assessment & Plan Note (Signed)
Not at goal. He'll continue his current medications we'll check an A1c today prior to making any changes.

## 2016-06-26 ENCOUNTER — Telehealth: Payer: Self-pay

## 2016-06-26 NOTE — Telephone Encounter (Signed)
Pt coming for fasting labs 06/29/16. Patient just had a couple labs done 06/22/16. Please advise if patient needs to come back for more labs on Monday.

## 2016-06-26 NOTE — Telephone Encounter (Signed)
Patient does not need labs. Thanks.

## 2016-06-26 NOTE — Telephone Encounter (Signed)
Noted. Pt is aware 

## 2016-06-29 ENCOUNTER — Ambulatory Visit (INDEPENDENT_AMBULATORY_CARE_PROVIDER_SITE_OTHER): Payer: BLUE CROSS/BLUE SHIELD

## 2016-06-29 ENCOUNTER — Other Ambulatory Visit: Payer: BLUE CROSS/BLUE SHIELD

## 2016-06-29 VITALS — BP 140/70 | HR 70 | Resp 18

## 2016-06-29 DIAGNOSIS — I10 Essential (primary) hypertension: Secondary | ICD-10-CM

## 2016-06-29 NOTE — Progress Notes (Signed)
BP check ordered.  Patient brought his own machine from home and readings for the PCP to review.  Gave readings to MA to give to PCP to review, in red folder.    Checked bilateral upper extremities, see vitals for details.    Home cuff was 152/90 with heart rate 67 in the office on left arm.    Please advise.

## 2016-07-02 ENCOUNTER — Other Ambulatory Visit: Payer: Self-pay | Admitting: Family Medicine

## 2016-07-02 MED ORDER — METFORMIN HCL ER (MOD) 1000 MG PO TB24: 2000.0000 mg | ORAL_TABLET | Freq: Every day | ORAL | 1 refills | Status: DC

## 2016-07-02 NOTE — Progress Notes (Signed)
Blood pressure in the office not quite at goal. Blood pressures at home tend to be higher than would be apparent in the office. He would benefit from increasing his lisinopril to 20 mg or adding an additional blood pressure medication such as amlodipine. Please see which he would like to do. If we change the lisinopril dose he will need to come back in 1 week for lab work.  [name], M.D.

## 2016-07-03 MED ORDER — LISINOPRIL 20 MG PO TABS: 20.0000 mg | ORAL_TABLET | Freq: Every day | ORAL | 0 refills | Status: DC

## 2016-07-03 NOTE — Progress Notes (Signed)
New medication sent to pharmacy

## 2016-07-03 NOTE — Addendum Note (Signed)
Addended by: Leone Haven on: 07/03/2016 04:40 PM   Modules accepted: Orders

## 2016-07-06 ENCOUNTER — Telehealth: Payer: Self-pay | Admitting: *Deleted

## 2016-07-06 NOTE — Telephone Encounter (Signed)
Spoke with patient and it is not a fasting lab. Please place order for labs

## 2016-07-06 NOTE — Telephone Encounter (Signed)
Pt would like to know if his return lab will be fasting ?

## 2016-07-07 ENCOUNTER — Telehealth: Payer: Self-pay

## 2016-07-07 DIAGNOSIS — I10 Essential (primary) hypertension: Secondary | ICD-10-CM

## 2016-07-07 NOTE — Telephone Encounter (Signed)
Orders placed.

## 2016-07-07 NOTE — Telephone Encounter (Signed)
Pt coming for repeat lab 07/08/16. Please place future order. Thank you.

## 2016-07-08 ENCOUNTER — Telehealth: Payer: Self-pay | Admitting: Family Medicine

## 2016-07-08 ENCOUNTER — Other Ambulatory Visit (INDEPENDENT_AMBULATORY_CARE_PROVIDER_SITE_OTHER): Payer: BLUE CROSS/BLUE SHIELD

## 2016-07-08 DIAGNOSIS — I10 Essential (primary) hypertension: Secondary | ICD-10-CM | POA: Diagnosis not present

## 2016-07-08 LAB — BASIC METABOLIC PANEL
BUN: 15 mg/dL (ref 6–23)
CHLORIDE: 105 meq/L (ref 96–112)
CO2: 27 mEq/L (ref 19–32)
Calcium: 9.3 mg/dL (ref 8.4–10.5)
Creatinine, Ser: 1.13 mg/dL (ref 0.40–1.50)
GFR: 70.98 mL/min (ref >60.00)
GLUCOSE: 210 mg/dL — AB (ref 70–99)
POTASSIUM: 4.5 meq/L (ref 3.5–5.1)
SODIUM: 140 meq/L (ref 135–145)

## 2016-07-08 NOTE — Telephone Encounter (Signed)
Pt dropped off blood pressure readings. I will place readings in Dr. Ellen Henri mail box.

## 2016-07-10 NOTE — Telephone Encounter (Signed)
Patients blood pressure is still too high at home. I would suggest increasing the lisinopril again to 40 mg or adding an additional BP medication such as amlodipine. Please see what the patient would like to do. Thanks.

## 2016-07-10 NOTE — Telephone Encounter (Signed)
Patient will increase the lisinopril to 40 mg

## 2016-07-10 NOTE — Telephone Encounter (Signed)
Form on your desk  

## 2016-07-10 NOTE — Telephone Encounter (Signed)
Noted. Patient needs to return for lab work next Friday given that we are increasing his lisinopril. Thanks.

## 2016-07-13 ENCOUNTER — Other Ambulatory Visit: Payer: Self-pay | Admitting: Family Medicine

## 2016-07-13 DIAGNOSIS — I10 Essential (primary) hypertension: Secondary | ICD-10-CM

## 2016-07-13 MED ORDER — LISINOPRIL 40 MG PO TABS: ORAL_TABLET | ORAL | 1 refills | Status: DC

## 2016-07-13 NOTE — Telephone Encounter (Signed)
Pt called and stated since Dr. Caryl Bis increased his medication for lisinopril (PRINIVIL,ZESTRIL) 20 MG tablet pt is out of medication as of today. Could be please send over to to local pharmacy? Can we adjust the rx for the mail away [address], [address], [place]  Call pt @ [phone]

## 2016-07-13 NOTE — Telephone Encounter (Signed)
Sent to pharmacy. Patient needs a lab appointment to recheck kidney function 1 week after increasing his dose of lisinopril. Please set this up for him. Thanks.

## 2016-07-13 NOTE — Telephone Encounter (Signed)
Sent new RX in to pharmacy lisinopril 40 mg take 1 daily to pharmacy. Notified patient

## 2016-07-14 NOTE — Telephone Encounter (Signed)
Can you schedule patient for lab appointment please.

## 2016-07-15 ENCOUNTER — Telehealth: Payer: Self-pay | Admitting: *Deleted

## 2016-07-15 NOTE — Telephone Encounter (Signed)
Notified patient that RX was sent to pharmacy on 07/13/16.

## 2016-07-15 NOTE — Telephone Encounter (Signed)
FYI Pt stated that he had his lisinopril 20 mg filled and will call at a later time to have his 40 mg tablets sent to the pharmacy

## 2016-07-27 ENCOUNTER — Other Ambulatory Visit: Payer: Self-pay | Admitting: Family Medicine

## 2016-07-28 NOTE — Telephone Encounter (Signed)
Please advise 

## 2016-07-28 NOTE — Telephone Encounter (Signed)
Please determine what the patient is taking this for. This is typically just an acute medication. Last time it was filled was in June. Thanks.

## 2016-07-29 NOTE — Telephone Encounter (Signed)
Patient has appointment on 08/06/16. Will discuss at appoitnment

## 2016-08-06 ENCOUNTER — Telehealth: Payer: Self-pay | Admitting: *Deleted

## 2016-08-06 ENCOUNTER — Ambulatory Visit: Payer: BLUE CROSS/BLUE SHIELD | Admitting: Family Medicine

## 2016-08-06 ENCOUNTER — Ambulatory Visit (INDEPENDENT_AMBULATORY_CARE_PROVIDER_SITE_OTHER): Payer: BLUE CROSS/BLUE SHIELD | Admitting: Family Medicine

## 2016-08-06 ENCOUNTER — Encounter: Payer: Self-pay | Admitting: Family Medicine

## 2016-08-06 VITALS — BP 168/94 | HR 70 | Temp 98.4°F | Wt 187.4 lb

## 2016-08-06 DIAGNOSIS — I10 Essential (primary) hypertension: Secondary | ICD-10-CM

## 2016-08-06 DIAGNOSIS — Z794 Long term (current) use of insulin: Secondary | ICD-10-CM

## 2016-08-06 DIAGNOSIS — M7542 Impingement syndrome of left shoulder: Secondary | ICD-10-CM

## 2016-08-06 DIAGNOSIS — M79642 Pain in left hand: Secondary | ICD-10-CM | POA: Diagnosis not present

## 2016-08-06 DIAGNOSIS — E1165 Type 2 diabetes mellitus with hyperglycemia: Secondary | ICD-10-CM | POA: Diagnosis not present

## 2016-08-06 DIAGNOSIS — IMO0001 Reserved for inherently not codable concepts without codable children: Secondary | ICD-10-CM

## 2016-08-06 MED ORDER — LISINOPRIL 40 MG PO TABS: ORAL_TABLET | ORAL | 1 refills | Status: DC

## 2016-08-06 MED ORDER — INSULIN ISOPHANE HUMAN 100 UNIT/ML KWIKPEN: 19.0000 [IU] | PEN_INJECTOR | Freq: Every day | SUBCUTANEOUS | 3 refills | Status: DC

## 2016-08-06 MED ORDER — AMLODIPINE BESYLATE 5 MG PO TABS: 5.0000 mg | ORAL_TABLET | Freq: Every day | ORAL | 3 refills | Status: DC

## 2016-08-06 NOTE — Progress Notes (Signed)
Jonathan Rumps, MD Phone: [phone]  Jonathan West is a 55 y.o. male who presents today for follow-up.  DIABETES Disease Monitoring: Blood Sugar ranges-86-200 Polyuria/phagia/dipsia- no      Medications: Compliance- taking metformin 2000 mg daily, Amaryl, Januvia, and NPH 17 units daily, does note some mild diarrhea with the metformin Hypoglycemic symptoms- no  HYPERTENSION  Disease Monitoring  Home BP Monitoring [id number] systolically, mostly in the 140s-150s Chest pain- no    Dyspnea- no Medications  Compliance-  taking lisinopril 40 mg daily, propranolol  Edema- no  Rotator cuff impingement: Patient notes continued issues with his left rotator cuff. Notes he has been exercising some with a towel and cane to help with this and these have helped some. Notes the discomfort is not worse though not significantly better. Notes discomfort on abduction of his shoulder.  He notes some discomfort at the base of his left palm in a pinpoint area only when he bends over to reach down with his left arm. Notes it is a sharp discomfort and it resolves as soon as he changes positions. Does not happen every time he bends over. No other symptoms with this.  PMH: nonsmoker.   ROS see history of present illness  Objective  Physical Exam Vitals:   08/06/16 1607  BP: (!) 168/94  Pulse: 70  Temp: 98.4 F (36.9 C)    BP Readings from Last 3 Encounters:  08/06/16 (!) 168/94  06/29/16 140/70  06/22/16 (!) 150/96   Wt Readings from Last 3 Encounters:  08/06/16 187 lb 6.4 oz (85 kg)  06/22/16 188 lb 3.2 oz (85.4 kg)  02/18/16 188 lb 6.4 oz (85.5 kg)    Physical Exam  Constitutional: No distress.  Cardiovascular: Normal rate, regular rhythm and normal heart sounds.   Pulmonary/Chest: Effort normal.  Musculoskeletal: He exhibits no edema.  Bilateral shoulders with no tenderness or swelling, full range of motion bilateral shoulders actively and passively, discomfort on active abduction  of the left shoulder, mild discomfort on internal and external rotation left shoulder, positive empty can on the left, negative empty can on the right, left and right palms with no tenderness or swelling, negative Tinel's bilaterally  Neurological: He is alert.  Skin: He is not diaphoretic.     Assessment/Plan: Please see individual problem list.  Essential hypertension Not at goal. Asymptomatic. He'll continue propranolol and lisinopril. We'll add amlodipine. We'll check a BMP today.  DM (diabetes mellitus), type 2, uncontrolled Still appears to be uncontrolled. We'll increase his NPH to 19 units nightly. He'll continue metformin, Amaryl, and Januvia. I gave him the option decrease metformin though he decided to hold off on this at this time.  Rotator cuff impingement syndrome, left Continues to have issues with this. We will refer to sports medicine for further evaluation and potential ultrasound.  Hand pain, left Patient has a very specific pinpoint area of discomfort at the base of his left palm. Benign exam today. I'm unsure of the cause of this time. Given that it happens infrequently and he has no other associated symptoms he will continue to monitor. If it becomes were frequent or changes he'll let us know.   Orders Placed This Encounter  Procedures  . Basic Metabolic Panel (BMET)    Standing Status:   Future    Standing Expiration Date:   08/06/2017  . Ambulatory referral to Sports Medicine    Referral Priority:   Routine    Referral Type:   Consultation  Number of Visits Requested:   1    Meds ordered this encounter  Medications  . lisinopril (PRINIVIL,ZESTRIL) 40 MG tablet    Sig: Take 1 tablet daily    Dispense:  90 tablet    Refill:  1  . Insulin NPH, Human,, Isophane, (HUMULIN N KWIKPEN) 100 UNIT/ML Kiwkpen    Sig: Inject 19 Units into the skin at bedtime.    Dispense:  15 mL    Refill:  3  . amLODipine (NORVASC) 5 MG tablet    Sig: Take 1 tablet (5 mg  total) by mouth daily.    Dispense:  90 tablet    Refill:  3   He'll follow-up in 2 weeks for blood pressure check with the pharmacist and further management of blood pressure medication.  Jonathan Rumps, MD [place]

## 2016-08-06 NOTE — Assessment & Plan Note (Signed)
Continues to have issues with this. We will refer to sports medicine for further evaluation and potential ultrasound.

## 2016-08-06 NOTE — Assessment & Plan Note (Signed)
Still appears to be uncontrolled. We'll increase his NPH to 19 units nightly. He'll continue metformin, Amaryl, and Januvia. I gave him the option decrease metformin though he decided to hold off on this at this time.

## 2016-08-06 NOTE — Assessment & Plan Note (Signed)
Not at goal. Asymptomatic. He'll continue propranolol and lisinopril. We'll add amlodipine. We'll check a BMP today.

## 2016-08-06 NOTE — Assessment & Plan Note (Signed)
Patient has a very specific pinpoint area of discomfort at the base of his left palm. Benign exam today. I'm unsure of the cause of this time. Given that it happens infrequently and he has no other associated symptoms he will continue to monitor. If it becomes were frequent or changes he'll let us know.

## 2016-08-06 NOTE — Progress Notes (Signed)
Pre visit review using our clinic review tool, if applicable. No additional management support is needed unless otherwise documented below in the visit note. 

## 2016-08-06 NOTE — Patient Instructions (Signed)
Nice to see you. We are going to add amlodipine to your blood pressure medication. Continue propranolol and lisinopril. Where an increase your insulin dose (NPH) to 19 units daily. We will refer you to sports medicine for your shoulder as well.

## 2016-08-07 ENCOUNTER — Other Ambulatory Visit: Payer: Self-pay | Admitting: Family Medicine

## 2016-08-07 ENCOUNTER — Other Ambulatory Visit: Payer: BLUE CROSS/BLUE SHIELD

## 2016-08-08 ENCOUNTER — Other Ambulatory Visit: Payer: Self-pay | Admitting: Internal Medicine

## 2016-08-08 ENCOUNTER — Other Ambulatory Visit: Payer: Self-pay | Admitting: Family Medicine

## 2016-08-10 ENCOUNTER — Other Ambulatory Visit (INDEPENDENT_AMBULATORY_CARE_PROVIDER_SITE_OTHER): Payer: BLUE CROSS/BLUE SHIELD

## 2016-08-10 DIAGNOSIS — I10 Essential (primary) hypertension: Secondary | ICD-10-CM | POA: Diagnosis not present

## 2016-08-10 LAB — BASIC METABOLIC PANEL
BUN: 16 mg/dL (ref 6–23)
CALCIUM: 9.1 mg/dL (ref 8.4–10.5)
CO2: 28 meq/L (ref 19–32)
CREATININE: 1.18 mg/dL (ref 0.40–1.50)
Chloride: 105 mEq/L (ref 96–112)
GFR: 67.5 mL/min (ref >60.00)
GLUCOSE: 156 mg/dL — AB (ref 70–99)
Potassium: 4.1 mEq/L (ref 3.5–5.1)
Sodium: 141 mEq/L (ref 135–145)

## 2016-08-11 NOTE — Telephone Encounter (Signed)
Opened in error

## 2016-08-24 ENCOUNTER — Encounter: Payer: Self-pay | Admitting: Pharmacist

## 2016-08-24 ENCOUNTER — Ambulatory Visit (INDEPENDENT_AMBULATORY_CARE_PROVIDER_SITE_OTHER): Payer: BLUE CROSS/BLUE SHIELD | Admitting: Pharmacist

## 2016-08-24 VITALS — BP 133/90 | HR 66 | Wt 186.0 lb

## 2016-08-24 DIAGNOSIS — I10 Essential (primary) hypertension: Secondary | ICD-10-CM | POA: Diagnosis not present

## 2016-08-24 DIAGNOSIS — Z794 Long term (current) use of insulin: Secondary | ICD-10-CM | POA: Diagnosis not present

## 2016-08-24 DIAGNOSIS — E1165 Type 2 diabetes mellitus with hyperglycemia: Secondary | ICD-10-CM

## 2016-08-24 DIAGNOSIS — IMO0001 Reserved for inherently not codable concepts without codable children: Secondary | ICD-10-CM

## 2016-08-24 MED ORDER — INSULIN ISOPHANE HUMAN 100 UNIT/ML KWIKPEN: 19.0000 [IU] | PEN_INJECTOR | Freq: Every day | SUBCUTANEOUS | 3 refills | Status: DC

## 2016-08-24 MED ORDER — AMLODIPINE BESYLATE 10 MG PO TABS: 10.0000 mg | ORAL_TABLET | Freq: Every day | ORAL | 3 refills | Status: DC

## 2016-08-24 MED ORDER — EMPAGLIFLOZIN 10 MG PO TABS: 10.0000 mg | ORAL_TABLET | Freq: Every day | ORAL | 0 refills | Status: DC

## 2016-08-24 NOTE — Patient Instructions (Addendum)
Increase Amlodipine to 10 mg daily (You may take two of 5 mg tablets until you finish your current supply) Start Jardiance 10 mg daily  Please check your blood glucose 2 times daily (fasting before breakfast and alternate 2 hours after lunch and dinner) Please continue to check your blood pressure at home.  Call clinic if you continue to have elevated blood pressure or blood glucose readings.

## 2016-08-24 NOTE — Progress Notes (Signed)
I have reviewed the above note and agree with the plan. I saw the patient in the office with the pharmacist.  [name], M.D.

## 2016-08-24 NOTE — Progress Notes (Signed)
Patient arrives in good spirits ambulating without assistance.   He presents to pharmacy clinic for hypertension/diabetes evaluation and management at the request of Dr Caryl Bis on 08/06/16.  Patient was last seen by Dr Caryl Bis on 08/06/16.     Patient reports adherence with medications. He states he has not had his blood pressure medications this morning Compared home blood pressure cuff to office cuff and home BP cuff in office was 129/85 which is comparable to office BP cuff today.   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  Lifting is limited by shoulder injury where he is referred to orhthopedics.   Diet:  Breakfast: [place] - gravy biscuit, scrambled eggs, tomato Lunch:Often skips.  Roasted [place] sandwich or pack of crackers or occasionally an order of french fries  Dinner: [employer] dumplings and mashed potatoes, chicken sandwich from [employer]  Drinks - Unsweet tea Snacks: Occasional candy bar or ice cream  Patient reports he doesn't add salt to his food.    Patient reports home blood pressure readings (mostly in morning) 12/16 141/80, 161/95 12/15 151/86 12/14 160/88 12/11 148/75 12/10 151/94 12/7 168/88  Patient reports CBGs mostly 160-180 mg/dL.  Reports fasting CBG 209 mg/dL.  Patient reports checking FBG and post prandial   Denies neuropathy.  Reports nocturia 1x/night. Denies pain/burning upon urination. Denies vision changes Reports self foot exams.  Denies changes.     O:  Last 3 Office BP readings: BP Readings from Last 3 Encounters:  08/24/16 133/90  08/06/16 (!) 168/94  06/29/16 140/70    BMET    Component Value Date/Time   NA 141  08/10/2016 0804   K 4.1 08/10/2016 0804   CL 105 08/10/2016 0804   CO2 28 08/10/2016 0804   GLUCOSE 156 (H) 08/10/2016 0804   BUN 16 08/10/2016 0804   CREATININE 1.18 08/10/2016 0804   CALCIUM 9.1 08/10/2016 0804   GFRNONAA >60 08/15/2015 0856   GFRAA >60 08/15/2015 0856    A/P: Hypertension which currently is controlled on current medications during todays visit but home BP readings range from [id number] mm Hg systolic.  Patient reports adherence to current regimen and lack of control may be due to diet.   Following discussion with Dr Caryl Bis the following medication changes were made:  Will increase Amlodipine to 10 mg daily.  Counseled patient on side effects including lower extremity edema.  Discussed low carbohydrate/low sodium diet and increasing exercise.  Instructed patient to continue to self monitor BP at home and call clinic if these remain elevated. Would likely consider thiazide or switching propranolol to carvedilol at next visit if BP remains elevated.  Patient has been on propranolol for migraine prophylaxis but carvediolol does have some data for prophylaxis.   Diabetes currently uncontrolled on current regimen.  Lack of control may be due to frequent fast food with busy work life. Counseled on low carbohydrate/low sodium diet and increasing exercise.   Following discussion with Dr Caryl Bis the following medication changes were made:  Will start Jardiance 10 mg daily.  Discussed CVD/HTN benefit of SGLT2 inhibitors and side effects.  Instructed patient to check CBGs fasting and alternate 2 hours post lunch/dinner.  Refill of Insulin NPH was sent  to patients mail order pharmacy.    Results reviewed and written information provided.   F/U Clinic Visit with Dr. Caryl Bis in February. Patient to call clinic if CBG or home BP readings remain elevated.  Total time in face-to-face counseling 60 minutes.    Patient was seen with Dr Caryl Bis in clinic today and medication changes  were discussed prior to initiation.  Bennye Alm, PharmD [facility] PGY2 Pharmacy Resident [phone]

## 2016-08-25 DIAGNOSIS — M7542 Impingement syndrome of left shoulder: Secondary | ICD-10-CM | POA: Diagnosis not present

## 2016-10-01 ENCOUNTER — Other Ambulatory Visit: Payer: Self-pay | Admitting: Family Medicine

## 2016-10-12 ENCOUNTER — Encounter: Payer: Self-pay | Admitting: Family Medicine

## 2016-10-12 ENCOUNTER — Ambulatory Visit (INDEPENDENT_AMBULATORY_CARE_PROVIDER_SITE_OTHER): Payer: BLUE CROSS/BLUE SHIELD | Admitting: Family Medicine

## 2016-10-12 VITALS — BP 120/78 | HR 96 | Temp 98.4°F | Wt 182.2 lb

## 2016-10-12 DIAGNOSIS — I10 Essential (primary) hypertension: Secondary | ICD-10-CM

## 2016-10-12 DIAGNOSIS — Z794 Long term (current) use of insulin: Secondary | ICD-10-CM | POA: Diagnosis not present

## 2016-10-12 DIAGNOSIS — M7542 Impingement syndrome of left shoulder: Secondary | ICD-10-CM

## 2016-10-12 DIAGNOSIS — E1165 Type 2 diabetes mellitus with hyperglycemia: Secondary | ICD-10-CM

## 2016-10-12 DIAGNOSIS — IMO0001 Reserved for inherently not codable concepts without codable children: Secondary | ICD-10-CM

## 2016-10-12 LAB — POCT GLYCOSYLATED HEMOGLOBIN (HGB A1C): HEMOGLOBIN A1C: 7.4

## 2016-10-12 MED ORDER — LISINOPRIL 40 MG PO TABS: ORAL_TABLET | ORAL | 1 refills | Status: DC

## 2016-10-12 NOTE — Progress Notes (Signed)
Pre visit review using our clinic review tool, if applicable. No additional management support is needed unless otherwise documented below in the visit note. 

## 2016-10-12 NOTE — Assessment & Plan Note (Signed)
Quite a bit improved. We'll provide with exercises.

## 2016-10-12 NOTE — Patient Instructions (Signed)
Nice to see you. Please continue current medications. Please do the following exercises.   Shoulder Impingement Syndrome Rehab Ask your health care provider which exercises are safe for you. Do exercises exactly as told by your health care provider and adjust them as directed. It is normal to feel mild stretching, pulling, tightness, or discomfort as you do these exercises, but you should stop right away if you feel sudden pain or your pain gets worse.Do not begin these exercises until told by your health care provider. Stretching and range of motion exercise This exercise warms up your muscles and joints and improves the movement and flexibility of your shoulder. This exercise also helps to relieve pain and stiffness. Exercise A: Passive horizontal adduction 1. Sit or stand and pull your left / right elbow across your chest, toward your other shoulder. Stop when you feel a gentle stretch in the back of your shoulder and upper arm.  Keep your arm at shoulder height.  Keep your arm as close to your body as you comfortably can. 2. Hold for __________ seconds. 3. Slowly return to the starting position. Repeat __________ times. Complete this exercise __________ times a day. Strengthening exercises These exercises build strength and endurance in your shoulder. Endurance is the ability to use your muscles for a long time, even after they get tired. Exercise B: External rotation, isometric 1. Stand or sit in a doorway, facing the door frame. 2. Bend your left / right elbow and place the back of your wrist against the door frame. Only your wrist should be touching the frame. Keep your upper arm at your side. 3. Gently press your wrist against the door frame, as if you are trying to push your arm away from your abdomen.  Avoid shrugging your shoulder while you press your hand against the door frame. Keep your shoulder blade tucked down toward the middle of your back. 4. Hold for __________  seconds. 5. Slowly release the tension, and relax your muscles completely before you do the exercise again. Repeat __________ times. Complete this exercise __________ times a day. Exercise C: Internal rotation, isometric 1. Stand or sit in a doorway, facing the door frame. 2. Bend your left / right elbow and place the inside of your wrist against the door frame. Only your wrist should be touching the frame. Keep your upper arm at your side. 3. Gently press your wrist against the door frame, as if you are trying to push your arm toward your abdomen.  Avoid shrugging your shoulder while you press your hand against the door frame. Keep your shoulder blade tucked down toward the middle of your back. 4. Hold for __________ seconds. 5. Slowly release the tension, and relax your muscles completely before you do the exercise again. Repeat __________ times. Complete this exercise __________ times a day. Exercise D: Scapular protraction, supine 1. Lie on your back on a firm surface. Hold a __________ weight in your left / right hand. 2. Raise your left / right arm straight into the air so your hand is directly above your shoulder joint. 3. Push the weight into the air so your shoulder lifts off of the surface that you are lying on. Do not move your head, neck, or back. 4. Hold for __________ seconds. 5. Slowly return to the starting position. Let your muscles relax completely before you repeat this exercise. Repeat __________ times. Complete this exercise __________ times a day. Exercise E: Scapular retraction 1. Sit in a stable chair without  armrests, or stand. 2. Secure an exercise band to a stable object in front of you so the band is at shoulder height. 3. Hold one end of the exercise band in each hand. Your palms should face down. 4. Squeeze your shoulder blades together and move your elbows slightly behind you. Do not shrug your shoulders while you do this. 5. Hold for __________  seconds. 6. Slowly return to the starting position. Repeat __________ times. Complete this exercise __________ times a day. Exercise F: Shoulder extension 1. Sit in a stable chair without armrests, or stand. 2. Secure an exercise band to a stable object in front of you where the band is above shoulder height. 3. Hold one end of the exercise band in each hand. 4. Straighten your elbows and lift your hands up to shoulder height. 5. Squeeze your shoulder blades together and pull your hands down to the sides of your thighs. Stop when your hands are straight down by your sides. Do not let your hands go behind your body. 6. Hold for __________ seconds. 7. Slowly return to the starting position. Repeat __________ times. Complete this exercise __________ times a day. This information is not intended to replace advice given to you by your health care provider. Make sure you discuss any questions you have with your health care provider. Document Released: 08/24/2005 Document Revised: 04/30/2016 Document Reviewed: 07/27/2015 Elsevier Interactive Patient Education  2017 [employer].

## 2016-10-12 NOTE — Progress Notes (Signed)
  Jonathan Rumps, MD Phone: [phone]  Jonathan West is a 56 y.o. male who presents today for f/u.  HYPERTENSION  Disease Monitoring  Home BP Monitoring not checking recently Chest pain- no    Dyspnea- no Medications  Compliance-  Taking lisinopril, propranolol, amlodipine.  Edema- no  DIABETES Disease Monitoring: Blood Sugar ranges-90's-130's the past several weeks with occasional up to 190's Polyuria/phagia/dipsia- no      optho- not seen yet Medications: Compliance- taking Shanda Bumps, Januvia, NPH 19 u, glimepiride, Jardiance Hypoglycemic symptoms- no  Left shoulder pain: Patient saw orthopedics for rotator cuff impingement. He had cortisone shot in his left shoulder with meloxicam as well. This improved quite a bit though was sore yesterday. Can abduct his arm fully with no pain at this time.  PMH: nonsmoker.   ROS see history of present illness  Objective  Physical Exam Vitals:   10/12/16 0801  BP: 120/78  Pulse: 96  Temp: 98.4 F (36.9 C)    BP Readings from Last 3 Encounters:  10/12/16 120/78  08/24/16 133/90  08/06/16 (!) 168/94   Wt Readings from Last 3 Encounters:  10/12/16 182 lb 3.2 oz (82.6 kg)  08/24/16 186 lb (84.4 kg)  08/06/16 187 lb 6.4 oz (85 kg)    Physical Exam  Constitutional: He is well-developed, well-nourished, and in no distress.  HENT:  Head: Normocephalic and atraumatic.  Cardiovascular: Normal rate, regular rhythm and normal heart sounds.   Pulmonary/Chest: Effort normal and breath sounds normal.  Musculoskeletal:  Bilateral there's symmetric with no tenderness, full range of motion actively and passively with no discomfort, mild discomfort on left empty can, negative drop arm test bilaterally  Neurological: He is alert. Gait normal.  Skin: Skin is warm and dry.     Assessment/Plan: Please see individual problem list.  Essential hypertension At goal. Encouraged him to start monitoring his blood pressure at home. Refill  lisinopril given.  DM (diabetes mellitus), type 2, uncontrolled Seems to be better controlled with addition of Jardiance. A1c much improved. No changes made to medications. Encouraged to watch his diet.  Rotator cuff impingement syndrome, left Quite a bit improved. We'll provide with exercises.   Orders Placed This Encounter  Procedures  . POCT HgB A1C    Jonathan Rumps, MD [place]

## 2016-10-12 NOTE — Assessment & Plan Note (Signed)
At goal. Encouraged him to start monitoring his blood pressure at home. Refill lisinopril given.

## 2016-10-12 NOTE — Assessment & Plan Note (Addendum)
Seems to be better controlled with addition of Jardiance. A1c much improved. No changes made to medications. Encouraged to watch his diet.

## 2016-10-13 ENCOUNTER — Encounter: Payer: Self-pay | Admitting: Family Medicine

## 2016-10-17 ENCOUNTER — Other Ambulatory Visit: Payer: Self-pay | Admitting: Family Medicine

## 2016-10-17 DIAGNOSIS — E1165 Type 2 diabetes mellitus with hyperglycemia: Principal | ICD-10-CM

## 2016-10-17 DIAGNOSIS — Z794 Long term (current) use of insulin: Principal | ICD-10-CM

## 2016-10-17 DIAGNOSIS — IMO0001 Reserved for inherently not codable concepts without codable children: Secondary | ICD-10-CM

## 2016-10-18 ENCOUNTER — Encounter: Payer: Self-pay | Admitting: Family Medicine

## 2016-10-19 ENCOUNTER — Other Ambulatory Visit: Payer: Self-pay | Admitting: Family Medicine

## 2016-10-19 MED ORDER — METFORMIN HCL ER (MOD) 1000 MG PO TB24: 2000.0000 mg | ORAL_TABLET | Freq: Every day | ORAL | 1 refills | Status: DC

## 2016-10-19 NOTE — Telephone Encounter (Signed)
Last OV 10/12/16 last filled 08/07/16 270 0rf

## 2016-10-27 NOTE — Telephone Encounter (Signed)
Pt would like to add optum Rx  Direct call line [phone]

## 2016-10-27 NOTE — Telephone Encounter (Signed)
Pt called and stated that he is going to run out of his Metformin on Thursday and needs to have a prior authorization done for this medication. Please advise, thank you!  Call pt @ [phone] (after [phone]

## 2016-10-27 NOTE — Telephone Encounter (Signed)
Please advise 

## 2016-11-08 ENCOUNTER — Encounter: Payer: Self-pay | Admitting: Family Medicine

## 2016-11-12 ENCOUNTER — Other Ambulatory Visit: Payer: Self-pay | Admitting: Family Medicine

## 2016-11-12 DIAGNOSIS — E1165 Type 2 diabetes mellitus with hyperglycemia: Principal | ICD-10-CM

## 2016-11-12 DIAGNOSIS — Z794 Long term (current) use of insulin: Principal | ICD-10-CM

## 2016-11-12 DIAGNOSIS — IMO0001 Reserved for inherently not codable concepts without codable children: Secondary | ICD-10-CM

## 2016-11-12 MED ORDER — SITAGLIPTIN PHOSPHATE 50 MG PO TABS: 50.0000 mg | ORAL_TABLET | Freq: Every day | ORAL | 1 refills | Status: DC

## 2016-11-12 MED ORDER — EMPAGLIFLOZIN 10 MG PO TABS
10.0000 mg | ORAL_TABLET | Freq: Every day | ORAL | 1 refills | Status: DC
Start: 2016-11-12 — End: 2017-01-18

## 2016-11-23 ENCOUNTER — Telehealth: Payer: Self-pay | Admitting: Family Medicine

## 2016-11-23 ENCOUNTER — Ambulatory Visit: Payer: BLUE CROSS/BLUE SHIELD | Admitting: Nurse Practitioner

## 2016-11-23 DIAGNOSIS — M5416 Radiculopathy, lumbar region: Secondary | ICD-10-CM | POA: Diagnosis not present

## 2016-11-23 NOTE — Telephone Encounter (Signed)
I have started Prior authorization for Jardiance, Januvia and Metformin ER ( Glumetza) on cover my meds will take 72 hours for decision or longer . FYI

## 2016-11-25 ENCOUNTER — Encounter: Payer: Self-pay | Admitting: Family Medicine

## 2016-12-01 DIAGNOSIS — M5416 Radiculopathy, lumbar region: Secondary | ICD-10-CM | POA: Diagnosis not present

## 2016-12-05 DIAGNOSIS — M5416 Radiculopathy, lumbar region: Secondary | ICD-10-CM | POA: Diagnosis not present

## 2017-01-06 DIAGNOSIS — M5416 Radiculopathy, lumbar region: Secondary | ICD-10-CM | POA: Diagnosis not present

## 2017-01-18 ENCOUNTER — Encounter: Payer: Self-pay | Admitting: Family Medicine

## 2017-01-18 ENCOUNTER — Ambulatory Visit (INDEPENDENT_AMBULATORY_CARE_PROVIDER_SITE_OTHER): Payer: BLUE CROSS/BLUE SHIELD | Admitting: Family Medicine

## 2017-01-18 VITALS — BP 122/70 | HR 69 | Temp 98.3°F | Wt 169.4 lb

## 2017-01-18 DIAGNOSIS — M79605 Pain in left leg: Secondary | ICD-10-CM

## 2017-01-18 DIAGNOSIS — I10 Essential (primary) hypertension: Secondary | ICD-10-CM | POA: Diagnosis not present

## 2017-01-18 DIAGNOSIS — IMO0001 Reserved for inherently not codable concepts without codable children: Secondary | ICD-10-CM

## 2017-01-18 DIAGNOSIS — E1165 Type 2 diabetes mellitus with hyperglycemia: Secondary | ICD-10-CM | POA: Diagnosis not present

## 2017-01-18 DIAGNOSIS — M545 Low back pain: Secondary | ICD-10-CM | POA: Diagnosis not present

## 2017-01-18 DIAGNOSIS — Z794 Long term (current) use of insulin: Secondary | ICD-10-CM | POA: Diagnosis not present

## 2017-01-18 MED ORDER — PROPRANOLOL HCL 40 MG PO TABS: 40.0000 mg | ORAL_TABLET | Freq: Two times a day (BID) | ORAL | 3 refills | Status: DC

## 2017-01-18 MED ORDER — GLIMEPIRIDE 4 MG PO TABS: ORAL_TABLET | ORAL | 3 refills | Status: DC

## 2017-01-18 MED ORDER — BUTALBITAL-APAP-CAFFEINE 50-325-40 MG PO TABS: ORAL_TABLET | ORAL | 0 refills | Status: DC

## 2017-01-18 NOTE — Patient Instructions (Addendum)
Nice to see you. We will have you return for fasting lab work. Please continue to monitor your blood sugars and blood pressures. If you develop numbness, weakness, loss of bowel or bladder function, numbness between your legs, or any new or changing symptoms please seek medical attention medially.

## 2017-01-18 NOTE — Assessment & Plan Note (Signed)
Improved control. No longer on Januvia or Jardiance due to cost concerns. We'll have him return for an A1c and fasting lipid panel. If A1c is uncontrolled we'll try to find a way to get him these medications or come up with an alternative medication.

## 2017-01-18 NOTE — Assessment & Plan Note (Signed)
Well-controlled. Continue current medications. Continue to monitor at home.

## 2017-01-18 NOTE — Progress Notes (Signed)
[name], MD Phone: [phone]  Jonathan West is a 56 y.o. male who presents today for f/u.  DIABETES Disease Monitoring: Blood Sugar ranges-typically less than 160, few over 300 Polyuria/phagia/dipsia- no      Medications: Compliance- taking metformin, NPH 19 units, and glyburide, stopped Januvia and Jardiance due to cost Hypoglycemic symptoms- rare, resolves quickly, just reports he feels off  HYPERTENSION  Disease Monitoring  Home BP Monitoring 140s/80s Chest pain- no    Dyspnea- no Medications  Compliance-  taking lisinopril, propranolol, amlodipine  Edema- no  Patient reports he has been seeing orthopedics for a pinched nerve in his back. Notes tingling sensation in his anterior upper thigh and knee. Had an MRI that he reports revealed spinal stenosis at L3 and L5. Has been doing physical therapy. Notes no weakness. No saddle anesthesia or bowel or bladder incontinence. Notes occasionally his left great toe will appear somewhat red compared to the right. He wonders if this could be related to the potential nerve. He is going to be having surgery on his back sometime soon. Has been taking Vicodin for this with little benefit.   PMH: nonsmoker.   ROS see history of present illness  Objective  Physical Exam Vitals:   01/18/17 0915  BP: 122/70  Pulse: 69  Temp: 98.3 F (36.8 C)    BP Readings from Last 3 Encounters:  01/18/17 122/70  10/12/16 120/78  08/24/16 133/90   Wt Readings from Last 3 Encounters:  01/18/17 169 lb 6.4 oz (76.8 kg)  10/12/16 182 lb 3.2 oz (82.6 kg)  08/24/16 186 lb (84.4 kg)    Physical Exam  Constitutional: No distress.  Cardiovascular: Normal rate, regular rhythm and normal heart sounds.   Pulmonary/Chest: Effort normal and breath sounds normal.  Musculoskeletal:  No midline spine tenderness, no midline spine step-off, no muscular back tenderness  Neurological: He is alert. Gait normal.  5 out of 5 strength bilateral quads,  hamstrings, plantar flexion, and dorsiflexion, sensation to light touch intact in bilateral lower extremities  Skin: Skin is warm and dry. He is not diaphoretic.   Diabetic Foot Exam - Simple   Simple Foot Form Diabetic Foot exam was performed with the following findings:  Yes 01/18/2017  9:39 AM  Visual Inspection No deformities, no ulcerations, no other skin breakdown bilaterally:  Yes Sensation Testing Intact to touch and monofilament testing bilaterally:  Yes Pulse Check Posterior Tibialis and Dorsalis pulse intact bilaterally:  Yes Comments     Assessment/Plan: Please see individual problem list.  Essential hypertension Well-controlled. Continue current medications. Continue to monitor at home.  DM (diabetes mellitus), type 2, uncontrolled Improved control. No longer on Januvia or Jardiance due to cost concerns. We'll have him return for an A1c and fasting lipid panel. If A1c is uncontrolled we'll try to find a way to get him these medications or come up with an alternative medication.  Low back pain radiating to left leg Chronic issue. Has worsened. Underwent MRI through orthopedics that he reports showed spinal stenosis. Neurologically intact in his lower extremities today. Discussed following through with orthopedics to determine when surgery would be. Discussed that his left toe being red could be related to the pinched nerve. He has good blood flow in his feet and good pulses. He'll continue to monitor that and his back. Return precautions in AVS.   Orders Placed This Encounter  Procedures  . Lipid panel    Standing Status:   Future    Standing Expiration  Date:   01/18/2018  . Comp Met (CMET)    Standing Status:   Future    Standing Expiration Date:   01/18/2018  . HgB A1c    Standing Status:   Future    Standing Expiration Date:   01/18/2018    [name], MD [place]

## 2017-01-18 NOTE — Assessment & Plan Note (Signed)
Chronic issue. Has worsened. Underwent MRI through orthopedics that he reports showed spinal stenosis. Neurologically intact in his lower extremities today. Discussed following through with orthopedics to determine when surgery would be. Discussed that his left toe being red could be related to the pinched nerve. He has good blood flow in his feet and good pulses. He'll continue to monitor that and his back. Return precautions in AVS.

## 2017-01-22 ENCOUNTER — Other Ambulatory Visit (INDEPENDENT_AMBULATORY_CARE_PROVIDER_SITE_OTHER): Payer: BLUE CROSS/BLUE SHIELD

## 2017-01-22 DIAGNOSIS — Z794 Long term (current) use of insulin: Secondary | ICD-10-CM | POA: Diagnosis not present

## 2017-01-22 DIAGNOSIS — I10 Essential (primary) hypertension: Secondary | ICD-10-CM | POA: Diagnosis not present

## 2017-01-22 DIAGNOSIS — E1165 Type 2 diabetes mellitus with hyperglycemia: Secondary | ICD-10-CM | POA: Diagnosis not present

## 2017-01-22 DIAGNOSIS — IMO0001 Reserved for inherently not codable concepts without codable children: Secondary | ICD-10-CM

## 2017-01-22 LAB — COMPREHENSIVE METABOLIC PANEL
ALT: 17 U/L (ref 0–53)
AST: 14 U/L (ref 0–37)
Albumin: 4.1 g/dL (ref 3.5–5.2)
Alkaline Phosphatase: 73 U/L (ref 39–117)
BUN: 15 mg/dL (ref 6–23)
CALCIUM: 9.4 mg/dL (ref 8.4–10.5)
CHLORIDE: 105 meq/L (ref 96–112)
CO2: 30 meq/L (ref 19–32)
CREATININE: 1 mg/dL (ref 0.40–1.50)
GFR: 81.57 mL/min (ref >60.00)
GLUCOSE: 164 mg/dL — AB (ref 70–99)
Potassium: 4 mEq/L (ref 3.5–5.1)
Sodium: 140 mEq/L (ref 135–145)
Total Bilirubin: 0.5 mg/dL (ref 0.2–1.2)
Total Protein: 6.6 g/dL (ref 6.0–8.3)

## 2017-01-22 LAB — HEMOGLOBIN A1C: Hgb A1c MFr Bld: 7.9 % — ABNORMAL HIGH (ref 4.6–6.5)

## 2017-01-22 LAB — LIPID PANEL
CHOLESTEROL: 121 mg/dL (ref 0–200)
HDL: 26.5 mg/dL — AB (ref >39.00)
LDL Cholesterol: 65 mg/dL (ref 0–99)
NONHDL: 94.21
Total CHOL/HDL Ratio: 5
Triglycerides: 147 mg/dL (ref 0.0–149.0)
VLDL: 29.4 mg/dL (ref 0.0–40.0)

## 2017-02-02 ENCOUNTER — Telehealth: Payer: Self-pay | Admitting: Family Medicine

## 2017-02-02 DIAGNOSIS — E1165 Type 2 diabetes mellitus with hyperglycemia: Principal | ICD-10-CM

## 2017-02-02 DIAGNOSIS — IMO0001 Reserved for inherently not codable concepts without codable children: Secondary | ICD-10-CM

## 2017-02-02 DIAGNOSIS — Z794 Long term (current) use of insulin: Principal | ICD-10-CM

## 2017-02-02 MED ORDER — INSULIN ISOPHANE HUMAN 100 UNIT/ML KWIKPEN: 21.0000 [IU] | PEN_INJECTOR | Freq: Every day | SUBCUTANEOUS | 3 refills | Status: DC

## 2017-02-02 NOTE — Telephone Encounter (Signed)
He should increase his NPH to 21 units daily. He should be monitoring his fasting sugars and he can contact us in 3 days if his sugars do not improve to less than 150 consistently fasting in the morning for further instructions. Thanks.

## 2017-02-02 NOTE — Telephone Encounter (Signed)
Pt called and was looking to find out what the dosage increase will be. Pt is temporarily scheduled for Lumbar laminectomy in 2 weeks. He states that he would like the fastest acting choice to get his sugar levels down.  Call pt @ [phone]

## 2017-02-02 NOTE — Telephone Encounter (Signed)
Per your results note, patient was okay with increasing the dosing, Please advise on dose or what your plan is due to his upcoming surgery. thanks

## 2017-02-02 NOTE — Telephone Encounter (Signed)
Spoke with the patient, he verbalized new order changes and he will regardless send a my chart with how he is doing in the next few days. thanks

## 2017-02-02 NOTE — Telephone Encounter (Signed)
See below

## 2017-02-08 ENCOUNTER — Other Ambulatory Visit: Payer: Self-pay | Admitting: Family Medicine

## 2017-02-12 ENCOUNTER — Other Ambulatory Visit: Payer: Self-pay | Admitting: Family Medicine

## 2017-03-09 ENCOUNTER — Encounter: Payer: Self-pay | Admitting: Family Medicine

## 2017-03-09 DIAGNOSIS — Z794 Long term (current) use of insulin: Principal | ICD-10-CM

## 2017-03-09 DIAGNOSIS — E1165 Type 2 diabetes mellitus with hyperglycemia: Principal | ICD-10-CM

## 2017-03-09 DIAGNOSIS — IMO0001 Reserved for inherently not codable concepts without codable children: Secondary | ICD-10-CM

## 2017-03-09 MED ORDER — INSULIN PEN NEEDLE 32G X 4 MM MISC: 1 refills | Status: DC

## 2017-03-09 MED ORDER — INSULIN ISOPHANE HUMAN 100 UNIT/ML KWIKPEN: 21.0000 [IU] | PEN_INJECTOR | Freq: Every day | SUBCUTANEOUS | 3 refills | Status: DC

## 2017-03-09 MED ORDER — GLUCOSE BLOOD VI STRP: ORAL_STRIP | 2 refills | Status: DC

## 2017-03-30 ENCOUNTER — Other Ambulatory Visit: Payer: Self-pay | Admitting: Family Medicine

## 2017-03-30 NOTE — Telephone Encounter (Signed)
I will forward to our clinical pharmacist as the patient previously saw Lowella Grip to see what alternatives there are for the patient will be affordable.

## 2017-03-30 NOTE — Telephone Encounter (Signed)
Pt called and wanted to discuss cost of a few of his medications. He has stopped taking Jardiance and Januvia. Pt states that he is just about out of his Insulin NPH, Human,, Isophane, (HUMULIN N KWIKPEN) 100 UNIT/ML Kiwkpen but the cost is skyrocketing and he can't afford it. Please advise, thank you!  Call pt @ [phone]

## 2017-03-30 NOTE — Telephone Encounter (Signed)
Please advise 

## 2017-03-31 NOTE — Telephone Encounter (Signed)
[place] to try and obtain insurance information. Last billed to "PRS" Plan on 02/15/2017 ID: [id number] Group: [id number] Plan: [id number] BIN: [id number]  Given the above, It appears that the patient has [employer] insurance. There are no Tier 1 options for insulin. The following basal insulins are Tier 2:  - Humulin N KwikPen and Vial - Lantus SoloStar - Lantus vial  Other basal insulins are all excluded.   Other options include using [facility] and buying NPH in vials at cash price of $25/vial. It appears that the patient is prescribed Humulin N 21 units once daily. One vial would last 31 days after opening before expiring. He would also need an Rx for syringes and needles if switching to vial option. I will communicate this to patient if he calls me back.    Carlean Jews, Pharm.D. PGY2 Ambulatory Care Pharmacy Resident Phone: [phone]

## 2017-04-01 NOTE — Telephone Encounter (Addendum)
Patient called me back. He has not met his deductible yet for the year which is why his medications are so expensive. Explained options to patient. Patient expresses hesitation about using syringe + needle but wants to learn how to do it to see if he can get over his fear. Scheduled patient for appointment with me on Monday for insulin syringe injection technique teaching.  Told pt to ask for Insulin NPH at [facility] if he were to purchase this over the counter. Patient verbalized understanding.   Carlean Jews, Pharm.D. PGY2 Ambulatory Care Pharmacy Resident Phone: [phone]

## 2017-04-01 NOTE — Telephone Encounter (Signed)
Noted. Thank you for helping with this.  

## 2017-04-05 ENCOUNTER — Ambulatory Visit (INDEPENDENT_AMBULATORY_CARE_PROVIDER_SITE_OTHER): Payer: BLUE CROSS/BLUE SHIELD | Admitting: Pharmacist

## 2017-04-05 DIAGNOSIS — Z794 Long term (current) use of insulin: Secondary | ICD-10-CM

## 2017-04-05 DIAGNOSIS — IMO0001 Reserved for inherently not codable concepts without codable children: Secondary | ICD-10-CM

## 2017-04-05 DIAGNOSIS — E1165 Type 2 diabetes mellitus with hyperglycemia: Secondary | ICD-10-CM

## 2017-04-05 NOTE — Progress Notes (Signed)
I have reviewed the above note and agree.  Pailyn Bellevue, M.D.  

## 2017-04-05 NOTE — Progress Notes (Signed)
Patient presents to clinic visit with pharmacist at request of Dr. Caryl Bis (see note 03/30/17) for insulin injection teaching.   Patient has not met deductible so insulin pens are no longer affordable. Of note, patient is also not taking Januvia or Jardiance 2/2 cost.   No issues with hypoglycemia at this time, demonstrates understanding of proper hypoglycemia treatment.   Teaching completed for insulin injections with syringes. Patient demonstrated proper technique and confidence with injections. Advised patient that vials of NPH at [facility] will be $25/vial and they expire 42 days after opening.   Patient denies further questions. Contact information provided in case he has any issues.   Carlean Jews, Pharm.D. PGY2 Ambulatory Care Pharmacy Resident Phone: [phone]

## 2017-04-05 NOTE — Patient Instructions (Signed)
Thanks for coming to see me today!   You can buy insulin NPH at [facility] for $25/vial. The vials expire 42 days after opening or after keeping at room temperature.   Call me with any issues.

## 2017-04-09 DIAGNOSIS — M5416 Radiculopathy, lumbar region: Secondary | ICD-10-CM | POA: Diagnosis not present

## 2017-04-09 DIAGNOSIS — M545 Low back pain: Secondary | ICD-10-CM | POA: Diagnosis not present

## 2017-04-22 ENCOUNTER — Ambulatory Visit: Payer: BLUE CROSS/BLUE SHIELD | Admitting: Family Medicine

## 2017-04-27 ENCOUNTER — Encounter: Payer: Self-pay | Admitting: Family Medicine

## 2017-04-27 ENCOUNTER — Ambulatory Visit (INDEPENDENT_AMBULATORY_CARE_PROVIDER_SITE_OTHER): Payer: BLUE CROSS/BLUE SHIELD | Admitting: Family Medicine

## 2017-04-27 VITALS — BP 110/70 | HR 70 | Temp 98.1°F | Wt 181.4 lb

## 2017-04-27 DIAGNOSIS — IMO0001 Reserved for inherently not codable concepts without codable children: Secondary | ICD-10-CM

## 2017-04-27 DIAGNOSIS — I10 Essential (primary) hypertension: Secondary | ICD-10-CM

## 2017-04-27 DIAGNOSIS — Z794 Long term (current) use of insulin: Secondary | ICD-10-CM | POA: Diagnosis not present

## 2017-04-27 DIAGNOSIS — E1165 Type 2 diabetes mellitus with hyperglycemia: Secondary | ICD-10-CM

## 2017-04-27 LAB — POCT GLYCOSYLATED HEMOGLOBIN (HGB A1C): HEMOGLOBIN A1C: 7.5

## 2017-04-27 MED ORDER — METFORMIN HCL ER (MOD) 1000 MG PO TB24: ORAL_TABLET | ORAL | 2 refills | Status: DC

## 2017-04-27 MED ORDER — INSULIN ISOPHANE HUMAN 100 UNIT/ML KWIKPEN: 22.0000 [IU] | PEN_INJECTOR | Freq: Every day | SUBCUTANEOUS | 3 refills | Status: DC

## 2017-04-27 NOTE — Progress Notes (Signed)
  [name], MD Phone: [phone]  Jonathan West is a 56 y.o. male who presents today for follow-up.  Hypertension: Running between 716-967 systolically. Taking amlodipine and lisinopril and propranolol. No chest pain or shortness of breath or edema.  Diabetes: Fasting a.m. sugars ranging from 108-258. He continues to take insulin. Currently taking NPH 21 units nightly. Taking glimepiride and metformin. No polyuria. Rare hypoglycemia. Typically occurs once a month.  ROS see history of present illness  Objective  Physical Exam Vitals:   04/27/17 1409  BP: 110/70  Pulse: 70  Temp: 98.1 F (36.7 C)  SpO2: 98%    BP Readings from Last 3 Encounters:  04/27/17 110/70  01/18/17 122/70  10/12/16 120/78   Wt Readings from Last 3 Encounters:  04/27/17 181 lb 6.4 oz (82.3 kg)  01/18/17 169 lb 6.4 oz (76.8 kg)  10/12/16 182 lb 3.2 oz (82.6 kg)    Physical Exam  Constitutional: No distress.  Cardiovascular: Normal rate, regular rhythm and normal heart sounds.   Pulmonary/Chest: Effort normal and breath sounds normal.  Musculoskeletal: He exhibits no edema.  Neurological: He is alert. Gait normal.  Skin: Skin is warm and dry. He is not diaphoretic.     Assessment/Plan: Please see individual problem list.  Essential hypertension At goal today. Slightly elevated at home. Discussed adding an additional medication though he was hesitant. Work on dietary changes. He'll increase activity as advised by his therapist and surgeon. We'll see what his blood pressure is at follow-up. Continue current medications.  DM (diabetes mellitus), type 2, uncontrolled A1c is somewhat improved. Not at goal though. We'll increase his NPH to 22 units daily. He'll contact us in 1 week with his blood sugars. Continue other medications.   Orders Placed This Encounter  Procedures  . POCT HgB A1C    Meds ordered this encounter  Medications  . metFORMIN (GLUMETZA) 1000 MG (MOD) 24 hr tablet      Sig: TAKE 2 TABLETS BY MOUTH  DAILY WITH BREAKFAST    Dispense:  180 tablet    Refill:  2  . Insulin NPH, Human,, Isophane, (HUMULIN N KWIKPEN) 100 UNIT/ML Kiwkpen    Sig: Inject 22 Units into the skin at bedtime.    Dispense:  15 mL    Refill:  3    Quantity sufficient for 90 day supply    [name], MD [place]

## 2017-04-27 NOTE — Assessment & Plan Note (Signed)
A1c is somewhat improved. Not at goal though. We'll increase his NPH to 22 units daily. He'll contact us in 1 week with his blood sugars. Continue other medications.

## 2017-04-27 NOTE — Assessment & Plan Note (Signed)
At goal today. Slightly elevated at home. Discussed adding an additional medication though he was hesitant. Work on dietary changes. He'll increase activity as advised by his therapist and surgeon. We'll see what his blood pressure is at follow-up. Continue current medications.

## 2017-04-27 NOTE — Patient Instructions (Signed)
Nice to see you. Please try to work on diet and return to being active as you are advised by your therapist and surgeon. We will check an A1c and contact you with the results. Please continue your current diabetes medication regimen.

## 2017-04-29 DIAGNOSIS — M5416 Radiculopathy, lumbar region: Secondary | ICD-10-CM | POA: Diagnosis not present

## 2017-04-29 DIAGNOSIS — M545 Low back pain: Secondary | ICD-10-CM | POA: Diagnosis not present

## 2017-05-11 DIAGNOSIS — M6281 Muscle weakness (generalized): Secondary | ICD-10-CM | POA: Diagnosis not present

## 2017-05-13 ENCOUNTER — Encounter: Payer: Self-pay | Admitting: Family Medicine

## 2017-05-14 MED ORDER — METFORMIN HCL ER (MOD) 1000 MG PO TB24: ORAL_TABLET | ORAL | 2 refills | Status: DC

## 2017-05-14 MED ORDER — METFORMIN HCL ER (MOD) 1000 MG PO TB24: ORAL_TABLET | ORAL | 0 refills | Status: DC

## 2017-05-18 ENCOUNTER — Other Ambulatory Visit: Payer: Self-pay | Admitting: Family Medicine

## 2017-05-18 NOTE — Telephone Encounter (Signed)
Patient states he takes this for headaches as needed

## 2017-05-18 NOTE — Telephone Encounter (Signed)
Please find out what the patient takes this for. Thanks.

## 2017-05-18 NOTE — Telephone Encounter (Signed)
Last OV 04/27/17 last filled  01/18/17 30 0rf

## 2017-05-19 NOTE — Telephone Encounter (Signed)
faxed

## 2017-05-19 NOTE — Telephone Encounter (Signed)
Please fax to pharmacy

## 2017-06-01 ENCOUNTER — Other Ambulatory Visit: Payer: Self-pay | Admitting: Family Medicine

## 2017-06-01 DIAGNOSIS — I10 Essential (primary) hypertension: Secondary | ICD-10-CM

## 2017-06-08 ENCOUNTER — Encounter: Payer: Self-pay | Admitting: Family Medicine

## 2017-06-09 MED ORDER — METFORMIN HCL ER (MOD) 1000 MG PO TB24: ORAL_TABLET | ORAL | 1 refills | Status: DC

## 2017-06-15 MED ORDER — METFORMIN HCL ER 500 MG PO TB24: 2000.0000 mg | ORAL_TABLET | Freq: Every day | ORAL | 3 refills | Status: DC

## 2017-06-15 NOTE — Addendum Note (Signed)
Addended by: Juanda Chance on: 06/15/2017 11:02 AM   Modules accepted: Orders

## 2017-06-15 NOTE — Telephone Encounter (Signed)
Sent new rx for metformin 500mg  ER 2000 mg daily to [facility], patient came into the office and states 100 mg ER was too expensive.

## 2017-08-11 ENCOUNTER — Ambulatory Visit (INDEPENDENT_AMBULATORY_CARE_PROVIDER_SITE_OTHER): Payer: BLUE CROSS/BLUE SHIELD | Admitting: Family Medicine

## 2017-08-11 ENCOUNTER — Encounter: Payer: Self-pay | Admitting: Family Medicine

## 2017-08-11 ENCOUNTER — Other Ambulatory Visit: Payer: Self-pay

## 2017-08-11 VITALS — BP 120/72 | HR 70 | Temp 97.9°F | Wt 181.8 lb

## 2017-08-11 DIAGNOSIS — E1165 Type 2 diabetes mellitus with hyperglycemia: Secondary | ICD-10-CM | POA: Diagnosis not present

## 2017-08-11 DIAGNOSIS — E559 Vitamin D deficiency, unspecified: Secondary | ICD-10-CM | POA: Diagnosis not present

## 2017-08-11 DIAGNOSIS — E538 Deficiency of other specified B group vitamins: Secondary | ICD-10-CM | POA: Diagnosis not present

## 2017-08-11 DIAGNOSIS — Z862 Personal history of diseases of the blood and blood-forming organs and certain disorders involving the immune mechanism: Secondary | ICD-10-CM | POA: Insufficient documentation

## 2017-08-11 DIAGNOSIS — I10 Essential (primary) hypertension: Secondary | ICD-10-CM

## 2017-08-11 HISTORY — DX: Personal history of diseases of the blood and blood-forming organs and certain disorders involving the immune mechanism: Z86.2

## 2017-08-11 LAB — COMPREHENSIVE METABOLIC PANEL
ALT: 16 U/L (ref 0–53)
AST: 16 U/L (ref 0–37)
Albumin: 4.4 g/dL (ref 3.5–5.2)
Alkaline Phosphatase: 88 U/L (ref 39–117)
BUN: 13 mg/dL (ref 6–23)
CALCIUM: 9.1 mg/dL (ref 8.4–10.5)
CHLORIDE: 107 meq/L (ref 96–112)
CO2: 29 meq/L (ref 19–32)
Creatinine, Ser: 1.06 mg/dL (ref 0.40–1.50)
GFR: 76.12 mL/min (ref >60.00)
Glucose, Bld: 153 mg/dL — ABNORMAL HIGH (ref 70–99)
Potassium: 4.6 mEq/L (ref 3.5–5.1)
Sodium: 141 mEq/L (ref 135–145)
Total Bilirubin: 0.4 mg/dL (ref 0.2–1.2)
Total Protein: 7.1 g/dL (ref 6.0–8.3)

## 2017-08-11 LAB — CBC
HEMATOCRIT: 43.9 % (ref 39.0–52.0)
HEMOGLOBIN: 14.6 g/dL (ref 13.0–17.0)
MCHC: 33.4 g/dL (ref 30.0–36.0)
MCV: 87 fl (ref 78.0–100.0)
Platelets: 245 10*3/uL (ref 150.0–400.0)
RBC: 5.04 Mil/uL (ref 4.22–5.81)
RDW: 13.9 % (ref 11.5–15.5)
WBC: 7.7 10*3/uL (ref 4.0–10.5)

## 2017-08-11 LAB — VITAMIN B12: VITAMIN B 12: 275 pg/mL (ref 211–911)

## 2017-08-11 LAB — VITAMIN D 25 HYDROXY (VIT D DEFICIENCY, FRACTURES): VITD: 35.17 ng/mL (ref 30.00–100.00)

## 2017-08-11 LAB — HEMOGLOBIN A1C: HEMOGLOBIN A1C: 7.5 % — AB (ref 4.6–6.5)

## 2017-08-11 MED ORDER — ATORVASTATIN CALCIUM 10 MG PO TABS: 10.0000 mg | ORAL_TABLET | Freq: Every day | ORAL | 3 refills | Status: DC

## 2017-08-11 NOTE — Patient Instructions (Signed)
Nice to see you. We will check lab work today. We will request records. Please try omron for a BP cuff.

## 2017-08-11 NOTE — Assessment & Plan Note (Addendum)
Improved.  Check A1c.  LDL cholesterol has been well controlled though patient does have diabetes.  Has a strong family history of cardiac disease.  Discussed starting low-dose Lipitor.  Return in 1 month for recheck of labs.

## 2017-08-11 NOTE — Assessment & Plan Note (Signed)
Check CBC 

## 2017-08-11 NOTE — Assessment & Plan Note (Signed)
Well-controlled.  Continue current regimen. 

## 2017-08-11 NOTE — Assessment & Plan Note (Addendum)
Check B12 

## 2017-08-11 NOTE — Assessment & Plan Note (Signed)
Check vitamin D. 

## 2017-08-11 NOTE — Progress Notes (Signed)
  [name], MD Phone: [phone]  Jonathan West is a 56 y.o. male who presents today for follow-up.  Diabetes: Ranging between 90-200.  Typically between 135 and 155.  Taking glimepiride, metformin, NPH 23 units.  No polyuria or polydipsia.  No hypoglycemia.  Hypertension: Notes his cuff is not accurate.  Taking amlodipine, lisinopril, metoprolol.  No chest pain, shortness of breath, or edema.  Patient has a history of B12 deficiency.  Was previously given injections though never started on oral supplementation.  History of vitamin D deficiency.  Is on vitamin D supplementation.  Has a history of iron deficiency and anemia with endoscopy and colonoscopy formed for this.  Had surgery for the hemorrhoids.  Has not had any significant issues since then.  Patient is unsure why he is on gabapentin.  Possibly related to nerve impingement.  Social History   Tobacco Use  Smoking Status Never Smoker  Smokeless Tobacco Never Used     ROS see history of present illness  Objective  Physical Exam Vitals:   08/11/17 1405  BP: 120/72  Pulse: 70  Temp: 97.9 F (36.6 C)  SpO2: 99%    BP Readings from Last 3 Encounters:  08/11/17 120/72  04/27/17 110/70  01/18/17 122/70   Wt Readings from Last 3 Encounters:  08/11/17 181 lb 12.8 oz (82.5 kg)  04/27/17 181 lb 6.4 oz (82.3 kg)  01/18/17 169 lb 6.4 oz (76.8 kg)    Physical Exam  Constitutional: No distress.  Cardiovascular: Normal rate, regular rhythm and normal heart sounds.  Pulmonary/Chest: Effort normal and breath sounds normal.  Musculoskeletal: He exhibits no edema.  Neurological: He is alert. Gait normal.  Skin: Skin is warm and dry. He is not diaphoretic.     Assessment/Plan: Please see individual problem list.  Essential hypertension Well-controlled.  Continue current regimen.  DM (diabetes mellitus), type 2, uncontrolled Improved.  Check A1c.  LDL cholesterol has been well controlled though patient does  have diabetes.  Has a strong family history of cardiac disease.  Discussed starting low-dose Lipitor.  Return in 1 month for recheck of labs.  B12 deficiency Check B12.  Vitamin D deficiency Check vitamin D  History of anemia Check CBC. Continue gabapentin for now.  [place] was seen today for follow-up.  Diagnoses and all orders for this visit:  Essential hypertension -     Comp Met (CMET)  Uncontrolled type 2 diabetes mellitus with hyperglycemia (HCC) -     HgB A1c  B12 deficiency -     B12  Vitamin D deficiency -     Vitamin D (25 hydroxy)  History of anemia -     CBC  Other orders -     atorvastatin (LIPITOR) 10 MG tablet; Take 1 tablet (10 mg total) by mouth daily.    Orders Placed This Encounter  Procedures  . CBC  . Comp Met (CMET)  . HgB A1c  . B12  . Vitamin D (25 hydroxy)    Meds ordered this encounter  Medications  . atorvastatin (LIPITOR) 10 MG tablet    Sig: Take 1 tablet (10 mg total) by mouth daily.    Dispense:  30 tablet    Refill:  [place], MD [place]

## 2017-08-12 ENCOUNTER — Telehealth: Payer: Self-pay

## 2017-08-12 DIAGNOSIS — I10 Essential (primary) hypertension: Secondary | ICD-10-CM

## 2017-08-12 MED ORDER — ATORVASTATIN CALCIUM 10 MG PO TABS: 10.0000 mg | ORAL_TABLET | Freq: Every day | ORAL | 3 refills | Status: DC

## 2017-08-12 MED ORDER — AMLODIPINE BESYLATE 10 MG PO TABS: 10.0000 mg | ORAL_TABLET | Freq: Every day | ORAL | 1 refills | Status: DC

## 2017-08-12 NOTE — Telephone Encounter (Signed)
Sent refill

## 2017-08-14 LAB — HM DIABETES EYE EXAM

## 2017-09-10 ENCOUNTER — Telehealth: Payer: Self-pay | Admitting: Radiology

## 2017-09-10 DIAGNOSIS — E785 Hyperlipidemia, unspecified: Secondary | ICD-10-CM

## 2017-09-10 NOTE — Telephone Encounter (Signed)
Pt coming in for labs Monday, please place future order. Thank you

## 2017-09-10 NOTE — Telephone Encounter (Signed)
I am unsure why he is coming in for labs. Can you check with him to see if we contacted him to come in for labs? Thanks.

## 2017-09-13 ENCOUNTER — Other Ambulatory Visit (INDEPENDENT_AMBULATORY_CARE_PROVIDER_SITE_OTHER): Payer: BLUE CROSS/BLUE SHIELD

## 2017-09-13 ENCOUNTER — Encounter: Payer: Self-pay | Admitting: Family Medicine

## 2017-09-13 ENCOUNTER — Other Ambulatory Visit: Payer: BLUE CROSS/BLUE SHIELD

## 2017-09-13 DIAGNOSIS — E785 Hyperlipidemia, unspecified: Secondary | ICD-10-CM

## 2017-09-13 DIAGNOSIS — E11649 Type 2 diabetes mellitus with hypoglycemia without coma: Secondary | ICD-10-CM | POA: Diagnosis not present

## 2017-09-13 LAB — HEPATIC FUNCTION PANEL
ALT: 16 U/L (ref 0–53)
AST: 15 U/L (ref 0–37)
Albumin: 4.1 g/dL (ref 3.5–5.2)
Alkaline Phosphatase: 89 U/L (ref 39–117)
BILIRUBIN DIRECT: 0.1 mg/dL (ref 0.0–0.3)
BILIRUBIN TOTAL: 0.6 mg/dL (ref 0.2–1.2)
Total Protein: 6.8 g/dL (ref 6.0–8.3)

## 2017-09-13 LAB — LDL CHOLESTEROL, DIRECT: Direct LDL: 58 mg/dL

## 2017-09-13 NOTE — Telephone Encounter (Signed)
Patient started Lipitor at [facility] 08/11/17

## 2017-09-13 NOTE — Addendum Note (Signed)
Addended by: Arby Barrette on: 09/13/2017 12:32 PM   Modules accepted: Orders

## 2017-09-13 NOTE — Telephone Encounter (Signed)
Pt stated that he started Lipitor about a month ago & was suppose to have labs to make sure the medication was not affecting his liver. I drew a BMP, and pt wasn't very happy that we didn't know why he needed labs today. Pt stated it should be in his chart already as to why he needed to come in.

## 2017-09-13 NOTE — Addendum Note (Signed)
Addended by: Caryl Bis, Vauda Salvucci G on: 09/13/2017 12:30 PM   Modules accepted: Orders

## 2017-09-13 NOTE — Telephone Encounter (Signed)
Orders placed.

## 2017-09-17 ENCOUNTER — Encounter: Payer: Self-pay | Admitting: Family Medicine

## 2017-09-29 ENCOUNTER — Encounter: Payer: Self-pay | Admitting: Family Medicine

## 2017-10-16 ENCOUNTER — Other Ambulatory Visit: Payer: Self-pay | Admitting: Family Medicine

## 2017-10-20 ENCOUNTER — Other Ambulatory Visit: Payer: Self-pay

## 2017-10-20 MED ORDER — LISINOPRIL 40 MG PO TABS: 40.0000 mg | ORAL_TABLET | Freq: Every day | ORAL | 2 refills | Status: DC

## 2017-10-20 NOTE — Telephone Encounter (Signed)
Last OV 08/11/17 last filled 10/19/16 270 3rf

## 2017-10-29 ENCOUNTER — Encounter: Payer: Self-pay | Admitting: Family Medicine

## 2017-10-31 NOTE — Telephone Encounter (Signed)
Can you call this in to the pharmacy for the patient? You might need Chrys Racer to help with this. Diagnosis is E11.65. Thanks.

## 2017-11-01 ENCOUNTER — Encounter: Payer: Self-pay | Admitting: Family Medicine

## 2017-11-09 ENCOUNTER — Other Ambulatory Visit: Payer: Self-pay

## 2017-11-09 ENCOUNTER — Encounter: Payer: Self-pay | Admitting: Family Medicine

## 2017-11-09 ENCOUNTER — Ambulatory Visit (INDEPENDENT_AMBULATORY_CARE_PROVIDER_SITE_OTHER): Payer: BLUE CROSS/BLUE SHIELD | Admitting: Family Medicine

## 2017-11-09 VITALS — BP 112/78 | HR 70 | Temp 98.3°F | Wt 183.2 lb

## 2017-11-09 DIAGNOSIS — I10 Essential (primary) hypertension: Secondary | ICD-10-CM

## 2017-11-09 DIAGNOSIS — E785 Hyperlipidemia, unspecified: Secondary | ICD-10-CM

## 2017-11-09 DIAGNOSIS — E1165 Type 2 diabetes mellitus with hyperglycemia: Secondary | ICD-10-CM | POA: Diagnosis not present

## 2017-11-09 DIAGNOSIS — R200 Anesthesia of skin: Secondary | ICD-10-CM | POA: Diagnosis not present

## 2017-11-09 LAB — POCT GLYCOSYLATED HEMOGLOBIN (HGB A1C): HEMOGLOBIN A1C: 8.1

## 2017-11-09 MED ORDER — GLUCOSE BLOOD VI STRP: ORAL_STRIP | 3 refills | Status: DC

## 2017-11-09 NOTE — Patient Instructions (Signed)
Nice to see you. We will contact you with the results of your A1c. Please continue to monitor blood pressures and blood sugars. Please try to sleep with a pillow between your elbows to see if that helps with your hand falling asleep.  If you develop new symptoms regarding this please let us know or get evaluated.

## 2017-11-11 DIAGNOSIS — E1169 Type 2 diabetes mellitus with other specified complication: Secondary | ICD-10-CM | POA: Insufficient documentation

## 2017-11-11 DIAGNOSIS — E782 Mixed hyperlipidemia: Secondary | ICD-10-CM | POA: Insufficient documentation

## 2017-11-11 DIAGNOSIS — R200 Anesthesia of skin: Secondary | ICD-10-CM | POA: Insufficient documentation

## 2017-11-11 DIAGNOSIS — E785 Hyperlipidemia, unspecified: Secondary | ICD-10-CM | POA: Insufficient documentation

## 2017-11-11 NOTE — Progress Notes (Signed)
  [name], MD Phone: [phone]  Jonathan West is a 57 y.o. male who presents today for f/u.  HYPERTENSION  Disease Monitoring  Home BP Monitoring 110-140/70s-80  Chest pain- no    Dyspnea- no Medications  Compliance-  Taking lisinopril, amlodipine, propranolol.   Edema- no  HYPERLIPIDEMIA Symptoms Chest pain on exertion:  no   Medications: Compliance- taking lipitor and aspirin  Right upper quadrant pain- no  Muscle aches- no  DIABETES Disease Monitoring: Blood Sugar ranges-70-180 Polyuria/phagia/dipsia- no      Optho-UTD Medications: Compliance- taking glimeperide, NPH 25 u, metformin Hypoglycemic symptoms- rare, no symptoms  Reports his right hand feels like it falls asleep while he is sleeping at night.  This is been going on for a couple of months.  It will wake him up at times.  He will get up and change positions and it will resolve.  Is never happened during the day.  No other neurological symptoms.     Social History   Tobacco Use  Smoking Status Never Smoker  Smokeless Tobacco Never Used     ROS see history of present illness  Objective  Physical Exam Vitals:   11/09/17 1610  BP: 112/78  Pulse: 70  Temp: 98.3 F (36.8 C)  SpO2: 98%    BP Readings from Last 3 Encounters:  11/09/17 112/78  08/11/17 120/72  04/27/17 110/70   Wt Readings from Last 3 Encounters:  11/09/17 183 lb 3.2 oz (83.1 kg)  08/11/17 181 lb 12.8 oz (82.5 kg)  04/27/17 181 lb 6.4 oz (82.3 kg)    Physical Exam  Constitutional: No distress.  Cardiovascular: Normal rate, regular rhythm and normal heart sounds.  Pulmonary/Chest: Effort normal and breath sounds normal.  Musculoskeletal: He exhibits no edema.  Neurological: He is alert. Gait normal.  5/5 strength bilateral biceps, triceps, and grip, sensation light touch intact bilateral upper extremities  Skin: Skin is warm and dry. He is not diaphoretic.     Assessment/Plan: Please see individual problem  list.  Essential hypertension Controlled.  Continue current regimen.  DM (diabetes mellitus), type 2, uncontrolled Check A1c.  Continue current regimen for now.  Hyperlipidemia Well-controlled on statin therapy.  Positional right hand numbness Suspect related to position that he sleeps in.  Resolves with change in position.  Does not occur at any other time.  Discussed sleeping with a pillow between his elbows.  If recurs at other times he will be reevaluated.   Orders Placed This Encounter  Procedures  . POCT HgB A1C    Meds ordered this encounter  Medications  . glucose blood test strip    Sig: Check glucose three times daily, Dx code E11.65.    Dispense:  300 each    Refill:  [place], MD [place]

## 2017-11-11 NOTE — Assessment & Plan Note (Signed)
Check A1c.  Continue current regimen for now.

## 2017-11-11 NOTE — Assessment & Plan Note (Signed)
Controlled. Continue current regimen. 

## 2017-11-11 NOTE — Assessment & Plan Note (Signed)
Suspect related to position that he sleeps in.  Resolves with change in position.  Does not occur at any other time.  Discussed sleeping with a pillow between his elbows.  If recurs at other times he will be reevaluated.

## 2017-11-11 NOTE — Assessment & Plan Note (Signed)
Well-controlled on statin therapy.

## 2017-11-12 ENCOUNTER — Other Ambulatory Visit: Payer: Self-pay

## 2017-11-12 MED ORDER — GLUCOSE BLOOD VI STRP: ORAL_STRIP | 3 refills | Status: DC

## 2017-11-12 MED ORDER — ONETOUCH DELICA LANCETS 33G MISC: 1 refills | Status: DC

## 2017-11-16 ENCOUNTER — Other Ambulatory Visit: Payer: Self-pay | Admitting: Family Medicine

## 2017-11-16 MED ORDER — EMPAGLIFLOZIN 10 MG PO TABS: 10.0000 mg | ORAL_TABLET | Freq: Every day | ORAL | 3 refills | Status: DC

## 2017-12-08 ENCOUNTER — Other Ambulatory Visit: Payer: Self-pay | Admitting: Family Medicine

## 2017-12-08 DIAGNOSIS — I10 Essential (primary) hypertension: Secondary | ICD-10-CM

## 2017-12-21 ENCOUNTER — Other Ambulatory Visit: Payer: Self-pay | Admitting: Family Medicine

## 2017-12-31 ENCOUNTER — Encounter: Payer: Self-pay | Admitting: Family Medicine

## 2017-12-31 MED ORDER — ATORVASTATIN CALCIUM 10 MG PO TABS: 10.0000 mg | ORAL_TABLET | Freq: Every day | ORAL | 1 refills | Status: DC

## 2018-01-18 ENCOUNTER — Telehealth: Payer: Self-pay | Admitting: Family Medicine

## 2018-01-18 ENCOUNTER — Other Ambulatory Visit: Payer: Self-pay

## 2018-01-18 NOTE — Telephone Encounter (Signed)
Prior auth completed will fax to [facility] on [address].

## 2018-01-18 NOTE — Telephone Encounter (Signed)
Copied from [place] [id number]. Topic: Quick Communication - See Telephone Encounter >> Jan 18, 2018 12:39 PM Arletha Grippe wrote: CRM for notification. See Telephone encounter for: 01/18/18.  Medication empagliflozin (JARDIANCE) 10 MG TABS tablet needs a prior [employer] on [address]

## 2018-01-19 ENCOUNTER — Telehealth: Payer: Self-pay

## 2018-01-19 NOTE — Telephone Encounter (Signed)
Prior authorization approved for Jardiance through 01-15-2019.

## 2018-02-09 ENCOUNTER — Encounter: Payer: Self-pay | Admitting: Family Medicine

## 2018-02-09 ENCOUNTER — Ambulatory Visit (INDEPENDENT_AMBULATORY_CARE_PROVIDER_SITE_OTHER): Payer: BLUE CROSS/BLUE SHIELD | Admitting: Family Medicine

## 2018-02-09 VITALS — BP 110/70 | HR 65 | Temp 98.6°F | Ht 70.0 in | Wt 182.2 lb

## 2018-02-09 DIAGNOSIS — D229 Melanocytic nevi, unspecified: Secondary | ICD-10-CM

## 2018-02-09 DIAGNOSIS — I10 Essential (primary) hypertension: Secondary | ICD-10-CM

## 2018-02-09 DIAGNOSIS — E1165 Type 2 diabetes mellitus with hyperglycemia: Secondary | ICD-10-CM

## 2018-02-09 DIAGNOSIS — R0789 Other chest pain: Secondary | ICD-10-CM | POA: Diagnosis not present

## 2018-02-09 MED ORDER — BUTALBITAL-APAP-CAFFEINE 50-325-40 MG PO TABS: ORAL_TABLET | ORAL | 0 refills | Status: DC

## 2018-02-09 NOTE — Assessment & Plan Note (Addendum)
Slight irregularity to one nevus on the dorsum of his left foot though no other ABCD factors noted.  Other nevi appear normal.  Refer to dermatology for further evaluation of this and the abnormal appearing toenail.

## 2018-02-09 NOTE — Assessment & Plan Note (Signed)
Atypical chest pain.  Possibly muscle strain.  He does have risk factors and has a strong family history of cardiac disease.  EKG was performed today.  Will refer to cardiology given his family history.  He is given return precautions.

## 2018-02-09 NOTE — Patient Instructions (Signed)
Nice to see you. We will check lab work today and contact you with the results. We will get you to see dermatology and cardiology. If you have persistent or recurrent chest pain please be evaluated immediately.

## 2018-02-09 NOTE — Progress Notes (Signed)
Jonathan Rumps, MD Phone: [phone]  Jonathan West is a 57 y.o. male who presents today for f/u.  CC: dm, htn  HYPERTENSION  Disease Monitoring  Home BP Monitoring 110-130/70s-80s Chest pain- see below    Dyspnea- no Medications  Compliance-  Taking amlodipine, propranolol, lisinopril.   Edema- no  DIABETES Disease Monitoring: Blood Sugar ranges-65-182, excursions with dietary indiscretion to 200s Polyuria/phagia/dipsia- no      Optho- UTD Medications: Compliance- taking amaryl, NPH 25 u, metformin Hypoglycemic symptoms-rare, feels a little nauseous and then he will eat and improve  Patient notes on a couple of occasions this past week he has had a right sternal border chest discomfort.  Notes it felt like a pulled muscle.  Lasted for a few seconds and went away on its own.  No chest pressure or tightness.  No shortness of breath.  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  The second time he was sitting there.  He notes no exertional pain.  No smoking history.  His father and his 2 grandfathers both had heart attacks at young ages less than age 56.  The patient has not had issues with chest pain previously.  He is never seen cardiology.     Social History   Tobacco Use  Smoking Status Never Smoker  Smokeless Tobacco Never Used     ROS see history of present illness  Objective  Physical Exam Vitals:   02/09/18 1452  BP: 110/70  Pulse: 65  Temp: 98.6 F (37 C)  SpO2: 99%    BP Readings from Last 3 Encounters:  02/09/18 110/70  11/09/17 112/78  08/11/17 120/72   Wt Readings from Last 3 Encounters:  02/09/18 182 lb 3.2 oz (82.6 kg)  11/09/17 183 lb 3.2 oz (83.1 kg)  08/11/17 181 lb 12.8 oz (82.5 kg)    Physical Exam  Constitutional: No distress.  Cardiovascular: Normal rate, regular rhythm and normal heart sounds.  Pulmonary/Chest: Effort normal and breath sounds normal. He exhibits tenderness (Slight tenderness over the medial aspect of his right  pectoralis muscle).  Musculoskeletal: He exhibits no edema.  Neurological: He is alert.  Skin: Skin is warm and dry. He is not diaphoretic.   Diabetic Foot Exam - Simple   Simple Foot Form Diabetic Foot exam was performed with the following findings:  Yes 02/09/2018  3:21 PM  Visual Inspection See comments:  Yes Sensation Testing Intact to touch and monofilament testing bilaterally:  Yes Pulse Check Posterior Tibialis and Dorsalis pulse intact bilaterally:  Yes Comments Whitish color to both edges of his left second toenail, missing both great toenails, nevus on the dorsum of his left foot with slight irregularity to the border though otherwise uniform color with no asymmetry or apparent color change and small size being about 3 mm in diameter    EKG: Normal sinus rhythm, rate 63, no ischemic changes noted  Assessment/Plan: Please see individual problem list.  Essential hypertension Well-controlled.  Continue current regimen.  DM (diabetes mellitus), type 2, uncontrolled Check A1c.  Continue current regimen.  Foot exam completed.  Atypical chest pain Atypical chest pain.  Possibly muscle strain.  He does have risk factors and has a strong family history of cardiac disease.  EKG was performed today.  Will refer to cardiology given his family history.  He is given return precautions.  Multiple nevi Slight irregularity to one nevus on the dorsum of his left foot though no other ABCD factors noted.  Other nevi  appear normal.  Refer to dermatology for further evaluation of this and the abnormal appearing toenail.   Orders Placed This Encounter  Procedures  . HgB A1c  . Comp Met (CMET)  . Ambulatory referral to Cardiology    Referral Priority:   Routine    Referral Type:   Consultation    Referral Reason:   Specialty Services Required    Requested Specialty:   Cardiology    Number of Visits Requested:   1  . Ambulatory referral to Dermatology    Referral Priority:   Routine     Referral Type:   Consultation    Referral Reason:   Specialty Services Required    Requested Specialty:   Dermatology    Number of Visits Requested:   1  . EKG 12-Lead    No orders of the defined types were placed in this encounter.    Jonathan Rumps, MD [place]

## 2018-02-09 NOTE — Assessment & Plan Note (Signed)
Check A1c.  Continue current regimen.  Foot exam completed.

## 2018-02-09 NOTE — Assessment & Plan Note (Signed)
Well-controlled.  Continue current regimen. 

## 2018-02-10 LAB — COMPREHENSIVE METABOLIC PANEL
ALBUMIN: 4.3 g/dL (ref 3.5–5.2)
ALT: 18 U/L (ref 0–53)
AST: 16 U/L (ref 0–37)
Alkaline Phosphatase: 89 U/L (ref 39–117)
BUN: 23 mg/dL (ref 6–23)
CALCIUM: 9.6 mg/dL (ref 8.4–10.5)
CHLORIDE: 105 meq/L (ref 96–112)
CO2: 28 mEq/L (ref 19–32)
CREATININE: 1.14 mg/dL (ref 0.40–1.50)
GFR: 69.87 mL/min (ref >60.00)
Glucose, Bld: 138 mg/dL — ABNORMAL HIGH (ref 70–99)
Potassium: 4.1 mEq/L (ref 3.5–5.1)
Sodium: 140 mEq/L (ref 135–145)
Total Bilirubin: 0.7 mg/dL (ref 0.2–1.2)
Total Protein: 7 g/dL (ref 6.0–8.3)

## 2018-02-10 LAB — HEMOGLOBIN A1C: Hgb A1c MFr Bld: 8 % — ABNORMAL HIGH (ref 4.6–6.5)

## 2018-04-26 ENCOUNTER — Other Ambulatory Visit: Payer: Self-pay | Admitting: Family Medicine

## 2018-04-28 DIAGNOSIS — Z1283 Encounter for screening for malignant neoplasm of skin: Secondary | ICD-10-CM | POA: Diagnosis not present

## 2018-04-28 DIAGNOSIS — D225 Melanocytic nevi of trunk: Secondary | ICD-10-CM | POA: Diagnosis not present

## 2018-04-28 DIAGNOSIS — L820 Inflamed seborrheic keratosis: Secondary | ICD-10-CM | POA: Diagnosis not present

## 2018-04-28 DIAGNOSIS — L578 Other skin changes due to chronic exposure to nonionizing radiation: Secondary | ICD-10-CM | POA: Diagnosis not present

## 2018-04-28 DIAGNOSIS — D485 Neoplasm of uncertain behavior of skin: Secondary | ICD-10-CM | POA: Diagnosis not present

## 2018-04-28 DIAGNOSIS — L821 Other seborrheic keratosis: Secondary | ICD-10-CM | POA: Diagnosis not present

## 2018-04-28 DIAGNOSIS — D2272 Melanocytic nevi of left lower limb, including hip: Secondary | ICD-10-CM | POA: Diagnosis not present

## 2018-04-28 DIAGNOSIS — D229 Melanocytic nevi, unspecified: Secondary | ICD-10-CM | POA: Diagnosis not present

## 2018-05-04 ENCOUNTER — Other Ambulatory Visit: Payer: Self-pay | Admitting: Family Medicine

## 2018-05-04 DIAGNOSIS — I10 Essential (primary) hypertension: Secondary | ICD-10-CM

## 2018-05-22 NOTE — Progress Notes (Deleted)
Cardiology Office Note  Date:  05/22/2018   ID:  Jonathan West, DOB June 17, 1961, MRN [id number]  PCP:  Leone Haven, MD   No chief complaint on file.   HPI:   DM  HTN Family hx of CAD Referred by Dr. Caryl Bis for consultation of his Atypical chest pain   couple of occasions this past week he has had a right sternal border chest discomfort.  Notes it felt like a pulled muscle.  Lasted for a few seconds and went away on its own.  No chest pressure or tightness.  No shortness of breath.  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  The second time he was sitting there.  He notes no exertional pain.  No smoking history.  His father and his 2 grandfathers both had heart attacks at young ages less than age 55.  The patient has not had issues with chest pain previously   PMH:   has a past medical history of Complication of anesthesia, Diabetes mellitus without complication ([place]), Hypertension, and Migraines.  PSH:    Past Surgical History:  Procedure Laterality Date  . COLONOSCOPY    . EXCISION MASS NECK Left 09/12/2015   Procedure: EXCISION MASS NECK;  Surgeon: Carloyn Manner, MD;  Location: [facility] ORS;  Service: ENT;  Laterality: Left;  . HEMORRHOID SURGERY  2005   [place]     Current Outpatient Medications  Medication Sig Dispense Refill  . amLODipine (NORVASC) 10 MG tablet TAKE 1 TABLET BY MOUTH  DAILY 90 tablet 1  . atorvastatin (LIPITOR) 10 MG tablet TAKE 1 TABLET BY MOUTH  DAILY 90 tablet 1  . blood glucose meter kit and supplies Dispense based on patient and insurance preference. Use up to four times daily as directed. (FOR ICD-9 250.00, 250.01). 1 each 0  . butalbital-acetaminophen-caffeine (FIORICET, ESGIC) 50-325-40 MG tablet TAKE 1 TABLET BY MOUTH  EVERY 6 HOURS AS NEEDED FOR HEADACHE 30 tablet 0  . cholecalciferol (VITAMIN D) 1000 UNITS tablet Take 2,000 Units by mouth daily. Reported on 09/12/2015    . gabapentin (NEURONTIN) 300 MG capsule TAKE 1 CAPSULE  BY MOUTH 3  TIMES DAILY 270 capsule 3  . glimepiride (AMARYL) 4 MG tablet TAKE 1 TABLET BY MOUTH ONCE DAILY WITH BREAKFAST 90 tablet 3  . glucose blood (ONETOUCH VERIO) test strip Check blood glucose three times daily 300 each 3  . glucose blood test strip Check glucose three times daily, Dx code E11.65. 300 each 3  . insulin NPH Human (HUMULIN N) 100 UNIT/ML injection Inject 25 Units into the skin daily.     Marland Kitchen lisinopril (PRINIVIL,ZESTRIL) 40 MG tablet Take 1 tablet (40 mg total) by mouth daily. 90 tablet 2  . metFORMIN (GLUCOPHAGE-XR) 500 MG 24 hr tablet Take 4 tablets (2,000 mg total) by mouth daily with breakfast. 360 tablet 3  . ONETOUCH DELICA LANCETS 37T MISC Check blood glucose three times daily DX: E11.65 300 each 1  . propranolol (INDERAL) 40 MG tablet TAKE 1 TABLET BY MOUTH TWO  TIMES DAILY 180 tablet 3   No current facility-administered medications for this visit.      Allergies:   Metformin   Social History:  The patient  reports that he has never smoked. He has never used smokeless tobacco. He reports that he does not drink alcohol or use drugs.   Family History:   family history includes Diabetes in his father; Heart disease in his father, maternal grandfather, and  paternal grandfather.    Review of Systems: ROS   PHYSICAL EXAM: VS:  There were no vitals taken for this visit. , BMI There is no height or weight on file to calculate BMI. GEN: Well nourished, well developed, in no acute distress  HEENT: normal  Neck: no JVD, carotid bruits, or masses Cardiac: RRR; no murmurs, rubs, or gallops,no edema  Respiratory:  clear to auscultation bilaterally, normal work of breathing GI: soft, nontender, nondistended, + BS MS: no deformity or atrophy  Skin: warm and dry, no rash Neuro:  Strength and sensation are intact Psych: euthymic mood, full affect    Recent Labs: 08/11/2017: Hemoglobin 14.6; Platelets 245.0 02/09/2018: ALT 18; BUN 23; Creatinine, Ser 1.14; Potassium  4.1; Sodium 140    Lipid Panel Lab Results  Component Value Date   CHOL 121 01/22/2017   HDL 26.50 (L) 01/22/2017   LDLCALC 65 01/22/2017   TRIG 147.0 01/22/2017      Wt Readings from Last 3 Encounters:  02/09/18 182 lb 3.2 oz (82.6 kg)  11/09/17 183 lb 3.2 oz (83.1 kg)  08/11/17 181 lb 12.8 oz (82.5 kg)       ASSESSMENT AND PLAN:  No diagnosis found.   Disposition:   F/U  6 months  No orders of the defined types were placed in this encounter.    Signed, Esmond Plants, M.D., Ph.D. 05/22/2018  [place], [place]

## 2018-05-23 ENCOUNTER — Ambulatory Visit: Payer: BLUE CROSS/BLUE SHIELD | Admitting: Cardiovascular Disease

## 2018-05-26 NOTE — Progress Notes (Signed)
Cardiology Office Note  Date:  05/27/2018   ID:  [name], DOB 07/22/1961, MRN [id number]  PCP:  Jonathan Haven, MD   Chief Complaint  Patient presents with  . other    Fam Hx of MI no complaints today denies chest pain, sob edema c/o occassional elevated BP. Meds reviewed verbally with pt.    HPI:  [name] is a 57 year old gentleman with past medical history of Diabetes HTN Family hx of CAD nonsmoker Referred by Dr. Caryl West for consultation of his Atypical chest pain, and risk stratification of his cardiac risk factors  He reports having prior history of poorly controlled diabetes He had been treated but had stopped his medications  hemoglobin A1c markedly elevated Since then has improved.  Hemoglobin A1c trending up to 8 Some dietary noncompliance  Active, denies any significant chest pain on exertion No regular exercise program  Long discussion concerning his family history Father with MI in his late 61s Brother with coronary disease at early age, grandfather with cardiac disease at early age  EKG personally reviewed by myself on todays visit Shows normal sinus rhythm rate 65 bpm no significant ST or T wave changes   PMH:   has a past medical history of Complication of anesthesia, Diabetes mellitus without complication ([place]), Hyperlipidemia, Hypertension, and Migraines.  PSH:    Past Surgical History:  Procedure Laterality Date  . COLONOSCOPY    . EXCISION MASS NECK Left 09/12/2015   Procedure: EXCISION MASS NECK;  Surgeon: Jonathan Manner, MD;  Location: [facility] ORS;  Service: ENT;  Laterality: Left;  . HEMORRHOID SURGERY  2005   [place]     Current Outpatient Medications  Medication Sig Dispense Refill  . amLODipine (NORVASC) 10 MG tablet TAKE 1 TABLET BY MOUTH  DAILY 90 tablet 1  . aspirin EC 81 MG tablet Take 81 mg by mouth daily.    Marland Kitchen atorvastatin (LIPITOR) 10 MG tablet TAKE 1 TABLET BY MOUTH  DAILY 90 tablet 1  . blood  glucose meter kit and supplies Dispense based on patient and insurance preference. Use up to four times daily as directed. (FOR ICD-9 250.00, 250.01). 1 each 0  . butalbital-acetaminophen-caffeine (FIORICET, ESGIC) 50-325-40 MG tablet TAKE 1 TABLET BY MOUTH  EVERY 6 HOURS AS NEEDED FOR HEADACHE 30 tablet 0  . cholecalciferol (VITAMIN D) 1000 UNITS tablet Take 2,000 Units by mouth daily. Reported on 09/12/2015    . gabapentin (NEURONTIN) 300 MG capsule TAKE 1 CAPSULE BY MOUTH 3  TIMES DAILY 270 capsule 3  . glimepiride (AMARYL) 4 MG tablet TAKE 1 TABLET BY MOUTH ONCE DAILY WITH BREAKFAST 90 tablet 3  . glucose blood test strip Check glucose three times daily, Dx code E11.65. 300 each 3  . insulin NPH Human (HUMULIN N) 100 UNIT/ML injection Inject 25 Units into the skin daily.     Marland Kitchen lisinopril (PRINIVIL,ZESTRIL) 40 MG tablet Take 1 tablet (40 mg total) by mouth daily. 90 tablet 2  . metFORMIN (GLUCOPHAGE-XR) 500 MG 24 hr tablet Take 4 tablets (2,000 mg total) by mouth daily with breakfast. 360 tablet 3  . ONETOUCH DELICA LANCETS 62I MISC Check blood glucose three times daily DX: E11.65 300 each 1  . Vitamin D, Ergocalciferol, 2000 units CAPS Take by mouth daily.    . propranolol ER (INDERAL LA) 60 MG 24 hr capsule Take 1 capsule (60 mg total) by mouth daily. 90 capsule 3   No current facility-administered medications for this  visit.      Allergies:   Metformin   Social History:  The patient  reports that he has never smoked. He has never used smokeless tobacco. He reports that he does not drink alcohol or use drugs.   Family History:   family history includes Diabetes in his father; Heart attack in his father, maternal grandfather, and paternal grandfather; Heart disease in his father, maternal grandfather, and paternal grandfather; Stroke in his mother.    Review of Systems: Review of Systems  Constitutional: Negative.   Respiratory: Negative.   Cardiovascular: Negative.    Gastrointestinal: Negative.   Musculoskeletal: Negative.   Neurological: Negative.   Psychiatric/Behavioral: Negative.   All other systems reviewed and are negative.    PHYSICAL EXAM: VS:  BP 113/70 (BP Location: Right Arm, Patient Position: Sitting, Cuff Size: Normal)   Pulse 65   Ht 5' 10"  (1.778 m)   Wt 188 lb 8 oz (85.5 kg)   BMI 27.05 kg/m  , BMI Body mass index is 27.05 kg/m. GEN: Well nourished, well developed, in no acute distress  HEENT: normal  Neck: no JVD, carotid bruits, or masses Cardiac: RRR; no murmurs, rubs, or gallops,no edema  Respiratory:  clear to auscultation bilaterally, normal work of breathing GI: soft, nontender, nondistended, + BS MS: no deformity or atrophy  Skin: warm and dry, no rash Neuro:  Strength and sensation are intact Psych: euthymic mood, full affect   Recent Labs: 08/11/2017: Hemoglobin 14.6; Platelets 245.0 02/09/2018: ALT 18; BUN 23; Creatinine, Ser 1.14; Potassium 4.1; Sodium 140    Lipid Panel Lab Results  Component Value Date   CHOL 121 01/22/2017   HDL 26.50 (L) 01/22/2017   LDLCALC 65 01/22/2017   TRIG 147.0 01/22/2017    LDL 58  Wt Readings from Last 3 Encounters:  05/27/18 188 lb 8 oz (85.5 kg)  02/09/18 182 lb 3.2 oz (82.6 kg)  11/09/17 183 lb 3.2 oz (83.1 kg)       ASSESSMENT AND PLAN:  Chest pain with moderate risk for cardiac etiology -  He denies have any significant chest pain Feels previous episodes of chest pain was musculoskeletal from lifting heavy pallets at work Long discussion concerning anginal symptoms to watch for We will order CT coronary calcium scoring  Mixed hyperlipidemia - Plan: EKG 12-Lead Well-controlled on statin  Uncontrolled type 2 diabetes mellitus with hyperglycemia ([place]) Long discussion concerning prior diabetes history ,  Dietary guide  Essential hypertension Blood pressure is well controlled on today's visit. No changes made to the medications.  Disposition:   F/U as  needed   Total encounter time more than 60 minutes  Greater than 50% was spent in counseling and coordination of care with the patient  Patient was seen in consultation for Dr. Biagio West and will be referred back to his office for ongoing care of the issues detailed above   Orders Placed This Encounter  Procedures  . CT CARDIAC SCORING  . EKG 12-Lead     Signed, Esmond Plants, M.D., Ph.D. 05/27/2018  [place], [place]

## 2018-05-27 ENCOUNTER — Ambulatory Visit (INDEPENDENT_AMBULATORY_CARE_PROVIDER_SITE_OTHER): Payer: BLUE CROSS/BLUE SHIELD | Admitting: Cardiovascular Disease

## 2018-05-27 ENCOUNTER — Encounter: Payer: Self-pay | Admitting: Cardiovascular Disease

## 2018-05-27 VITALS — BP 113/70 | HR 65 | Ht 70.0 in | Wt 188.5 lb

## 2018-05-27 DIAGNOSIS — E1165 Type 2 diabetes mellitus with hyperglycemia: Secondary | ICD-10-CM | POA: Diagnosis not present

## 2018-05-27 DIAGNOSIS — E782 Mixed hyperlipidemia: Secondary | ICD-10-CM | POA: Diagnosis not present

## 2018-05-27 DIAGNOSIS — R0789 Other chest pain: Secondary | ICD-10-CM

## 2018-05-27 DIAGNOSIS — R079 Chest pain, unspecified: Secondary | ICD-10-CM | POA: Diagnosis not present

## 2018-05-27 DIAGNOSIS — I10 Essential (primary) hypertension: Secondary | ICD-10-CM

## 2018-05-27 MED ORDER — PROPRANOLOL HCL ER 60 MG PO CP24: 60.0000 mg | ORAL_CAPSULE | Freq: Every day | ORAL | 3 refills | Status: DC

## 2018-05-27 NOTE — Patient Instructions (Addendum)
Medication Instructions:   No medication changes made  Labwork:  No new labs needed  Testing/Procedures:  We will order CT coronary calcium score $150 Family History   Please call [phone] to schedule  [facility] [address] [place], [place] [address]   Follow-Up: It was a pleasure seeing you in the office today. Please call us if you have new issues that need to be addressed before your next appt.  [phone]  Your physician wants you to follow-up in:  As needed  If you need a refill on your cardiac medications before your next appointment, please call your pharmacy.  For educational health videos Log in to : [email or web address] Or : [email or web address], password : [email or web address]

## 2018-05-30 ENCOUNTER — Ambulatory Visit (INDEPENDENT_AMBULATORY_CARE_PROVIDER_SITE_OTHER): Payer: BLUE CROSS/BLUE SHIELD | Admitting: Family Medicine

## 2018-05-30 ENCOUNTER — Encounter: Payer: Self-pay | Admitting: Family Medicine

## 2018-05-30 VITALS — BP 96/60 | HR 73 | Temp 98.1°F | Ht 70.0 in | Wt 187.2 lb

## 2018-05-30 DIAGNOSIS — E538 Deficiency of other specified B group vitamins: Secondary | ICD-10-CM | POA: Diagnosis not present

## 2018-05-30 DIAGNOSIS — Z23 Encounter for immunization: Secondary | ICD-10-CM

## 2018-05-30 DIAGNOSIS — E1165 Type 2 diabetes mellitus with hyperglycemia: Secondary | ICD-10-CM | POA: Diagnosis not present

## 2018-05-30 DIAGNOSIS — I10 Essential (primary) hypertension: Secondary | ICD-10-CM

## 2018-05-30 DIAGNOSIS — E782 Mixed hyperlipidemia: Secondary | ICD-10-CM | POA: Diagnosis not present

## 2018-05-30 NOTE — Assessment & Plan Note (Signed)
-

## 2018-05-30 NOTE — Assessment & Plan Note (Signed)
Check B12 

## 2018-05-30 NOTE — Assessment & Plan Note (Signed)
Well-controlled.  Continue current regimen. 

## 2018-05-30 NOTE — Assessment & Plan Note (Signed)
Check A1c.  Continue current regimen. 

## 2018-05-30 NOTE — Progress Notes (Signed)
  [name], MD Phone: [phone]  Jonathan West is a 57 y.o. male who presents today for f/u.  CC: dm, HTN, HLD  HYPERTENSION Disease Monitoring: Blood pressure range-120-130/70-80  Chest pain- no      Dyspnea- no Medications: Compliance- taking amlodipine, lisinopril    Edema- no  DIABETES Disease Monitoring: Blood Sugar ranges-150-170 Polyuria/phagia/dipsia- no      Optho- UTD Medications: Compliance- glimeperide, metformin, NPH 27 u daily Hypoglycemic symptoms- no, one CBG of 65 without symptoms  HYPERLIPIDEMIA Disease Monitoring: See symptoms for Hypertension Medications: Compliance- taking lipitor Right upper quadrant pain- no  Muscle aches- no  He saw cardiology and they plan on doing cardiac CT to evaluate cardiac risk.     Social History   Tobacco Use  Smoking Status Never Smoker  Smokeless Tobacco Never Used     ROS see history of present illness  Objective  Physical Exam Vitals:   05/30/18 1539  BP: 96/60  Pulse: 73  Temp: 98.1 F (36.7 C)  SpO2: 97%    BP Readings from Last 3 Encounters:  05/30/18 96/60  05/27/18 113/70  02/09/18 110/70   Wt Readings from Last 3 Encounters:  05/30/18 187 lb 3.2 oz (84.9 kg)  05/27/18 188 lb 8 oz (85.5 kg)  02/09/18 182 lb 3.2 oz (82.6 kg)    Physical Exam  Constitutional: No distress.  Cardiovascular: Normal rate, regular rhythm and normal heart sounds.  Pulmonary/Chest: Effort normal and breath sounds normal.  Musculoskeletal: He exhibits no edema.  Neurological: He is alert.  Skin: Skin is warm and dry. He is not diaphoretic.     Assessment/Plan: Please see individual problem list.  Essential hypertension Well-controlled.  Continue current regimen.  B12 deficiency Check B12.  DM (diabetes mellitus), type 2, uncontrolled Check A1c.  Continue current regimen.  Hyperlipidemia Continue Lipitor.  Check lipid panel.   Health Maintenance: Tdap today.  Orders Placed This  Encounter  Procedures  . Flu Vaccine QUAD 6+ mos PF IM (Fluarix Quad PF)  . Tdap vaccine greater than or equal to 7yo IM  . Lipid panel  . Basic Metabolic Panel (BMET)  . HgB A1c  . B12    No orders of the defined types were placed in this encounter.    [name], MD [place]

## 2018-05-30 NOTE — Patient Instructions (Signed)
Nice to see you. We will check lab work today and contact you with the results. 

## 2018-05-31 LAB — BASIC METABOLIC PANEL
BUN: 26 mg/dL — AB (ref 6–23)
CO2: 22 meq/L (ref 19–32)
Calcium: 9.8 mg/dL (ref 8.4–10.5)
Chloride: 104 mEq/L (ref 96–112)
Creatinine, Ser: 1.32 mg/dL (ref 0.40–1.50)
GFR: 58.94 mL/min — ABNORMAL LOW (ref >60.00)
GLUCOSE: 155 mg/dL — AB (ref 70–99)
POTASSIUM: 4.2 meq/L (ref 3.5–5.1)
Sodium: 138 mEq/L (ref 135–145)

## 2018-05-31 LAB — HEMOGLOBIN A1C: Hgb A1c MFr Bld: 9.1 % — ABNORMAL HIGH (ref 4.6–6.5)

## 2018-05-31 LAB — LIPID PANEL
CHOLESTEROL: 102 mg/dL (ref 0–200)
HDL: 30.2 mg/dL — ABNORMAL LOW (ref >39.00)
LDL CALC: 46 mg/dL (ref 0–99)
NonHDL: 71.44
TRIGLYCERIDES: 129 mg/dL (ref 0.0–149.0)
Total CHOL/HDL Ratio: 3
VLDL: 25.8 mg/dL (ref 0.0–40.0)

## 2018-05-31 LAB — VITAMIN B12: Vitamin B-12: 155 pg/mL — ABNORMAL LOW (ref 211–911)

## 2018-06-01 ENCOUNTER — Encounter: Payer: Self-pay | Admitting: Family Medicine

## 2018-06-01 ENCOUNTER — Other Ambulatory Visit: Payer: Self-pay

## 2018-06-01 MED ORDER — METFORMIN HCL ER 500 MG PO TB24: 2000.0000 mg | ORAL_TABLET | Freq: Every day | ORAL | 3 refills | Status: DC

## 2018-06-07 ENCOUNTER — Telehealth: Payer: Self-pay

## 2018-06-07 NOTE — Telephone Encounter (Signed)
Jonathan West Could you please schedule this pt for an office visit with our clinical pharmacist to discuss DM management. Per suggested by PCP Dr. Caryl Bis.   Thanks

## 2018-06-08 ENCOUNTER — Other Ambulatory Visit: Payer: Self-pay | Admitting: Family Medicine

## 2018-06-08 DIAGNOSIS — N179 Acute kidney failure, unspecified: Secondary | ICD-10-CM

## 2018-06-09 NOTE — Telephone Encounter (Signed)
Patient has been scheduled

## 2018-06-13 ENCOUNTER — Other Ambulatory Visit (INDEPENDENT_AMBULATORY_CARE_PROVIDER_SITE_OTHER): Payer: BLUE CROSS/BLUE SHIELD

## 2018-06-13 ENCOUNTER — Encounter: Payer: Self-pay | Admitting: Pharmacist

## 2018-06-13 ENCOUNTER — Telehealth: Payer: Self-pay | Admitting: Family Medicine

## 2018-06-13 ENCOUNTER — Ambulatory Visit (INDEPENDENT_AMBULATORY_CARE_PROVIDER_SITE_OTHER): Payer: BLUE CROSS/BLUE SHIELD | Admitting: Pharmacist

## 2018-06-13 VITALS — BP 109/70 | HR 64 | Wt 188.0 lb

## 2018-06-13 DIAGNOSIS — N179 Acute kidney failure, unspecified: Secondary | ICD-10-CM | POA: Diagnosis not present

## 2018-06-13 DIAGNOSIS — I10 Essential (primary) hypertension: Secondary | ICD-10-CM

## 2018-06-13 DIAGNOSIS — E538 Deficiency of other specified B group vitamins: Secondary | ICD-10-CM | POA: Diagnosis not present

## 2018-06-13 DIAGNOSIS — E1165 Type 2 diabetes mellitus with hyperglycemia: Secondary | ICD-10-CM

## 2018-06-13 LAB — BASIC METABOLIC PANEL
BUN: 17 mg/dL (ref 6–23)
CALCIUM: 9.6 mg/dL (ref 8.4–10.5)
CHLORIDE: 103 meq/L (ref 96–112)
CO2: 28 mEq/L (ref 19–32)
CREATININE: 1.2 mg/dL (ref 0.40–1.50)
GFR: 65.78 mL/min (ref >60.00)
Glucose, Bld: 231 mg/dL — ABNORMAL HIGH (ref 70–99)
Potassium: 4.7 mEq/L (ref 3.5–5.1)
Sodium: 137 mEq/L (ref 135–145)

## 2018-06-13 MED ORDER — AMLODIPINE BESYLATE 10 MG PO TABS: 5.0000 mg | ORAL_TABLET | Freq: Every day | ORAL | 1 refills | Status: DC

## 2018-06-13 MED ORDER — EMPAGLIFLOZIN 10 MG PO TABS: 10.0000 mg | ORAL_TABLET | Freq: Every day | ORAL | 1 refills | Status: DC

## 2018-06-13 MED ORDER — CYANOCOBALAMIN 1000 MCG/ML IJ SOLN
1000.0000 ug | Freq: Once | INTRAMUSCULAR | Status: AC
  Administered 2018-06-13: 1000 ug via INTRAMUSCULAR

## 2018-06-13 NOTE — Telephone Encounter (Signed)
Contacted patient, left message.   Patient returned my call, left message stating that Jonathan West was $15 before a coupon card, which brought the cost to $0.   Patient scheduled for f/u.   [name], PharmD PGY2 Ambulatory Care Pharmacy Resident, [facility] Phone: [phone]

## 2018-06-13 NOTE — Patient Instructions (Addendum)
It was great to meet you today!  We are going to restart the Jardiance (empagliflozin) at 10 mg daily. For now, decrease amlodipine to 5 mg daily (1/2 of the tablet you have). Make sure you stay well hydrated.   Continue metformin XR 2000 mg daily (4 tablets), glimepiride 4 mg in the morning, and insulin NPH 27 units daily for now. If you start to feel low blood sugars in the midmorning/early afternoon, decrease the glimepiride to 2 mg (1/2 a tablet).   Schedule follow up with me in 3-4 weeks. Feel free to reach out with any questions/concerns.   [name], PharmD

## 2018-06-13 NOTE — Telephone Encounter (Signed)
Please advise 

## 2018-06-13 NOTE — Assessment & Plan Note (Signed)
#  Diabetes - Currently uncontrolled, most recent A1c 9.1% on 05/30/2018, worsened from 8.0% on 02/09/2018; Goal A1c <7%.  Patient denies hypoglycemic events. Patient reports adherence with medication. Patient previously tolerated Jardiance therapy, but might have had insurance difficulties at that time. - Start Jardiance 10 mg daily. Provided coupon card. Counseled on the importance of hydration and sick day rules. - Continue metformin XR 2000 mg daily, glimepiride 4 mg daily, NPH 27 units HS for now. Counseled to decrease glimepiride to 2 mg daily if beginning to experience midday hypoglycemia. Moving forward, consider splitting NPH to BID administration to more fully cover during the day, or at least moving to AM administration to cover during the day

## 2018-06-13 NOTE — Telephone Encounter (Signed)
Copied from [place] [id number]. Topic: Quick Communication - See Telephone Encounter >> Jun 13, 2018  3:52 PM Blase Mess A wrote: CRM for notification. See Telephone encounter for: 06/13/18. Patient is calling to speak to [name] Pharmist please advise [phone]

## 2018-06-13 NOTE — Progress Notes (Signed)
S:     Chief Complaint  Patient presents with  . Medication Management    Diabetes    Patient arrives in good spirits, ambulating without assistance  Presents for diabetes evaluation, education, and management at the request of Dr. Caryl Bis (referred on 05/30/2018 lab work). Last seen by primary care provider on 05/30/2018.  He notes that ~5 years ago, he went through a period where he stopped taking his medications d/t being lost to follow up. He lost ~30 lbs, had constantly polyuria/polydipsia, and eventually had A1c checked and was ~16%. Since then, he has quit drinking all sodas and has been adherent to his medications. He indicates that the recent increase in SMBG has been d/t dietary indiscretions.   Patient reports Diabetes was diagnosed in at age 57  Insurance coverage/medication affordability: [employer]  Patient reports adherence with medications.  Current diabetes medications include: Novolin NPH 27 units HS, metformin XR 2000 mg daily Current hypertension medications include: amlodipine 10 mg daily, lisinopril 40 mg daily, propranolol ER 60 mg daily   Patient denies hypoglycemic s/sx including dizziness, shakiness, sweating. Patient reports an increase in polyuria, polyphagia.  O:  Physical Exam  Constitutional: He appears well-developed and well-nourished.     Review of Systems  All other systems reviewed and are negative.    Lab Results  Component Value Date   HGBA1C 9.1 (H) 05/30/2018   Vitals:   06/13/18 0938  BP: 109/70  Pulse: 64    Basic Metabolic Panel BMP Latest Ref Rng & Units 05/30/2018 02/09/2018 08/11/2017  Glucose 70 - 99 mg/dL 155(H) 138(H) 153(H)  BUN 6 - 23 mg/dL 26(H) 23 13  Creatinine 0.40 - 1.50 mg/dL 1.32 1.14 1.06  Sodium 135 - 145 mEq/L 138 140 141  Potassium 3.5 - 5.1 mEq/L 4.2 4.1 4.6  Chloride 96 - 112 mEq/L 104 105 107  CO2 19 - 32 mEq/L 22 28 29   Calcium 8.4 - 10.5 mg/dL 9.8 9.6 9.1    Lipid Panel     Component  Value Date/Time   CHOL 102 05/30/2018 1600   TRIG 129.0 05/30/2018 1600   HDL 30.20 (L) 05/30/2018 1600   CHOLHDL 3 05/30/2018 1600   VLDL 25.8 05/30/2018 1600   LDLCALC 46 05/30/2018 1600   LDLDIRECT 58.0 09/13/2017 1232    Fasting SMBG: 130s-250s  Clinical ASCVD: No ;   ASCVD risk factors: age 81-75  A/P: Following discussion and approval by Dr. Caryl Bis, the following medication changes were made:   #Diabetes - Currently uncontrolled, most recent A1c 9.1% on 05/30/2018, worsened from 8.0% on 02/09/2018; Goal A1c <7%.  Patient denies hypoglycemic events. Patient reports adherence with medication. Patient previously tolerated Jardiance therapy, but might have had insurance difficulties at that time. - Start Jardiance 10 mg daily. Provided coupon card. Counseled on the importance of hydration and sick day rules. - Continue metformin XR 2000 mg daily, glimepiride 4 mg daily, NPH 27 units HS for now. Counseled to decrease glimepiride to 2 mg daily if beginning to experience midday hypoglycemia. Moving forward, consider splitting NPH to BID administration to more fully cover during the day, or at least moving to AM administration to cover during the day  #Hypertension currently well controlled. Goal BP <140/90 Patient reports adherence with medication. - D/t diuretic effect of Jardiance, will preemptively reduce amlodipine to 5 mg daily to prevent hypoglycemia - Continue lisinopril 40 mg, propranolol ER 60 mg   #ASCVD risk - primary prevention in patient aged 26-75  with DM, patient well managed on moderate intensity statin - Continue atorvastatin 10 mg daily   Written patient instructions provided.  Total time in face to face counseling 60 minutes.    Follow up in Pharmacist Clinic Visit 3-4 weeks.   De Hollingshead, PharmD PGY2 Ambulatory Care Pharmacy Resident Phone: [phone]

## 2018-06-13 NOTE — Progress Notes (Signed)
Patient presented for B 12 injection to left deltoid, patient voiced no concerns nor showed any signs of distress during injection. 

## 2018-06-13 NOTE — Assessment & Plan Note (Signed)
#  Hypertension currently well controlled. Goal BP <140/90 Patient reports adherence with medication. - D/t diuretic effect of Jardiance, will preemptively reduce amlodipine to 5 mg daily to prevent hypoglycemia - Continue lisinopril 40 mg, propranolol ER 60 mg

## 2018-06-14 MED ORDER — BUTALBITAL-APAP-CAFFEINE 50-325-40 MG PO TABS: ORAL_TABLET | ORAL | 0 refills | Status: DC

## 2018-06-14 NOTE — Addendum Note (Signed)
Addended by: De Hollingshead on: 06/14/2018 08:27 AM   Modules accepted: Orders

## 2018-06-19 NOTE — Progress Notes (Signed)
I have reviewed the above note and agree. I saw the patient with the clinical pharmacist.  Keagan Anthis, MD  

## 2018-06-20 ENCOUNTER — Ambulatory Visit (INDEPENDENT_AMBULATORY_CARE_PROVIDER_SITE_OTHER): Payer: BLUE CROSS/BLUE SHIELD | Admitting: *Deleted

## 2018-06-20 ENCOUNTER — Encounter: Payer: Self-pay | Admitting: Family Medicine

## 2018-06-20 ENCOUNTER — Ambulatory Visit (INDEPENDENT_AMBULATORY_CARE_PROVIDER_SITE_OTHER)
Admission: RE | Admit: 2018-06-20 | Discharge: 2018-06-20 | Disposition: A | Discharge status: Home / Self Care | Payer: Self-pay | Source: Ambulatory Visit | Attending: Cardiovascular Disease | Admitting: Cardiovascular Disease

## 2018-06-20 ENCOUNTER — Ambulatory Visit: Payer: BLUE CROSS/BLUE SHIELD

## 2018-06-20 DIAGNOSIS — E538 Deficiency of other specified B group vitamins: Secondary | ICD-10-CM | POA: Diagnosis not present

## 2018-06-20 DIAGNOSIS — R079 Chest pain, unspecified: Secondary | ICD-10-CM

## 2018-06-20 MED ORDER — CYANOCOBALAMIN 1000 MCG/ML IJ SOLN
1000.0000 ug | Freq: Once | INTRAMUSCULAR | Status: AC
  Administered 2018-06-20: 1000 ug via INTRAMUSCULAR

## 2018-06-20 NOTE — Progress Notes (Signed)
Patient presented for B 12 injection to right deltoid, patient voiced no concerns nor showed any signs of distress during injection. 

## 2018-06-23 ENCOUNTER — Ambulatory Visit: Payer: BLUE CROSS/BLUE SHIELD

## 2018-06-24 ENCOUNTER — Telehealth: Payer: Self-pay | Admitting: Family Medicine

## 2018-06-24 NOTE — Telephone Encounter (Signed)
Copied from [place] [id number]. Topic: General - Other >> Jun 24, 2018  4:59 PM Keene Breath wrote: Reason for CRM: Patient called to schedule a B12 appointment.  There were no appointments available before 8:45 and patient stated that Tye [place] said he could be double booked because of his work restrictions.  Patient would like Cathy to call him back to confirm.  CB# [phone].

## 2018-06-27 NOTE — Telephone Encounter (Signed)
Patient scheduled.

## 2018-06-29 ENCOUNTER — Telehealth: Payer: Self-pay | Admitting: *Deleted

## 2018-06-29 ENCOUNTER — Other Ambulatory Visit: Payer: Self-pay | Admitting: Family Medicine

## 2018-06-29 NOTE — Telephone Encounter (Signed)
Results called to pt. Pt verbalized understanding of results and recommendations.

## 2018-06-29 NOTE — Telephone Encounter (Signed)
-----   Message from Minna Merritts, MD sent at 06/25/2018  2:47 PM EDT ----- CT coronary calcium score was mild to moderately elevated,  345, placing him at the 88 th percentile based on his age Would recommend aggressive diabetes control as high HBA1C can make atherosclerosis buildup progress. Currently does not need any further testing

## 2018-06-30 ENCOUNTER — Ambulatory Visit (INDEPENDENT_AMBULATORY_CARE_PROVIDER_SITE_OTHER): Payer: BLUE CROSS/BLUE SHIELD

## 2018-06-30 DIAGNOSIS — E538 Deficiency of other specified B group vitamins: Secondary | ICD-10-CM | POA: Diagnosis not present

## 2018-06-30 MED ORDER — CYANOCOBALAMIN 1000 MCG/ML IJ SOLN
1000.0000 ug | Freq: Once | INTRAMUSCULAR | Status: AC
  Administered 2018-06-30: 1000 ug via INTRAMUSCULAR

## 2018-06-30 NOTE — Progress Notes (Signed)
Pt was here today for B-12 shot given in LD. Pt tolerated well.

## 2018-07-04 ENCOUNTER — Encounter: Payer: Self-pay | Admitting: Pharmacist

## 2018-07-04 ENCOUNTER — Ambulatory Visit (INDEPENDENT_AMBULATORY_CARE_PROVIDER_SITE_OTHER): Payer: BLUE CROSS/BLUE SHIELD | Admitting: Pharmacist

## 2018-07-04 VITALS — BP 109/71 | HR 82 | Wt 188.4 lb

## 2018-07-04 DIAGNOSIS — E1165 Type 2 diabetes mellitus with hyperglycemia: Secondary | ICD-10-CM | POA: Diagnosis not present

## 2018-07-04 DIAGNOSIS — I10 Essential (primary) hypertension: Secondary | ICD-10-CM | POA: Diagnosis not present

## 2018-07-04 MED ORDER — INSULIN DEGLUDEC 100 UNIT/ML ~~LOC~~ SOPN: 20.0000 [IU] | PEN_INJECTOR | Freq: Every day | SUBCUTANEOUS | 2 refills | Status: DC

## 2018-07-04 MED ORDER — GLIMEPIRIDE 4 MG PO TABS: 2.0000 mg | ORAL_TABLET | Freq: Every day | ORAL | 3 refills | Status: DC

## 2018-07-04 MED ORDER — INSULIN PEN NEEDLE 29G X 12.7MM MISC: 1 refills | Status: DC

## 2018-07-04 NOTE — Progress Notes (Signed)
S:     Chief Complaint  Patient presents with  . Medication Management    Diabetes    Patient arrives in good spirits, ambulating without assistance  Presents for diabetes evaluation, education, and management at the request of Dr. Caryl Bis (referred on 05/30/2018 lab work). Last seen by primary care provider on 05/30/2018. Last seen by Pharmacy clinic on 06/13/2018 - at that time, Jardiance 10 mg was initiated.   Notes some episodes of feeling low BG during the day, as well as first thing in the morning. FBG have widely ranged from 50s to >200s. Denies any genitourinary complaints with Jardiance therapy, and no s/sx orthostatic hypotension d/t dehydration.   Patient reports Diabetes was diagnosed in at age 33  Insurance coverage/medication affordability: Pharmacologist - Coupon card for [employer] -> $0/month  Patient reports adherence with medications.  Current diabetes medications include: Novolin NPH 27 units HS, metformin XR 2000 mg daily, Jardiance 10 mg daily, glimepiride 4 mg QAM Current hypertension medications include: amlodipine 5 mg daily, lisinopril 40 mg daily, propranolol ER 60 mg daily   Patient reports feeling "funny" when SMBG low. Patient reports an increase in polyuria, polyphagia.  O:  Physical Exam  Constitutional: He appears well-developed and well-nourished.     Review of Systems  All other systems reviewed and are negative.    Lab Results  Component Value Date   HGBA1C 9.1 (H) 05/30/2018   Vitals:   07/04/18 1024  BP: 109/71  Pulse: 82    Basic Metabolic Panel BMP Latest Ref Rng & Units 06/13/2018 05/30/2018 02/09/2018  Glucose 70 - 99 mg/dL 231(H) 155(H) 138(H)  BUN 6 - 23 mg/dL 17 26(H) 23  Creatinine 0.40 - 1.50 mg/dL 1.20 1.32 1.14  Sodium 135 - 145 mEq/L 137 138 140  Potassium 3.5 - 5.1 mEq/L 4.7 4.2 4.1  Chloride 96 - 112 mEq/L 103 104 105  CO2 19 - 32 mEq/L 28 22 28   Calcium 8.4 - 10.5 mg/dL 9.6 9.8 9.6    Lipid Panel       Component Value Date/Time   CHOL 102 05/30/2018 1600   TRIG 129.0 05/30/2018 1600   HDL 30.20 (L) 05/30/2018 1600   CHOLHDL 3 05/30/2018 1600   VLDL 25.8 05/30/2018 1600   LDLCALC 46 05/30/2018 1600   LDLDIRECT 58.0 09/13/2017 1232    Fasting SMBG:  - 2 episodes <70, highest 256;  - 3-4 hour post breakfast range 110s - 150s  Clinical ASCVD: No ;   ASCVD risk factors: age 11-75  A/P: Following discussion and approval by Dr. Caryl Bis, the following medication changes were made:   #Diabetes - Currently uncontrolled, most recent A1c 9.1% on 05/30/2018, worsened from 8.0% on 02/09/2018; Goal A1c <7%.  Reports some s/sx hypoglycemia, but not consistently, likely related to variable effects of NPH insulin. Additionally, NPH insulin dosed in the evening is not covering him during the day. Appropriate to change to true basal insulin - Discontinue insulin NPH; start Tresiba (insulin degludec) 20 units daily (dose reduction d/t increased potency of Tresiba) - Decrease glimepiride to 2 mg QAM to reduce risk of daytime hypoglycemia - Continue Jardiance 10 mg daily and metformin XR 2000 mg daily. Moving forward, consider Jardiance titration - Counseled to contact clinic if s/sx daytime hypoglycemia; insulin dose can be reduced  #Hypertension currently well controlled. Goal BP <140/90 Patient reports adherence with medication. - Continue lisinopril 40 mg, propranolol ER 60 mg, amlodipine 5 mg daily  #ASCVD risk -  primary prevention in patient aged 30-75 with DM, patient well managed on moderate intensity statin - Continue atorvastatin 10 mg daily   Written patient instructions provided.  Total time in face to face counseling 60 minutes.    Follow up in Pharmacist Clinic Visit 3-4 weeks.   De Hollingshead, PharmD PGY2 Ambulatory Care Pharmacy Resident Phone: [phone]

## 2018-07-04 NOTE — Assessment & Plan Note (Signed)
#  Diabetes - Currently uncontrolled, most recent A1c 9.1% on 05/30/2018, worsened from 8.0% on 02/09/2018; Goal A1c <7%.  Reports some s/sx hypoglycemia, but not consistently, likely related to variable effects of NPH insulin. Additionally, NPH insulin dosed in the evening is not covering Jonathan West during the day. Appropriate to change to true basal insulin - Discontinue insulin NPH; start Tresiba (insulin degludec) 20 units daily (dose reduction d/t increased potency of Tresiba) - Decrease glimepiride to 2 mg QAM to reduce risk of daytime hypoglycemia - Continue Jardiance 10 mg daily and metformin XR 2000 mg daily. Moving forward, consider Jardiance titration - Counseled to contact clinic if s/sx daytime hypoglycemia; insulin dose can be reduced

## 2018-07-04 NOTE — Assessment & Plan Note (Signed)
#  Hypertension currently well controlled. Goal BP <140/90 Patient reports adherence with medication. - Continue lisinopril 40 mg, propranolol ER 60 mg, amlodipine 5 mg daily

## 2018-07-04 NOTE — Patient Instructions (Signed)
It was great to see you today!  We are going to change the insulin NPH to a more long acting, more consistently releasing insulin - Tresiba (insulin degludec). As this medication can be more potent than NPH insulin, I am going to start you at [place] 20 units once daily. You can still give it every evening.   Continue Jardiance at 10 mg and metformin XR 2000 mg daily. Decrease glimepiride to 2 mg with breakfast.  Keep checking blood sugars like you are doing.    Give me a call if there are any questions with the [place] coupon card. If you start to experience BG consistently less than 100, also give clinic a call, and we can see about adjusting the insulin.    Schedule follow up with me in 3-4 weeks.   [name], PharmD

## 2018-07-05 NOTE — Progress Notes (Signed)
I have reviewed the above note and agree. I saw the patient with the clinical pharmacist.  Eric Sonnenberg, MD  

## 2018-07-08 ENCOUNTER — Encounter: Payer: Self-pay | Admitting: Family Medicine

## 2018-07-08 ENCOUNTER — Ambulatory Visit (INDEPENDENT_AMBULATORY_CARE_PROVIDER_SITE_OTHER): Payer: BLUE CROSS/BLUE SHIELD

## 2018-07-08 DIAGNOSIS — E538 Deficiency of other specified B group vitamins: Secondary | ICD-10-CM

## 2018-07-08 MED ORDER — CYANOCOBALAMIN 1000 MCG/ML IJ SOLN
1000.0000 ug | Freq: Once | INTRAMUSCULAR | Status: AC
  Administered 2018-07-08: 1000 ug via INTRAMUSCULAR

## 2018-07-08 NOTE — Progress Notes (Signed)
Patient presents for B12 injection.  Administered R deltoid. No verbal complaints during or post administration.  Continue once monthly per order.

## 2018-07-22 ENCOUNTER — Telehealth: Payer: Self-pay

## 2018-07-22 MED ORDER — INSULIN GLARGINE 100 UNITS/ML SOLOSTAR PEN: 20.0000 [IU] | PEN_INJECTOR | Freq: Every day | SUBCUTANEOUS | 1 refills | Status: DC

## 2018-07-22 NOTE — Telephone Encounter (Signed)
Copied from [place] [id number]. Topic: General - Other >> Jul 22, 2018  9:50 AM Keene Breath wrote: Reason for CRM: Patient called to speak with the nurse or doctor regarding his insulin degludec (TRESIBA FLEXTOUCH) 100 UNIT/ML SOPN FlexTouch Pen medication which was denied by his insurance.  The insurance did say there were a few alternative medications that were covered if he would like to take one of them.  Patient needs an authorization.  Please call patient back at [phone]

## 2018-07-22 NOTE — Telephone Encounter (Signed)
Also new prescription should go to [facility].

## 2018-07-22 NOTE — Telephone Encounter (Signed)
Please advise? Patient is going to go ahead & buy a new bottle of Regular insulin so he doesn't go without taking any.

## 2018-07-22 NOTE — Telephone Encounter (Signed)
We can have him use Lantus.  We will fax this to his pharmacy on Monday.

## 2018-07-22 NOTE — Addendum Note (Signed)
Addended by: Caryl Bis, Maude Hettich G on: 07/22/2018 05:00 PM   Modules accepted: Orders

## 2018-08-11 ENCOUNTER — Ambulatory Visit (INDEPENDENT_AMBULATORY_CARE_PROVIDER_SITE_OTHER): Payer: BLUE CROSS/BLUE SHIELD

## 2018-08-11 DIAGNOSIS — E538 Deficiency of other specified B group vitamins: Secondary | ICD-10-CM

## 2018-08-11 MED ORDER — CYANOCOBALAMIN 1000 MCG/ML IJ SOLN
1000.0000 ug | Freq: Once | INTRAMUSCULAR | Status: AC
  Administered 2018-08-11: 1000 ug via INTRAMUSCULAR

## 2018-08-11 NOTE — Progress Notes (Signed)
Pt was seen today for B-12 shot given IM on the LD. Pt tolerated well.

## 2018-08-18 LAB — HM DIABETES EYE EXAM

## 2018-08-30 ENCOUNTER — Other Ambulatory Visit: Payer: Self-pay

## 2018-08-30 MED ORDER — INSULIN GLARGINE 100 UNITS/ML SOLOSTAR PEN: 20.0000 [IU] | PEN_INJECTOR | Freq: Every day | SUBCUTANEOUS | 1 refills | Status: DC

## 2018-09-14 ENCOUNTER — Ambulatory Visit: Payer: BLUE CROSS/BLUE SHIELD | Admitting: Family Medicine

## 2018-09-18 ENCOUNTER — Other Ambulatory Visit: Payer: Self-pay | Admitting: Family Medicine

## 2018-09-19 ENCOUNTER — Other Ambulatory Visit: Payer: Self-pay | Admitting: Family Medicine

## 2018-09-24 ENCOUNTER — Other Ambulatory Visit: Payer: Self-pay | Admitting: Family Medicine

## 2018-10-17 ENCOUNTER — Encounter: Payer: Self-pay | Admitting: Family Medicine

## 2018-10-18 ENCOUNTER — Other Ambulatory Visit: Payer: Self-pay | Admitting: *Deleted

## 2018-10-18 DIAGNOSIS — E1165 Type 2 diabetes mellitus with hyperglycemia: Secondary | ICD-10-CM

## 2018-10-18 MED ORDER — INSULIN GLARGINE 100 UNITS/ML SOLOSTAR PEN: 20.0000 [IU] | PEN_INJECTOR | Freq: Every day | SUBCUTANEOUS | 1 refills | Status: DC

## 2018-10-18 MED ORDER — INSULIN PEN NEEDLE 29G X 12.7MM MISC: 1 refills | Status: DC

## 2018-10-18 MED ORDER — EMPAGLIFLOZIN 10 MG PO TABS: 10.0000 mg | ORAL_TABLET | Freq: Every day | ORAL | 1 refills | Status: DC

## 2018-10-18 NOTE — Telephone Encounter (Signed)
Okay to refill? Last seen in September 2019

## 2018-10-18 NOTE — Telephone Encounter (Signed)
Refill sent to pharmacy.  Insulin will need to be faxed.  Please contact the patient to get him scheduled for follow-up.  He could follow-up with me or our clinical pharmacist for his diabetes.  Thanks.

## 2018-10-25 NOTE — Telephone Encounter (Signed)
Mailed letter °

## 2018-11-18 ENCOUNTER — Encounter: Payer: Self-pay | Admitting: Family Medicine

## 2018-11-18 ENCOUNTER — Other Ambulatory Visit: Payer: Self-pay

## 2018-11-18 MED ORDER — ATORVASTATIN CALCIUM 10 MG PO TABS: 10.0000 mg | ORAL_TABLET | Freq: Every day | ORAL | 1 refills | Status: DC

## 2018-11-18 MED ORDER — PROPRANOLOL HCL ER 60 MG PO CP24: 60.0000 mg | ORAL_CAPSULE | Freq: Every day | ORAL | 3 refills | Status: DC

## 2018-11-18 MED ORDER — GABAPENTIN 300 MG PO CAPS: 300.0000 mg | ORAL_CAPSULE | Freq: Three times a day (TID) | ORAL | 1 refills | Status: DC

## 2018-11-18 MED ORDER — LISINOPRIL 40 MG PO TABS: 40.0000 mg | ORAL_TABLET | Freq: Every day | ORAL | 2 refills | Status: DC

## 2019-03-06 ENCOUNTER — Ambulatory Visit: Payer: BLUE CROSS/BLUE SHIELD | Admitting: Family Medicine

## 2019-04-14 ENCOUNTER — Encounter: Payer: Self-pay | Admitting: Family Medicine

## 2019-04-14 ENCOUNTER — Ambulatory Visit (INDEPENDENT_AMBULATORY_CARE_PROVIDER_SITE_OTHER): Payer: BLUE CROSS/BLUE SHIELD | Admitting: Family Medicine

## 2019-04-14 ENCOUNTER — Other Ambulatory Visit: Payer: Self-pay

## 2019-04-14 ENCOUNTER — Telehealth: Payer: Self-pay | Admitting: Family Medicine

## 2019-04-14 ENCOUNTER — Telehealth: Payer: Self-pay | Admitting: Lab

## 2019-04-14 DIAGNOSIS — L237 Allergic contact dermatitis due to plants, except food: Secondary | ICD-10-CM

## 2019-04-14 MED ORDER — FEXOFENADINE HCL 180 MG PO TABS: 180.0000 mg | ORAL_TABLET | Freq: Every day | ORAL | 2 refills | Status: DC

## 2019-04-14 MED ORDER — PREDNISONE 10 MG (21) PO TBPK: ORAL_TABLET | ORAL | 0 refills | Status: DC

## 2019-04-14 MED ORDER — FAMOTIDINE 40 MG PO TABS: 40.0000 mg | ORAL_TABLET | Freq: Every day | ORAL | 0 refills | Status: DC

## 2019-04-14 NOTE — Telephone Encounter (Signed)
Pt called for an update on his [facility] message regarding getting Rx for prednisone. Pt requests call back

## 2019-04-14 NOTE — Telephone Encounter (Signed)
Medication Refill - Medication: famotidine (PEPCID) 40 MG tablet,fexofenadine (ALLEGRA ALLERGY) 180 MG tablet ,predniSONE (STERAPRED UNI-PAK 21 TAB) 10 MG (21) TBPK tablet (Patient needs a callback and pharmacy needs a callback as well. Patient stated that pharmacy stated it was too soon for him to get his medications however the medication sent over were new meds for patient today.  Has the patient contacted their pharmacy? Yes (Agent: If no, request that the patient contact the pharmacy for the refill.) (Agent: If yes, when and what did the pharmacy advise?)Contact PCP  Preferred Pharmacy (with phone number or street name): [id number] [address], [place] [phone] (Phone) [phone] (Fax)     Agent: Please be advised that RX refills may take up to 3 business days. We ask that you follow-up with your pharmacy.

## 2019-04-14 NOTE — Progress Notes (Signed)
Patient ID: Jonathan West, male   DOB: 1961/01/06, 58 y.o.   MRN: [id number]    Virtual Visit via video Note  This visit type was conducted due to national recommendations for restrictions regarding the COVID-19 pandemic (e.g. social distancing).  This format is felt to be most appropriate for this patient at this time.  All issues noted in this document were discussed and addressed.  No physical exam was performed (except for noted visual exam findings with Video Visits).   I connected with Jonathan West today at  2:20 PM EDT by a video enabled telemedicine application and verified that I am speaking with the correct person using two identifiers. Location patient: home Location provider: work or home office Persons participating in the virtual visit: patient, provider  I discussed the limitations, risks, security and privacy concerns of performing an evaluation and management service by telephone and the availability of in person appointments. I also discussed with the patient that there may be a patient responsible charge related to this service. The patient expressed understanding and agreed to proceed.   HPI:  Patient and I connected via video to discuss suspected poison ivy or poison oak exposure causing itchy and red rash on bilateral forearms.  Patient was doing yard work over the weekend and must have gotten into poison ivy we are oak.  Noticed his forearms started to welt up earlier this week.  Has tried over-the-counter topical steroid creams, but they are not effective.  Patient states he has had poison oak and or ivy before that did require oral steroid treatment to reduce the rash and itching.  Denies any fever or chills.  States 1 of the blistered areas did leak some clear fluid, but otherwise blistered and welt-y areas do not appear to be leaking.  Denies any pus-like discharge   ROS: See pertinent positives and negatives per HPI.  Past Medical History:  Diagnosis Date  .  Complication of anesthesia    migraine  . Diabetes mellitus without complication ([place])   . Hyperlipidemia   . Hypertension   . Migraines     Past Surgical History:  Procedure Laterality Date  . COLONOSCOPY    . EXCISION MASS NECK Left 09/12/2015   Procedure: EXCISION MASS NECK;  Surgeon: Carloyn Manner, MD;  Location: [facility] ORS;  Service: ENT;  Laterality: Left;  . HEMORRHOID SURGERY  2005   [address] Radene Knee     Family History  Problem Relation Age of Onset  . Heart disease Father   . Diabetes Father   . Heart attack Father   . Stroke Mother   . Heart disease Maternal Grandfather   . Heart attack Maternal Grandfather   . Heart disease Paternal Grandfather   . Heart attack Paternal Grandfather    Social History   Tobacco Use  . Smoking status: Never Smoker  . Smokeless tobacco: Never Used  Substance Use Topics  . Alcohol use: No    Alcohol/week: 0.0 standard drinks    Current Outpatient Medications:  .  amLODipine (NORVASC) 10 MG tablet, Take 0.5 tablets (5 mg total) by mouth daily., Disp: 90 tablet, Rfl: 1 .  aspirin EC 81 MG tablet, Take 81 mg by mouth daily., Disp: , Rfl:  .  atorvastatin (LIPITOR) 10 MG tablet, Take 1 tablet (10 mg total) by mouth daily., Disp: 90 tablet, Rfl: 1 .  blood glucose meter kit and supplies, Dispense based on patient and insurance preference. Use up to four times daily  as directed. (FOR ICD-9 250.00, 250.01)., Disp: 1 each, Rfl: 0 .  butalbital-acetaminophen-caffeine (FIORICET, ESGIC) 50-325-40 MG tablet, TAKE 1 TABLET BY MOUTH  EVERY 6 HOURS AS NEEDED FOR HEADACHE, Disp: 30 tablet, Rfl: 0 .  empagliflozin (JARDIANCE) 10 MG TABS tablet, Take 10 mg by mouth daily., Disp: 90 tablet, Rfl: 1 .  gabapentin (NEURONTIN) 300 MG capsule, Take 1 capsule (300 mg total) by mouth 3 (three) times daily., Disp: 270 capsule, Rfl: 1 .  glimepiride (AMARYL) 4 MG tablet, Take 0.5 tablets (2 mg total) by mouth daily with breakfast., Disp: 15 tablet, Rfl: 3 .   glucose blood test strip, CHECK BLOOD SUGAR 3 TIMES  DAILY, Disp: 300 each, Rfl: 3 .  insulin glargine (LANTUS) 100 unit/mL SOPN, Inject 0.2 mLs (20 Units total) into the skin daily., Disp: 15 mL, Rfl: 1 .  Insulin Pen Needle 29G X 12.7MM MISC, Use to inject insulin daily, Disp: 100 each, Rfl: 1 .  lisinopril (PRINIVIL,ZESTRIL) 40 MG tablet, Take 1 tablet (40 mg total) by mouth daily., Disp: 90 tablet, Rfl: 2 .  metFORMIN (GLUCOPHAGE-XR) 500 MG 24 hr tablet, Take 4 tablets (2,000 mg total) by mouth daily with breakfast., Disp: 360 tablet, Rfl: 3 .  ONETOUCH DELICA LANCETS 10C MISC, Check blood glucose three times daily DX: E11.65, Disp: 300 each, Rfl: 1 .  propranolol ER (INDERAL LA) 60 MG 24 hr capsule, Take 1 capsule (60 mg total) by mouth daily., Disp: 90 capsule, Rfl: 3 .  Vitamin D, Ergocalciferol, 2000 units CAPS, Take by mouth daily., Disp: , Rfl:  .  famotidine (PEPCID) 40 MG tablet, Take 1 tablet (40 mg total) by mouth at bedtime for 14 days., Disp: 14 tablet, Rfl: 0 .  fexofenadine (ALLEGRA ALLERGY) 180 MG tablet, Take 1 tablet (180 mg total) by mouth daily., Disp: 30 tablet, Rfl: 2 .  predniSONE (STERAPRED UNI-PAK 21 TAB) 10 MG (21) TBPK tablet, Take according to pack instructions, Disp: 21 tablet, Rfl: 0  EXAM:  GENERAL: alert, oriented, appears well and in no acute distress  HEENT: atraumatic, conjunttiva clear, no obvious abnormalities on inspection of external nose and ears  NECK: normal movements of the head and neck  LUNGS: on inspection no signs of respiratory distress, breathing rate appears normal, no obvious gross SOB, gasping or wheezing  CV: no obvious cyanosis  MS: moves all visible extremities without noticeable abnormality  SKIN: Red raised and blistered rash areas on bilateral forearms that does appear consistent with exposure to poison ivy or poison oak.  PSYCH/NEURO: pleasant and cooperative, no obvious depression or anxiety, speech and thought processing  grossly intact  ASSESSMENT AND PLAN:  Discussed the following assessment and plan:  Contact dermatitis related to poison ivy or poison oak- patient will take oral steroid taper and will also create histamine blockade using Allegra in the a.m. and Pepcid in the p.m.  Advised he can use topical hydrocortisone and or calamine lotion on skin to help calm itch.  Encourage patient to avoid scratching, itching and picking at skin due to risk of possible development of cellulitis skin infection.  Also advised patient that due to him being diabetic prednisone can cause blood sugars to be elevated, advised to keep close eye on blood sugar levels while on prednisone course.  Patient instructed to call office right away and or go to emergency room if blood sugars start to go over 400.  Also advised to call office right away if started noticed signs of skin infection.  I discussed the assessment and treatment plan with the patient. The patient was provided an opportunity to ask questions and all were answered. The patient agreed with the plan and demonstrated an understanding of the instructions.   The patient was advised to call back or seek an in-person evaluation if the symptoms worsen or if the condition fails to improve as anticipated.   Jodelle Green, FNP

## 2019-04-14 NOTE — Telephone Encounter (Signed)
Tried reach patient by phone to schedule but no answer and mailbox is full.

## 2019-04-14 NOTE — Telephone Encounter (Signed)
Pt called [name] famotidine (PEPCID) 40 MG tablet,fexofenadine (ALLEGRA ALLERGY) 180 MG tablet ,predniSONE (STERAPRED UNI-PAK 21 TAB) 10 MG (21) TBPK tablet (Patient needs a callback and pharmacy needs a callback as well. Patient stated that pharmacy stated it was too soon for him to get his medications however the medication sent over were new meds for patient today. Called Pt back and told him his Prednisone was available at [facility] and [name] and his Pepcid was not available it was on back order

## 2019-04-14 NOTE — Patient Instructions (Addendum)
Take the steroid taper dose for today, Allegra allergy 180 mg tablet and Pepcid today when you pick up at pharmacy.  Tomorrow AM -- take next steroid taper dose and Allegra 180 mg table. Then take the Pepcid dose in the PM.  Avoid scratching. If skin starts to look worse or like its getting infected --- let us know.     Poison Ivy/Oak Dermatitis Poison ivy dermatitis is redness and soreness of the skin caused by chemicals in the leaves of the poison ivy plant. You may have very bad itching, swelling, a rash, and blisters. What are the causes?  Touching a poison ivy plant.  Touching something that has the chemical on it. This may include animals or objects that have come in contact with the plant. What increases the risk?  Going outdoors often in wooded or [place] areas.  Going outdoors without wearing protective clothing, such as closed shoes, long pants, and a long-sleeved shirt. What are the signs or symptoms?   Skin redness.  Very bad itching.  A rash that often includes bumps and blisters. ? The rash usually appears 48 hours after exposure, if you have been exposed before. ? If this is the first time you have been exposed, the rash may not appear until a week after exposure.  Swelling. This may occur if the reaction is very bad. Symptoms usually last for 1-2 weeks. The first time you develop this condition, symptoms may last 3-4 weeks. How is this treated? This condition may be treated with:  Hydrocortisone cream or calamine lotion to relieve itching.  Oatmeal baths to soothe the skin.  Medicines, such as over-the-counter antihistamine tablets.  Oral steroid medicine for more severe reactions. Follow these instructions at home: Medicines  Take or apply over-the-counter and prescription medicines only as told by your doctor.  Use hydrocortisone cream or calamine lotion as needed to help with itching. General instructions  Do not scratch or rub your skin.  Put a  cold, wet cloth (cold compress) on the affected areas or take baths in cool water. This will help with itching.  Avoid hot baths and showers.  Take oatmeal baths as needed. Use colloidal oatmeal. You can get this at a pharmacy or grocery store. Follow the instructions on the package.  While you have the rash, wash your clothes right after you wear them.  Keep all follow-up visits as told by your health care provider. This is important. How is this prevented?   Know what poison ivy looks like, so you can avoid it. ? This plant has three leaves with flowering branches on a single stem. ? The leaves are glossy. ? The leaves have uneven edges that come to a point at the front.  If you touch poison ivy, wash your skin with soap and water right away. Be sure to wash under your fingernails.  When hiking or camping, wear long pants, a long-sleeved shirt, tall socks, and hiking boots. You can also use a lotion on your skin that helps to prevent contact with poison ivy.  If you think that your clothes or outdoor gear came in contact with poison ivy, rinse them off with a garden hose before you bring them inside your house.  When doing yard work or gardening, wear gloves, long sleeves, long pants, and boots. Wash your garden tools and gloves if they come in contact with poison ivy.  If you think that your pet has come into contact with poison ivy, wash him or her  with pet shampoo and water. Make sure to wear gloves while washing your pet. Contact a doctor if:  You have open sores in the rash area.  You have more redness, swelling, or pain in the rash area.  You have redness that spreads beyond the rash area.  You have fluid, blood, or pus coming from the rash area.  You have a fever.  You have a rash over a large area of your body.  You have a rash on your eyes, mouth, or genitals.  Your rash does not get better after a few weeks. Get help right away if:  Your face swells or your  eyes swell shut.  You have trouble breathing.  You have trouble swallowing. These symptoms may be an emergency. Do not wait to see if the symptoms will go away. Get medical help right away. Call your local emergency services (911 in the [place] Do not drive yourself to the hospital. Summary  Poison ivy dermatitis is redness and soreness of the skin caused by chemicals in the leaves of the poison ivy plant.  You may have skin redness, very bad itching, swelling, and a rash.  Do not scratch or rub your skin.  Take or apply over-the-counter and prescription medicines only as told by your doctor. This information is not intended to replace advice given to you by your health care provider. Make sure you discuss any questions you have with your health care provider. Document Released: 09/26/2010 Document Revised: 12/16/2018 Document Reviewed: 08/19/2018 Elsevier Patient Education  2020 [employer].

## 2019-04-26 ENCOUNTER — Encounter: Payer: Self-pay | Admitting: Family Medicine

## 2019-04-27 MED ORDER — GABAPENTIN 300 MG PO CAPS: 300.0000 mg | ORAL_CAPSULE | Freq: Three times a day (TID) | ORAL | 1 refills | Status: DC

## 2019-05-24 ENCOUNTER — Encounter: Payer: Self-pay | Admitting: Family Medicine

## 2019-05-24 MED ORDER — METFORMIN HCL ER 500 MG PO TB24: 2000.0000 mg | ORAL_TABLET | Freq: Every day | ORAL | 0 refills | Status: DC

## 2019-05-30 ENCOUNTER — Other Ambulatory Visit: Payer: Self-pay | Admitting: Family Medicine

## 2019-05-30 DIAGNOSIS — L237 Allergic contact dermatitis due to plants, except food: Secondary | ICD-10-CM

## 2019-06-12 ENCOUNTER — Other Ambulatory Visit: Payer: Self-pay

## 2019-06-14 ENCOUNTER — Encounter: Payer: Self-pay | Admitting: Family Medicine

## 2019-06-14 ENCOUNTER — Other Ambulatory Visit: Payer: Self-pay

## 2019-06-14 ENCOUNTER — Ambulatory Visit (INDEPENDENT_AMBULATORY_CARE_PROVIDER_SITE_OTHER): Payer: 59 | Admitting: Family Medicine

## 2019-06-14 VITALS — BP 100/60 | HR 68 | Temp 97.3°F | Ht 70.0 in | Wt 189.6 lb

## 2019-06-14 DIAGNOSIS — E1165 Type 2 diabetes mellitus with hyperglycemia: Secondary | ICD-10-CM

## 2019-06-14 DIAGNOSIS — E782 Mixed hyperlipidemia: Secondary | ICD-10-CM

## 2019-06-14 DIAGNOSIS — E538 Deficiency of other specified B group vitamins: Secondary | ICD-10-CM

## 2019-06-14 DIAGNOSIS — I10 Essential (primary) hypertension: Secondary | ICD-10-CM

## 2019-06-14 DIAGNOSIS — E559 Vitamin D deficiency, unspecified: Secondary | ICD-10-CM | POA: Diagnosis not present

## 2019-06-14 LAB — LIPID PANEL
Cholesterol: 102 mg/dL (ref 0–200)
HDL: 28.3 mg/dL — ABNORMAL LOW (ref >39.00)
LDL Cholesterol: 52 mg/dL (ref 0–99)
NonHDL: 73.24
Total CHOL/HDL Ratio: 4
Triglycerides: 107 mg/dL (ref 0.0–149.0)
VLDL: 21.4 mg/dL (ref 0.0–40.0)

## 2019-06-14 LAB — VITAMIN D 25 HYDROXY (VIT D DEFICIENCY, FRACTURES): VITD: 49.12 ng/mL (ref 30.00–100.00)

## 2019-06-14 LAB — COMPREHENSIVE METABOLIC PANEL
ALT: 23 U/L (ref 0–53)
AST: 21 U/L (ref 0–37)
Albumin: 4.3 g/dL (ref 3.5–5.2)
Alkaline Phosphatase: 91 U/L (ref 39–117)
BUN: 16 mg/dL (ref 6–23)
CO2: 23 mEq/L (ref 19–32)
Calcium: 9.5 mg/dL (ref 8.4–10.5)
Chloride: 104 mEq/L (ref 96–112)
Creatinine, Ser: 1.3 mg/dL (ref 0.40–1.50)
GFR: 56.24 mL/min — ABNORMAL LOW (ref >60.00)
Glucose, Bld: 149 mg/dL — ABNORMAL HIGH (ref 70–99)
Potassium: 4.2 mEq/L (ref 3.5–5.1)
Sodium: 138 mEq/L (ref 135–145)
Total Bilirubin: 0.8 mg/dL (ref 0.2–1.2)
Total Protein: 7.2 g/dL (ref 6.0–8.3)

## 2019-06-14 LAB — VITAMIN B12: Vitamin B-12: 278 pg/mL (ref 211–911)

## 2019-06-14 LAB — HEMOGLOBIN A1C: Hgb A1c MFr Bld: 11 % — ABNORMAL HIGH (ref 4.6–6.5)

## 2019-06-14 NOTE — Assessment & Plan Note (Signed)
Well-controlled.  Continue current regimen.  Check lab work. 

## 2019-06-14 NOTE — Assessment & Plan Note (Signed)
Check lipid panel.  Continue Lipitor. 

## 2019-06-14 NOTE — Assessment & Plan Note (Signed)
Check vitamin D.  Continue vitamin D supplement.

## 2019-06-14 NOTE — Assessment & Plan Note (Signed)
Recheck B12 and if still low we will restart supplementation with injections.

## 2019-06-14 NOTE — Patient Instructions (Signed)
Nice to see you. We will check lab work today and contact you with the results. 

## 2019-06-14 NOTE — Assessment & Plan Note (Signed)
Uncontrolled.  He will continue his current regimen for now and will check an A1c.  I discussed the possibility of adding on an additional medication such as Jardiance.

## 2019-06-14 NOTE — Progress Notes (Signed)
  [name], MD Phone: [phone]  Jonathan West is a 57 y.o. male who presents today for follow-up.  HYPERTENSION Disease Monitoring: Blood pressure range-110/65 Chest pain- no      Dyspnea- no Medications: Compliance- taking propranolol, lisinopril, amlodipine lightheadedness- no   Edema- no  DIABETES Disease Monitoring: Blood Sugar ranges-averaging 220-250 with excursions up to 300, occasionally down into the 100s polyuria/phagia/dipsia-patient does note some polydipsia and polyuria      Optho-up-to-date Medications: Compliance-taking glimepiride and metformin.  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  He notes this is been a great change for him and is much less stressful.  B12 deficiency: He has not been on a B12 supplement.  Vitamin D deficiency: Patient is taking vitamin D.   Social History   Tobacco Use  Smoking Status Never Smoker  Smokeless Tobacco Never Used     ROS see history of present illness  Objective  Physical Exam Vitals:   06/14/19 1114  BP: 100/60  Pulse: 68  Temp: (!) 97.3 F (36.3 C)  SpO2: 99%    BP Readings from Last 3 Encounters:  06/14/19 100/60  07/04/18 109/71  06/13/18 109/70   Wt Readings from Last 3 Encounters:  06/14/19 189 lb 9.6 oz (86 kg)  07/04/18 188 lb 6.4 oz (85.5 kg)  06/13/18 188 lb (85.3 kg)    Physical Exam Constitutional:      General: He is not in acute distress.    Appearance: He is not diaphoretic.  Cardiovascular:     Rate and Rhythm: Normal rate and regular rhythm.     Heart sounds: Normal heart sounds.  Pulmonary:     Effort: Pulmonary effort is normal.     Breath sounds: Normal breath sounds.  Musculoskeletal:     Right lower leg: No edema.    Left lower leg: No edema.  Skin:    General: Skin is warm and dry.  Neurological:     Mental Status: He is alert.    Diabetic Foot Exam - Simple   Simple Foot Form Diabetic Foot exam was performed with the following findings: Yes 06/14/2019 11:27 AM  Visual Inspection No deformities, no ulcerations, no other skin breakdown bilaterally: Yes Sensation Testing Intact to touch and monofilament testing bilaterally: Yes Pulse Check Posterior Tibialis and Dorsalis pulse intact bilaterally: Yes Comments      Assessment/Plan: Please see individual problem list.  Essential hypertension Well-controlled.  Continue current regimen.  Check lab work.  DM (diabetes mellitus), type 2, uncontrolled Uncontrolled.  He will continue his current regimen for now and will check an A1c.  I discussed the possibility of adding on an additional medication such as Jardiance.  B12 deficiency Recheck B12 and if still low we will restart supplementation with injections.  Hyperlipidemia Check lipid panel.  Continue Lipitor.  Vitamin D deficiency Check vitamin D.  Continue vitamin D supplement.   Orders Placed This Encounter  Procedures  . Comp Met (CMET)  . HgB A1c  . Lipid panel  . B12  . Vitamin D (25 hydroxy)    No orders of the defined types were placed in this encounter.    [name], MD [place]

## 2019-06-18 ENCOUNTER — Other Ambulatory Visit: Payer: Self-pay | Admitting: Family Medicine

## 2019-06-18 MED ORDER — JARDIANCE 10 MG PO TABS: 10.0000 mg | ORAL_TABLET | Freq: Every day | ORAL | 1 refills | Status: DC

## 2019-06-21 ENCOUNTER — Encounter: Payer: Self-pay | Admitting: Family Medicine

## 2019-06-21 DIAGNOSIS — I10 Essential (primary) hypertension: Secondary | ICD-10-CM

## 2019-06-22 ENCOUNTER — Telehealth: Payer: Self-pay

## 2019-06-22 MED ORDER — ATORVASTATIN CALCIUM 10 MG PO TABS: 10.0000 mg | ORAL_TABLET | Freq: Every day | ORAL | 1 refills | Status: DC

## 2019-06-22 NOTE — Telephone Encounter (Signed)
-----   Message from Leone Haven, MD sent at 06/18/2019  9:16 AM EDT ----- Vania Rea sent to pharmacy. Please see if we can have him follow-up in about 3 to 4 weeks on his diabetes.  We will need to see what his sugars are doing and then we can consider adding medication or increasing medication.  The B12 injection should be 1000 mcg once weekly for 3 weeks and then once monthly.  Thanks.

## 2019-06-23 ENCOUNTER — Encounter: Payer: Self-pay | Admitting: Family Medicine

## 2019-06-23 MED ORDER — JARDIANCE 10 MG PO TABS: 10.0000 mg | ORAL_TABLET | Freq: Every day | ORAL | 1 refills | Status: DC

## 2019-06-25 ENCOUNTER — Encounter: Payer: Self-pay | Admitting: Family Medicine

## 2019-06-28 ENCOUNTER — Telehealth: Payer: Self-pay

## 2019-06-28 ENCOUNTER — Ambulatory Visit (INDEPENDENT_AMBULATORY_CARE_PROVIDER_SITE_OTHER): Payer: 59

## 2019-06-28 ENCOUNTER — Other Ambulatory Visit: Payer: Self-pay

## 2019-06-28 DIAGNOSIS — E538 Deficiency of other specified B group vitamins: Secondary | ICD-10-CM

## 2019-06-28 MED ORDER — CYANOCOBALAMIN 1000 MCG/ML IJ SOLN
1000.0000 ug | Freq: Once | INTRAMUSCULAR | Status: AC
  Administered 2019-06-28: 1000 ug via INTRAMUSCULAR

## 2019-06-28 NOTE — Progress Notes (Addendum)
Patient came in today for B12 injection. Given in L deltoid. Tolerated well. No complaints or concerns at this time.

## 2019-06-28 NOTE — Progress Notes (Signed)
I have reviewed the above note and agree.  Madge Therrien, M.D.  

## 2019-06-28 NOTE — Telephone Encounter (Signed)
He is on glimeperide and metformin. I do not think the miglitol is an appropriate option. Did they give an option for a PA? If not he should contact his insurance company to see if they can provide a list of preferred medications.

## 2019-07-05 ENCOUNTER — Ambulatory Visit (INDEPENDENT_AMBULATORY_CARE_PROVIDER_SITE_OTHER): Payer: 59

## 2019-07-05 ENCOUNTER — Other Ambulatory Visit: Payer: Self-pay

## 2019-07-05 DIAGNOSIS — E538 Deficiency of other specified B group vitamins: Secondary | ICD-10-CM

## 2019-07-05 MED ORDER — CYANOCOBALAMIN 1000 MCG/ML IJ SOLN
1000.0000 ug | Freq: Once | INTRAMUSCULAR | Status: AC
  Administered 2019-07-05: 16:00:00 1000 ug via INTRAMUSCULAR

## 2019-07-05 NOTE — Progress Notes (Signed)
Patient presented today for B12 injection.  Administered IM in right deltoid.  Patient tolerated well with no signs of distress.   

## 2019-07-11 ENCOUNTER — Ambulatory Visit: Payer: 59

## 2019-07-14 ENCOUNTER — Ambulatory Visit (INDEPENDENT_AMBULATORY_CARE_PROVIDER_SITE_OTHER): Payer: 59

## 2019-07-14 ENCOUNTER — Other Ambulatory Visit: Payer: Self-pay

## 2019-07-14 VITALS — Ht 70.0 in | Wt 189.0 lb

## 2019-07-14 DIAGNOSIS — E538 Deficiency of other specified B group vitamins: Secondary | ICD-10-CM | POA: Diagnosis not present

## 2019-07-14 MED ORDER — CYANOCOBALAMIN 1000 MCG/ML IJ SOLN
1000.0000 ug | Freq: Once | INTRAMUSCULAR | Status: AC
  Administered 2019-07-14: 1000 ug via INTRAMUSCULAR

## 2019-07-14 NOTE — Progress Notes (Signed)
Patient came in today for B-12 injection in left deltoid. Patient tolerated well.

## 2019-07-14 NOTE — Progress Notes (Signed)
I have reviewed the above note and agree.  Eric Sonnenberg, M.D.  

## 2019-08-04 DIAGNOSIS — U071 COVID-19: Secondary | ICD-10-CM | POA: Diagnosis not present

## 2019-08-04 DIAGNOSIS — Z20828 Contact with and (suspected) exposure to other viral communicable diseases: Secondary | ICD-10-CM | POA: Diagnosis not present

## 2019-08-13 DIAGNOSIS — R0989 Other specified symptoms and signs involving the circulatory and respiratory systems: Secondary | ICD-10-CM | POA: Diagnosis not present

## 2019-08-13 DIAGNOSIS — U071 COVID-19: Secondary | ICD-10-CM | POA: Diagnosis not present

## 2019-08-13 DIAGNOSIS — R5383 Other fatigue: Secondary | ICD-10-CM | POA: Diagnosis not present

## 2019-08-13 DIAGNOSIS — R05 Cough: Secondary | ICD-10-CM | POA: Diagnosis not present

## 2019-08-14 ENCOUNTER — Ambulatory Visit: Payer: 59

## 2019-08-14 NOTE — Telephone Encounter (Signed)
Patient notified & will let us know if no improvement or worsening symptoms.

## 2019-08-14 NOTE — Telephone Encounter (Signed)
Noted. He needs to remain quarantined given his positive test and continued symptoms. He should remain quarantined until he has been fever free without the use of tylenol or ibuprofen for at least 3 days and he has had at least 3 days of improved symptoms. If he does not start to improve he should let us know. Thanks.

## 2019-08-16 ENCOUNTER — Encounter: Payer: Self-pay | Admitting: Family Medicine

## 2019-08-16 ENCOUNTER — Other Ambulatory Visit: Payer: Self-pay | Admitting: Family Medicine

## 2019-09-18 ENCOUNTER — Encounter: Payer: Self-pay | Admitting: Family Medicine

## 2019-09-18 ENCOUNTER — Ambulatory Visit (INDEPENDENT_AMBULATORY_CARE_PROVIDER_SITE_OTHER): Payer: BC Managed Care – PPO | Admitting: Family Medicine

## 2019-09-18 ENCOUNTER — Other Ambulatory Visit: Payer: Self-pay

## 2019-09-18 VITALS — BP 120/70 | HR 87 | Temp 96.8°F | Ht 70.0 in | Wt 183.6 lb

## 2019-09-18 DIAGNOSIS — G629 Polyneuropathy, unspecified: Secondary | ICD-10-CM

## 2019-09-18 DIAGNOSIS — E782 Mixed hyperlipidemia: Secondary | ICD-10-CM

## 2019-09-18 DIAGNOSIS — E1140 Type 2 diabetes mellitus with diabetic neuropathy, unspecified: Secondary | ICD-10-CM | POA: Insufficient documentation

## 2019-09-18 DIAGNOSIS — I10 Essential (primary) hypertension: Secondary | ICD-10-CM

## 2019-09-18 DIAGNOSIS — E1165 Type 2 diabetes mellitus with hyperglycemia: Secondary | ICD-10-CM | POA: Diagnosis not present

## 2019-09-18 MED ORDER — LISINOPRIL 20 MG PO TABS: 40.0000 mg | ORAL_TABLET | Freq: Every day | ORAL | 1 refills | Status: DC

## 2019-09-18 MED ORDER — GABAPENTIN 300 MG PO CAPS: 600.0000 mg | ORAL_CAPSULE | Freq: Every day | ORAL | 1 refills | Status: DC

## 2019-09-18 MED ORDER — AMLODIPINE BESYLATE 5 MG PO TABS: 5.0000 mg | ORAL_TABLET | Freq: Every day | ORAL | 1 refills | Status: DC

## 2019-09-18 MED ORDER — GLIMEPIRIDE 2 MG PO TABS: 2.0000 mg | ORAL_TABLET | Freq: Every day | ORAL | 1 refills | Status: DC

## 2019-09-18 NOTE — Assessment & Plan Note (Signed)
Uncontrolled.  Check A1c.  Once this returns we will check with his pharmacy regarding possible coverage for an SGLT2 inhibitor.

## 2019-09-18 NOTE — Progress Notes (Signed)
[name], MD Phone: [phone]  GIFFORD ENGE is a 59 y.o. male who presents today for f/u.  HYPERTENSION  Disease Monitoring  Home BP Monitoring similar to today Chest pain- no    Dyspnea- no Medications  Compliance-  Taking amlodipine, lisinopril, propranolol.  Edema- no  DIABETES Disease Monitoring: Blood Sugar ranges-Avg 180, excursions to the 240s Polyuria/phagia/dipsia- no      Optho- due Medications: Compliance- taking glimeperide, metformin Hypoglycemic symptoms- no  HYPERLIPIDEMIA Symptoms Chest pain on exertion:  no    Medications: Compliance- taking lipitor Right upper quadrant pain- no  Muscle aches- no  Neuropathy: Patient does take gabapentin 900 mg once nightly.  He is unsure exactly why he is on this though he does note some chronic intermittent tingling in his feet.  He does have a history of back pain with sciatica though notes that improved quite a bit after his back surgery.       Social History   Tobacco Use  Smoking Status Never Smoker  Smokeless Tobacco Never Used     ROS see history of present illness  Objective  Physical Exam Vitals:   09/18/19 1608  BP: 120/70  Pulse: 87  Temp: (!) 96.8 F (36 C)  SpO2: 98%    BP Readings from Last 3 Encounters:  09/18/19 120/70  06/14/19 100/60  07/04/18 109/71   Wt Readings from Last 3 Encounters:  09/18/19 183 lb 9.6 oz (83.3 kg)  07/14/19 189 lb (85.7 kg)  06/14/19 189 lb 9.6 oz (86 kg)    Physical Exam Constitutional:      General: He is not in acute distress.    Appearance: He is not diaphoretic.  Cardiovascular:     Rate and Rhythm: Normal rate and regular rhythm.     Heart sounds: Normal heart sounds.  Pulmonary:     Effort: Pulmonary effort is normal.     Breath sounds: Normal breath sounds.  Musculoskeletal:     Right lower leg: No edema.     Left lower leg: No edema.  Skin:    General: Skin is warm and dry.  Neurological:     Mental Status: He is alert.       Assessment/Plan: Please see individual problem list.  Essential hypertension Well-controlled.  Continue current regimen.  Check BMP.  DM (diabetes mellitus), type 2, uncontrolled Uncontrolled.  Check A1c.  Once this returns we will check with his pharmacy regarding possible coverage for an SGLT2 inhibitor.  Neuropathy Minimal symptoms.  He will decrease his gabapentin to 600 mg and see if he is getting benefit from the higher dose.  If his symptoms worsen he can bump his dose back up to 900 mg once nightly.  Hyperlipidemia Continue Lipitor.   Health Maintenance: He will call to schedule an eye appointment in the next several months.  Orders Placed This Encounter  Procedures  . Basic Metabolic Panel (BMET)  . HgB A1c    Meds ordered this encounter  Medications  . amLODipine (NORVASC) 5 MG tablet    Sig: Take 1 tablet (5 mg total) by mouth daily.    Dispense:  90 tablet    Refill:  1  . gabapentin (NEURONTIN) 300 MG capsule    Sig: Take 2 capsules (600 mg total) by mouth at bedtime.    Dispense:  180 capsule    Refill:  1  . glimepiride (AMARYL) 2 MG tablet    Sig: Take 1 tablet (2 mg total) by mouth daily  with breakfast.    Dispense:  90 tablet    Refill:  1  . lisinopril (ZESTRIL) 20 MG tablet    Sig: Take 2 tablets (40 mg total) by mouth daily.    Dispense:  180 tablet    Refill:  1    This visit occurred during the SARS-CoV-2 public health emergency.  Safety protocols were in place, including screening questions prior to the visit, additional usage of staff PPE, and extensive cleaning of exam room while observing appropriate contact time as indicated for disinfecting solutions.    [name], MD [place]

## 2019-09-18 NOTE — Assessment & Plan Note (Signed)
Minimal symptoms.  He will decrease his gabapentin to 600 mg and see if he is getting benefit from the higher dose.  If his symptoms worsen he can bump his dose back up to 900 mg once nightly.

## 2019-09-18 NOTE — Assessment & Plan Note (Signed)
Well-controlled.  Continue current regimen.  Check BMP. 

## 2019-09-18 NOTE — Patient Instructions (Signed)
Nice to see you. We are going to get lab work today and contact you with results. You can try decreasing your gabapentin to 600 mg once nightly and see how that goes.  If you have recurrent neuropathy symptoms please go back to 900 mg once nightly.

## 2019-09-18 NOTE — Assessment & Plan Note (Signed)
-  Continue Lipitor °

## 2019-09-19 LAB — BASIC METABOLIC PANEL
BUN: 18 mg/dL (ref 6–23)
CO2: 25 mEq/L (ref 19–32)
Calcium: 9.7 mg/dL (ref 8.4–10.5)
Chloride: 103 mEq/L (ref 96–112)
Creatinine, Ser: 1.26 mg/dL (ref 0.40–1.50)
GFR: 58.25 mL/min — ABNORMAL LOW (ref >60.00)
Glucose, Bld: 314 mg/dL — ABNORMAL HIGH (ref 70–99)
Potassium: 4.9 mEq/L (ref 3.5–5.1)
Sodium: 136 mEq/L (ref 135–145)

## 2019-09-19 LAB — HEMOGLOBIN A1C: Hgb A1c MFr Bld: 10.3 % — ABNORMAL HIGH (ref 4.6–6.5)

## 2019-09-20 ENCOUNTER — Telehealth: Payer: Self-pay

## 2019-09-20 NOTE — Telephone Encounter (Signed)
Called and left a message on voicemail about receiving his colonoscopy results and he wlll not need another colonoscopy til 2026.  Valla Pacey,cma

## 2019-09-20 NOTE — Telephone Encounter (Signed)
Noted.  Reviewed.  Last colonoscopy was 10/09/2014 with no polyps.  10-year recall.  This has been put into the system.  Please let the patient know.

## 2019-09-25 ENCOUNTER — Telehealth: Payer: Self-pay

## 2019-09-25 ENCOUNTER — Other Ambulatory Visit: Payer: BC Managed Care – PPO

## 2019-09-25 ENCOUNTER — Other Ambulatory Visit: Payer: Self-pay

## 2019-09-25 DIAGNOSIS — E1165 Type 2 diabetes mellitus with hyperglycemia: Secondary | ICD-10-CM

## 2019-09-25 DIAGNOSIS — N1831 Chronic kidney disease, stage 3a: Secondary | ICD-10-CM

## 2019-09-25 NOTE — Addendum Note (Signed)
Addended by: Leeanne Rio on: 09/25/2019 03:53 PM   Modules accepted: Orders

## 2019-09-25 NOTE — Telephone Encounter (Signed)
-----   Message from Leone Haven, MD sent at 09/24/2019 10:57 AM EST ----- Please let the patient know that his A1c is uncontrolled. Can you call his pharmacy to see if jardiance is covered under his plan? If it is not can you see if they can tell us which medications in that class are covered? His kidney function is relatively stable. It now meets criteria for chronic kidney disease stage 3. This is likely related to his diabetes and hypertension. I would like to complete urine protein testing and an [place] to look at his kidney structure. I can order these if he is willing. Thanks.

## 2019-09-26 LAB — PROTEIN / CREATININE RATIO, URINE
Creatinine, Urine: 91.9 mg/dL
Protein, Ur: 7.9 mg/dL
Protein/Creat Ratio: 86 mg/g creat (ref 0–200)

## 2019-09-26 NOTE — Telephone Encounter (Signed)
[facility] states there is an alternative but it is more expensive than jardiance.  Jonathan West,cma

## 2019-09-26 NOTE — Addendum Note (Signed)
Addended by: Leone Haven on: 09/26/2019 04:27 PM   Modules accepted: Orders

## 2019-09-26 NOTE — Telephone Encounter (Signed)
Order placed. Were you able to determine any alternatives for Jardiance.

## 2019-09-27 NOTE — Telephone Encounter (Signed)
Noted. Please see if he is ok seeing Catie to help with his diabetes medications.

## 2019-09-28 ENCOUNTER — Telehealth: Payer: Self-pay

## 2019-09-28 NOTE — Addendum Note (Signed)
Addended by: Caryl Bis, Iszabella Hebenstreit G on: 09/28/2019 10:04 AM   Modules accepted: Orders

## 2019-09-28 NOTE — Telephone Encounter (Signed)
I called and spoke to the patient and he is willing to see Catie.  Hope Brandenburger,cma

## 2019-09-28 NOTE — Telephone Encounter (Signed)
Referral placed.

## 2019-10-02 ENCOUNTER — Telehealth: Payer: Self-pay | Admitting: Family Medicine

## 2019-10-02 NOTE — Chronic Care Management (AMB) (Signed)
  Care Management   Outreach Note  10/02/2019 Name: Jonathan West MRN: [id number] DOB: [birth year]  Referred by: Leone Haven, MD Reason for referral : Chronic Care Management (CM Initial outreach unsuccessful)   An unsuccessful telephone outreach was attempted today. The patient was referred to the case management team by for assistance with care management and care coordination.   Follow Up Plan: A HIPPA compliant phone message was left for the patient providing contact information and requesting a return call.  The care management team will reach out to the patient again over the next 7 days.  If patient returns call to provider office, please advise to call Embedded Care Management Care Guide Glenna Durand LPN at [phone]  Tristen Pennino, LPN Health Advisor, [place] Management ??[email or web address] ??[phone]

## 2019-10-09 NOTE — Chronic Care Management (AMB) (Signed)
  Care Management   Note  10/09/2019 Name: Jonathan West MRN: [id number] DOB: 05-Feb-1961  Jonathan West is a 59 y.o. year old male who is a primary care patient of Caryl Bis, Angela Adam, MD. I reached out to Jonathan West by phone today in response to a referral sent by [name]'s health plan.    [name] was given information about care management services today including:  1. Care management services include personalized support from designated clinical staff supervised by his physician, including individualized plan of care and coordination with other care providers 2. 24/7 contact phone numbers for assistance for urgent and routine care needs. 3. The patient may stop care management services at any time by phone call to the office staff.  Patient agreed to services and verbal consent obtained.   Follow up plan: Telephone appointment with care management team member scheduled for:11/09/2019  Glenna Durand, LPN Health Advisor, [place] Management ??[email or web address] ??[phone]

## 2019-10-11 ENCOUNTER — Ambulatory Visit
Admission: RE | Admit: 2019-10-11 | Discharge: 2019-10-11 | Disposition: A | Discharge status: Home / Self Care | Payer: BC Managed Care – PPO | Source: Ambulatory Visit | Attending: Family Medicine | Admitting: Family Medicine

## 2019-10-11 ENCOUNTER — Other Ambulatory Visit: Payer: Self-pay

## 2019-10-11 DIAGNOSIS — N1831 Chronic kidney disease, stage 3a: Secondary | ICD-10-CM | POA: Diagnosis not present

## 2019-10-11 DIAGNOSIS — N183 Chronic kidney disease, stage 3 unspecified: Secondary | ICD-10-CM | POA: Diagnosis not present

## 2019-10-11 DIAGNOSIS — N281 Cyst of kidney, acquired: Secondary | ICD-10-CM | POA: Diagnosis not present

## 2019-10-26 ENCOUNTER — Telehealth: Payer: Self-pay | Admitting: *Deleted

## 2019-10-26 NOTE — Telephone Encounter (Signed)
Does not need any labs. Urine was previously collected. Please call him and cancel his lab appointment.

## 2019-10-26 NOTE — Telephone Encounter (Signed)
Please place future orders for lab appt.  

## 2019-10-27 ENCOUNTER — Other Ambulatory Visit: Payer: BC Managed Care – PPO

## 2019-10-27 NOTE — Telephone Encounter (Signed)
I called and spoke with the patient and informed him that he did not need labs and his appt was cancelled per the provider.  Frutoso Dimare,cma

## 2019-11-04 NOTE — Telephone Encounter (Signed)
Message was ent to the provider.  Tamaira Ciriello,cma

## 2019-11-09 ENCOUNTER — Ambulatory Visit: Payer: Self-pay | Admitting: Pharmacist

## 2019-11-09 ENCOUNTER — Ambulatory Visit: Payer: BC Managed Care – PPO | Admitting: Pharmacist

## 2019-11-09 ENCOUNTER — Encounter: Payer: Self-pay | Admitting: Pharmacist

## 2019-11-09 DIAGNOSIS — E1165 Type 2 diabetes mellitus with hyperglycemia: Secondary | ICD-10-CM

## 2019-11-09 MED ORDER — STEGLATRO 5 MG PO TABS: 5.0000 mg | ORAL_TABLET | Freq: Every day | ORAL | 2 refills | Status: DC

## 2019-11-09 MED ORDER — INSULIN PEN NEEDLE 29G X 12.7MM MISC: 3 refills | Status: DC

## 2019-11-09 MED ORDER — BASAGLAR KWIKPEN 100 UNIT/ML ~~LOC~~ SOPN: PEN_INJECTOR | SUBCUTANEOUS | 3 refills | Status: DC

## 2019-11-09 NOTE — Chronic Care Management (AMB) (Signed)
**Note Jonathan-Identified via Obfuscation** Chronic Care Management   Note  11/09/2019 Name: Jonathan West MRN: [id number] DOB: [birth year]   Subjective:  Jonathan West is a 59 y.o. year old male who is a primary care patient of Jonathan West, Jonathan Adam, MD. The CCM team was consulted for assistance with chronic disease management and care coordination needs.     Contacted patient for medication management review as scheduled.   Review of patient status, including review of consultants reports, laboratory and other test data, was performed as part of comprehensive evaluation and provision of chronic care management services.   SDOH (Social Determinants of Health) assessments and interventions performed:  SDOH Interventions     Most Recent Value  SDOH Interventions  SDOH Interventions for the Following Domains  Financial Strain  Financial Strain Interventions  Other (Comment) [copay cards and insulin savings programs]       Objective:  Lab Results  Component Value Date   CREATININE 1.26 09/18/2019   CREATININE 1.30 06/14/2019   CREATININE 1.20 06/13/2018    Lab Results  Component Value Date   HGBA1C 10.3 (H) 09/18/2019       Component Value Date/Time   CHOL 102 06/14/2019 1134   TRIG 107.0 06/14/2019 1134   HDL 28.30 (L) 06/14/2019 1134   CHOLHDL 4 06/14/2019 1134   VLDL 21.4 06/14/2019 1134   LDLCALC 52 06/14/2019 1134   LDLDIRECT 58.0 09/13/2017 1232    Clinical ASCVD: No  The ASCVD Risk score (Jonathan DC Jr., et al., 2013) failed to calculate for the following reasons:   The valid total cholesterol range is 130 to 320 mg/dL    BP Readings from Last 3 Encounters:  09/18/19 120/70  06/14/19 100/60  07/04/18 109/71    Allergies  Allergen Reactions  . Metformin Other (See Comments)    Indigestion with immediate release    Medications Reviewed Today    Reviewed by Jonathan West, [facility] (Pharmacist) on 11/09/19 at [place] List Status: <None>  Medication Order Taking? Sig Documenting Provider Last  Dose Status Informant  amLODipine (NORVASC) 5 MG tablet [id number] Yes Take 1 tablet (5 mg total) by mouth daily. Jonathan Haven, MD Taking Active   aspirin EC 81 MG tablet [id number] Yes Take 81 mg by mouth daily. [provider] Taking Active   atorvastatin (LIPITOR) 10 MG tablet [id number] Yes Take 1 tablet (10 mg total) by mouth daily. Jonathan Haven, MD Taking Active   blood glucose meter kit and supplies [id number] Yes Dispense based on patient and insurance preference. Use up to four times daily as directed. (FOR ICD-9 250.00, 250.01). Jonathan Haven, MD Taking Active         Discontinued 11/09/19 1552 (Completed Course)   gabapentin (NEURONTIN) 300 MG capsule [id number] Yes Take 2 capsules (600 mg total) by mouth at bedtime. Jonathan Haven, MD Taking Active   glimepiride (AMARYL) 2 MG tablet [id number] Yes Take 1 tablet (2 mg total) by mouth daily with breakfast. Jonathan Haven, MD Taking Active   glucose blood test strip [id number] Yes CHECK BLOOD SUGAR 3 TIMES  DAILY Jonathan Haven, MD Taking Active   insulin NPH Human (NOVOLIN N) 100 UNIT/ML injection [id number] Yes Inject 32 Units into the skin at bedtime. [provider] Taking Active   Insulin Pen Needle 29G X 12.7MM [place] [id number] Yes Use to inject insulin daily Jonathan Haven, MD Taking Active   lisinopril (ZESTRIL) 20 MG tablet [id number] Yes Take 2 tablets (40 mg  total) by mouth daily. Jonathan Haven, MD Taking Active   metFORMIN (GLUCOPHAGE-XR) 500 MG 24 hr tablet [id number] Yes Take 4 tablets (2,000 mg total) by mouth daily with breakfast. Jonathan Haven, MD Taking Active   [facility] LANCETS 23J MISC [id number] Yes Check blood glucose three times daily DX: E11.65 Jonathan Haven, MD Taking Active        Patient not taking:      Discontinued 11/09/19 1552 (Completed Course)   propranolol ER (INDERAL LA) 60 MG 24 hr capsule [id number] Yes Take 1 capsule (60 mg total) by mouth  daily. Jonathan Haven, MD Taking Active   Vitamin D, Ergocalciferol, 2000 units CAPS [id number] Yes Take by mouth daily. [provider] Taking Active            Assessment:   Goals Addressed            This Visit's Progress     Patient Stated   . "I can't afford this medication" (pt-stated)       CARE PLAN ENTRY (see longtitudinal plan of care for additional care plan information)  Current Barriers:  . Diabetes: uncontrolled, complicated by chronic medical conditions including HLD, HTN, neuropathy, most recent A1c 10.3% . Most recent eGFR: ~58 mL/min . Current antihyperglycemic regimen: metformin XR 2000 mg daily, insulin NPH 32 units QPM, glimepiride 2 mg QAM . Current blood glucose readings: o Fastings: 209; upper 150-210s o Not checking post prandial . Cardiovascular risk reduction: o Current hypertensive regimen:  o amlodipine 5 mg daily, lisinopril 20 mg daily, propranolol ER 60 mg  o Current hyperlipidemia regimen: atorvastatin 10 mg daily  o Current antiplatelet regimen: ASA 81 mg daily  Pharmacist Clinical Goal(s):  Marland Kitchen Over the next 90 days, patient will work with PharmD and primary care provider to address medication optimization  Interventions: . Comprehensive medication review performed, medication list updated in electronic medical record . Contacted BCBS. Patient has an individual medical + pharmacy deductible of $8150/year. He has met $1417 thus far.  . Reviewed coverage: Jardiance and Wilder Glade are preferred, but cost is >$550 on his insurance, since he has not met his deductible yet. These two copay cards take of <$200 per month on a prescription, so cost would still be unaffordable. However, Steglatro copay card advertises taking off up to $583. Unsure if this full benefit can be applied to commercial patients, but we will try. Counseled patient on how to sign up for copay card, and encouraged he take to the pharmacy. Start Steglatro 5 mg once  daily.  . Regarding insulin therapy, Lilly advertises a max of $35/month for their products for commercially insured patients. Unsure if this will apply for patients whose insurance does not cover the medication. If able, start Basaglar 40 units once daily. Will provide titration instructions in future, pending affordability. Reviewed improved pharmacokinetic profile of Basaglar vs insulin NPH  . For now, continue glimepiride. Will evaluate need in the future if above solutions work. . Reviewed goal A1c, goal fasting glucose, goal post prandial glucose  Patient Self Care Activities:  . Patient will check blood glucose daily, document, and provide at future appointments . Patient will take medications as prescribed . Patient will report any questions or concerns to provider   Initial goal documentation        Plan: - Will await call from patient with above solutions  [name], PharmD, [place], [place] Pharmacist [place] [phone]

## 2019-11-09 NOTE — Patient Instructions (Addendum)
Visit Information  Goals Addressed            This Visit's Progress     Patient Stated   . "I can't afford this medication" (pt-stated)       CARE PLAN ENTRY (see longtitudinal plan of care for additional care plan information)  Current Barriers:  . Diabetes: uncontrolled, complicated by chronic medical conditions including HLD, HTN, neuropathy, most recent A1c 10.3% . Most recent eGFR: ~58 mL/min . Current antihyperglycemic regimen: metformin XR 2000 mg daily, insulin NPH 32 units QPM, glimepiride 2 mg QAM . Current blood glucose readings: o Fastings: 209; upper 150-210s o Not checking post prandial . Cardiovascular risk reduction: o Current hypertensive regimen:  o amlodipine 5 mg daily, lisinopril 20 mg daily, propranolol ER 60 mg  o Current hyperlipidemia regimen: atorvastatin 10 mg daily  o Current antiplatelet regimen: ASA 81 mg daily  Pharmacist Clinical Goal(s):  Marland Kitchen Over the next 90 days, patient will work with PharmD and primary care provider to address medication optimization  Interventions: . Comprehensive medication review performed, medication list updated in electronic medical record . Contacted BCBS. Patient has an individual medical + pharmacy deductible of $8150/year. He has met $1417 thus far.  . Reviewed coverage: Jardiance and Wilder Glade are preferred, but cost is >$550 on his insurance, since he has not met his deductible yet. These two copay cards take of <$200 per month on a prescription, so cost would still be unaffordable. However, Steglatro copay card advertises taking off up to $583. Unsure if this full benefit can be applied to commercial patients, but we will try. Counseled patient on how to sign up for copay card, and encouraged he take to the pharmacy. Start Steglatro 5 mg once daily.  . Regarding insulin therapy, Lilly advertises a max of $35/month for their products for commercially insured patients. Unsure if this will apply for patients whose insurance  does not cover the medication. If able, start Basaglar 40 units once daily. Will provide titration instructions in future, pending affordability. Reviewed improved pharmacokinetic profile of Basaglar vs insulin NPH  . For now, continue glimepiride. Will evaluate need in the future if above solutions work. . Reviewed goal A1c, goal fasting glucose, goal post prandial glucose  Patient Self Care Activities:  . Patient will check blood glucose daily, document, and provide at future appointments . Patient will take medications as prescribed . Patient will report any questions or concerns to provider   Initial goal documentation        Patient verbalizes understanding of instructions provided today.   Plan:  - Will await call from patient with above solutions  [name], PharmD, [place], [place] Pharmacist  [place]  [phone]

## 2019-11-09 NOTE — Progress Notes (Signed)
error 

## 2019-11-15 ENCOUNTER — Ambulatory Visit: Payer: Self-pay | Admitting: Pharmacist

## 2019-11-15 ENCOUNTER — Telehealth: Payer: Self-pay | Admitting: Pharmacist

## 2019-11-15 DIAGNOSIS — E1165 Type 2 diabetes mellitus with hyperglycemia: Secondary | ICD-10-CM

## 2019-11-15 MED ORDER — PEN NEEDLES 32G X 4 MM MISC: 1.0000 "pen " | Freq: Every day | 3 refills | Status: DC

## 2019-11-15 NOTE — Patient Instructions (Signed)
Visit Information  Goals Addressed            This Visit's Progress     Patient Stated   . "I can't afford this medication" (pt-stated)       CARE PLAN ENTRY (see longtitudinal plan of care for additional care plan information)  Current Barriers:  . Diabetes: uncontrolled, complicated by chronic medical conditions including HLD, HTN, neuropathy, most recent A1c 10.3% . Most recent eGFR: ~58 mL/min . Current antihyperglycemic regimen: metformin XR 2000 mg daily, insulin NPH 32 units QPM, glimepiride 2 mg QAM o Adding Steglatro 5 mg daily (ran through for $0 with copay card) o Working on switching NPH to Basaglar - patient reports pharmacy needed additional information . Cardiovascular risk reduction: o Current hypertensive regimen: amlodipine 5 mg daily, lisinopril 20 mg daily, propranolol ER 60 mg  o Current hyperlipidemia regimen: atorvastatin 10 mg daily  o Current antiplatelet regimen: ASA 81 mg daily  Pharmacist Clinical Goal(s):  . Over the next 90 days, patient will work with PharmD and primary care provider to address medication optimization  Interventions: . Contacted pharmacy. They note that a PA is needed for Basaglar on his insurance. Tried the Lilly Cares card for uninsured patients (so doesn't require commercial coverage); it reduced copay to $105, which is more than patient is currently paying for insulin NPH . Will collaborate w/ CMA to attempt PA for Basaglar.   Patient Self Care Activities:  . Patient will check blood glucose daily, document, and provide at future appointments . Patient will take medications as prescribed . Patient will report any questions or concerns to provider   Please see past updates related to this goal by clicking on the "Past Updates" button in the selected goal         Patient verbalizes understanding of instructions provided today.   Plan:  - Will await PA decision  Catie Travis, PharmD, BCACP, CPP Clinical  Pharmacist [facility] [phone]   

## 2019-11-15 NOTE — Chronic Care Management (AMB) (Signed)
Chronic Care Management   Follow Up Note   11/15/2019 Name: Jonathan West MRN: [id number] DOB: 17-Sep-1960  Referred by: Leone Haven, MD Reason for referral : Chronic Care Management (Medication Management)   JC VERON is a 59 y.o. year old male who is a primary care patient of Caryl Bis, Angela Adam, MD. The CCM team was consulted for assistance with chronic disease management and care coordination needs.    Received message from patient with medication access needs.   Review of patient status, including review of consultants reports, relevant laboratory and other test results, and collaboration with appropriate care team members and the patient's provider was performed as part of comprehensive patient evaluation and provision of chronic care management services.    SDOH (Social Determinants of Health) assessments performed: Yes See Care Plan activities for detailed interventions related to [facility])     Outpatient Encounter Medications as of 11/15/2019  Medication Sig  . amLODipine (NORVASC) 5 MG tablet Take 1 tablet (5 mg total) by mouth daily.  Marland Kitchen aspirin EC 81 MG tablet Take 81 mg by mouth daily.  Marland Kitchen atorvastatin (LIPITOR) 10 MG tablet Take 1 tablet (10 mg total) by mouth daily.  . blood glucose meter kit and supplies Dispense based on patient and insurance preference. Use up to four times daily as directed. (FOR ICD-9 250.00, 250.01).  Marland Kitchen Ertugliflozin L-PyroglutamicAc (STEGLATRO) 5 MG TABS Take 5 mg by mouth daily before breakfast.  . gabapentin (NEURONTIN) 300 MG capsule Take 2 capsules (600 mg total) by mouth at bedtime.  Marland Kitchen glimepiride (AMARYL) 2 MG tablet Take 1 tablet (2 mg total) by mouth daily with breakfast.  . glucose blood test strip CHECK BLOOD SUGAR 3 TIMES  DAILY  . Insulin Glargine (BASAGLAR KWIKPEN) 100 UNIT/ML Inject 40 units daily. Titrate as instructed. Max daily dose 60 units daily  . insulin NPH Human (NOVOLIN N) 100 UNIT/ML injection Inject 32 Units into  the skin at bedtime.  . Insulin Pen Needle (PEN NEEDLES) 32G X 4 MM MISC Inject 1 pen into the skin daily. Use to inject insulin daily.  Marland Kitchen lisinopril (ZESTRIL) 20 MG tablet Take 2 tablets (40 mg total) by mouth daily.  . metFORMIN (GLUCOPHAGE-XR) 500 MG 24 hr tablet Take 4 tablets (2,000 mg total) by mouth daily with breakfast.  . ONETOUCH DELICA LANCETS 56E MISC Check blood glucose three times daily DX: E11.65  . propranolol ER (INDERAL LA) 60 MG 24 hr capsule Take 1 capsule (60 mg total) by mouth daily.  . Vitamin D, Ergocalciferol, 2000 units CAPS Take by mouth daily.  . [DISCONTINUED] Insulin Pen Needle 29G X 12.7MM MISC Use to inject insulin daily   No facility-administered encounter medications on file as of 11/15/2019.     Objective:   Goals Addressed            This Visit's Progress     Patient Stated   . "I can't afford this medication" (pt-stated)       CARE PLAN ENTRY (see longtitudinal plan of care for additional care plan information)  Current Barriers:  . Diabetes: uncontrolled, complicated by chronic medical conditions including HLD, HTN, neuropathy, most recent A1c 10.3% . Most recent eGFR: ~58 mL/min . Current antihyperglycemic regimen: metformin XR 2000 mg daily, insulin NPH 32 units QPM, glimepiride 2 mg QAM o Adding Steglatro 5 mg daily (ran through for $0 with copay card) o Working on switching NPH to [employer] - patient reports pharmacy needed additional information . Cardiovascular  risk reduction: o Current hypertensive regimen: amlodipine 5 mg daily, lisinopril 20 mg daily, propranolol ER 60 mg  o Current hyperlipidemia regimen: atorvastatin 10 mg daily  o Current antiplatelet regimen: ASA 81 mg daily  Pharmacist Clinical Goal(s):  Marland Kitchen Over the next 90 days, patient will work with PharmD and primary care provider to address medication optimization  Interventions: . Contacted pharmacy. They note that a PA is needed for [employer] on his insurance. Tried the  [employer] for uninsured patients (so doesn't require commercial coverage); it reduced copay to $105, which is more than patient is currently paying for insulin NPH . Will collaborate w/ CMA to attempt PA for Basaglar.   Patient Self Care Activities:  . Patient will check blood glucose daily, document, and provide at future appointments . Patient will take medications as prescribed . Patient will report any questions or concerns to provider   Please see past updates related to this goal by clicking on the "Past Updates" button in the selected goal          Plan:  - Will await PA decision  [name], PharmD, [place], [place] Pharmacist [place] [phone]

## 2019-11-15 NOTE — Telephone Encounter (Signed)
Gae Bon,   Could you attempt a PA for Mr. Plotnick for Maynard?  If denied, please let me know preferred options. Thanks!

## 2019-11-17 NOTE — Telephone Encounter (Signed)
Started a PA awaiting a response.  Ovie Cornelio,cma

## 2019-11-20 ENCOUNTER — Telehealth: Payer: Self-pay | Admitting: Family Medicine

## 2019-11-20 ENCOUNTER — Telehealth: Payer: Self-pay

## 2019-11-20 NOTE — Telephone Encounter (Signed)
I received a denial from [facility] for the patient's Basglar and I called them and informed them that he tried and failed another medication Tyler Aas and his PA was approved and I called and informed the patient that it was approved and it would be ready to pick up in a couple hours.  He understood.  Tomesha Sargent,cma

## 2019-11-20 NOTE — Telephone Encounter (Signed)
I called and got the medication basaglar approved and I informed the patient.  Merrell Borsuk,cma

## 2019-11-20 NOTE — Telephone Encounter (Signed)
[employer] states that Insulin Glargine (BASAGLAR KWIKPEN) 100 UNIT/ML prior auth was denied because the med coverage requires TWO other failed medications. May need peer to peer

## 2019-12-03 ENCOUNTER — Other Ambulatory Visit: Payer: Self-pay | Admitting: Family Medicine

## 2019-12-10 ENCOUNTER — Encounter: Payer: Self-pay | Admitting: Family Medicine

## 2019-12-10 DIAGNOSIS — I10 Essential (primary) hypertension: Secondary | ICD-10-CM

## 2019-12-11 MED ORDER — PROPRANOLOL HCL ER 60 MG PO CP24: 60.0000 mg | ORAL_CAPSULE | Freq: Every day | ORAL | 3 refills | Status: DC

## 2019-12-25 ENCOUNTER — Other Ambulatory Visit: Payer: Self-pay

## 2019-12-25 ENCOUNTER — Ambulatory Visit (INDEPENDENT_AMBULATORY_CARE_PROVIDER_SITE_OTHER): Payer: BC Managed Care – PPO | Admitting: Family Medicine

## 2019-12-25 ENCOUNTER — Encounter: Payer: Self-pay | Admitting: Family Medicine

## 2019-12-25 VITALS — BP 120/70 | HR 78 | Temp 95.4°F | Ht 70.0 in | Wt 182.4 lb

## 2019-12-25 DIAGNOSIS — G629 Polyneuropathy, unspecified: Secondary | ICD-10-CM | POA: Diagnosis not present

## 2019-12-25 DIAGNOSIS — R252 Cramp and spasm: Secondary | ICD-10-CM | POA: Diagnosis not present

## 2019-12-25 DIAGNOSIS — I10 Essential (primary) hypertension: Secondary | ICD-10-CM

## 2019-12-25 DIAGNOSIS — E1165 Type 2 diabetes mellitus with hyperglycemia: Secondary | ICD-10-CM | POA: Diagnosis not present

## 2019-12-25 MED ORDER — GLUCOSE BLOOD VI STRP: ORAL_STRIP | 3 refills | Status: DC

## 2019-12-25 NOTE — Assessment & Plan Note (Signed)
Improving control.  Continue current regimen.  Check A1c.

## 2019-12-25 NOTE — Assessment & Plan Note (Signed)
Well-controlled.  Continue current regimen. 

## 2019-12-25 NOTE — Patient Instructions (Signed)
Nice to see you. Please try increasing your gabapentin to 900 mg at night.  If this makes you excessively drowsy please let us know. Please increase your water intake and stretching. We will get labs and contact you with the results.

## 2019-12-25 NOTE — Assessment & Plan Note (Signed)
Encouraged hydration and stretching.  We will check a BMP for electrolyte disturbances.

## 2019-12-25 NOTE — Assessment & Plan Note (Signed)
Seems to have worsened since we decreased his gabapentin dose.  He will trial 900 mg nightly and see if that is helpful.  He will let us know how this works.

## 2019-12-25 NOTE — Progress Notes (Signed)
  [name], MD Phone: [phone]  Jonathan West is a 59 y.o. male who presents today for f/u.  DIABETES Disease Monitoring: Blood Sugar ranges-105-130 the past week, trended down from upper 100s Polyuria/phagia/dipsia- 1x nocturia      Optho- due Medications: Compliance- taking basaglar 40 units, glimeperide, metformin Hypoglycemic symptoms- no  HYPERTENSION  Disease Monitoring  Home BP Monitoring no Chest pain- no    Dyspnea- no Medications  Compliance-  Taking amlodipine, lisinopril, propranolol.  Edema- no  Neuropathy: Patient notes since going down on his gabapentin has noticed more prickly sensation in his feet and numbness in his left knee.  He has neuropathy as well as having had back surgeries previously.  Cramps: Patient notes he has been getting these at night in his legs.  He does not stretch.  He does not drink enough water.     Social History   Tobacco Use  Smoking Status Never Smoker  Smokeless Tobacco Never Used     ROS see history of present illness  Objective  Physical Exam Vitals:   12/25/19 1616  BP: 120/70  Pulse: 78  Temp: (!) 95.4 F (35.2 C)  SpO2: 98%    BP Readings from Last 3 Encounters:  12/25/19 120/70  09/18/19 120/70  06/14/19 100/60   Wt Readings from Last 3 Encounters:  12/25/19 182 lb 6.4 oz (82.7 kg)  09/18/19 183 lb 9.6 oz (83.3 kg)  07/14/19 189 lb (85.7 kg)    Physical Exam Constitutional:      General: He is not in acute distress.    Appearance: He is not diaphoretic.  Cardiovascular:     Rate and Rhythm: Normal rate and regular rhythm.     Heart sounds: Normal heart sounds.  Pulmonary:     Effort: Pulmonary effort is normal.     Breath sounds: Normal breath sounds.  Musculoskeletal:     Right lower leg: No edema.     Left lower leg: No edema.  Skin:    General: Skin is warm and dry.  Neurological:     Mental Status: He is alert.      Assessment/Plan: Please see individual problem  list.  Essential hypertension Well-controlled.  Continue current regimen.  DM (diabetes mellitus), type 2, uncontrolled Improving control.  Continue current regimen.  Check A1c.  Neuropathy Seems to have worsened since we decreased his gabapentin dose.  He will trial 900 mg nightly and see if that is helpful.  He will let us know how this works.  Muscle cramps Encouraged hydration and stretching.  We will check a BMP for electrolyte disturbances.   Health Maintenance: Patient will schedule his eye doctor appointment.  Orders Placed This Encounter  Procedures  . HgB A1c  . Basic Metabolic Panel (BMET)    Meds ordered this encounter  Medications  . glucose blood test strip    Sig: CHECK BLOOD SUGAR 3 TIMES  DAILY    Dispense:  300 each    Refill:  3    STRIPS FOR ONE TOUCH VERIO FLEX    This visit occurred during the SARS-CoV-2 public health emergency.  Safety protocols were in place, including screening questions prior to the visit, additional usage of staff PPE, and extensive cleaning of exam room while observing appropriate contact time as indicated for disinfecting solutions.    [name], MD [place]

## 2019-12-26 ENCOUNTER — Other Ambulatory Visit: Payer: Self-pay | Admitting: Family Medicine

## 2019-12-26 LAB — BASIC METABOLIC PANEL
BUN: 12 mg/dL (ref 6–23)
CO2: 25 mEq/L (ref 19–32)
Calcium: 9 mg/dL (ref 8.4–10.5)
Chloride: 108 mEq/L (ref 96–112)
Creatinine, Ser: 1.28 mg/dL (ref 0.40–1.50)
GFR: 57.15 mL/min — ABNORMAL LOW (ref >60.00)
Glucose, Bld: 111 mg/dL — ABNORMAL HIGH (ref 70–99)
Potassium: 4.2 mEq/L (ref 3.5–5.1)
Sodium: 142 mEq/L (ref 135–145)

## 2019-12-26 LAB — HEMOGLOBIN A1C: Hgb A1c MFr Bld: 9.5 % — ABNORMAL HIGH (ref 4.6–6.5)

## 2020-01-04 ENCOUNTER — Other Ambulatory Visit: Payer: Self-pay | Admitting: Family Medicine

## 2020-01-04 MED ORDER — OZEMPIC (0.25 OR 0.5 MG/DOSE) 2 MG/1.5ML ~~LOC~~ SOPN: PEN_INJECTOR | SUBCUTANEOUS | 1 refills | Status: DC

## 2020-01-14 ENCOUNTER — Encounter: Payer: Self-pay | Admitting: Family Medicine

## 2020-01-14 DIAGNOSIS — E1165 Type 2 diabetes mellitus with hyperglycemia: Secondary | ICD-10-CM

## 2020-01-15 MED ORDER — GLUCOSE BLOOD VI STRP: ORAL_STRIP | 3 refills | Status: DC

## 2020-01-25 ENCOUNTER — Ambulatory Visit: Payer: Self-pay | Admitting: Pharmacist

## 2020-01-25 ENCOUNTER — Telehealth: Payer: BC Managed Care – PPO

## 2020-01-25 NOTE — Chronic Care Management (AMB) (Signed)
  Chronic Care Management   Note  01/25/2020 Name: RODERRICK GROESBECK MRN: [id number] DOB: 1961-04-07  [name] is a 59 y.o. year old male who is a primary care patient of Caryl Bis, Angela Adam, MD. The CCM team was consulted for assistance with chronic disease management and care coordination needs.    Attempted to contact patient for medication management review. Left HIPAA compliant message for patient to return my call at their convenience.   Of note, per previously conversations w/ insurance, unlikely that GLP1 was affordable given his high deductible plan. Will discuss dose increase of Steglatro.  Plan: - Will collaborate with Care Guide to outreach to schedule follow up with me  [name], PharmD, [place], [place] Pharmacist [place] [phone]

## 2020-01-26 ENCOUNTER — Telehealth: Payer: Self-pay | Admitting: Family Medicine

## 2020-01-26 NOTE — Chronic Care Management (AMB) (Signed)
  Care Management   Note  01/26/2020 Name: [name] MRN: [id number] DOB: 1961-08-04  Jonathan West is a 59 y.o. year old male who is a primary care patient of Caryl Bis, Angela Adam, MD and is actively engaged with the care management team. I reached out to Jonathan West by phone today to assist with re-scheduling a follow up visit with the Pharmacist  Follow up plan: Telephone appointment with care management team member scheduled for:02/09/2020  Noreene Larsson, [place], [place], [place] [address] Direct Dial: [phone] [email or web address]

## 2020-01-28 ENCOUNTER — Other Ambulatory Visit: Payer: Self-pay | Admitting: Family Medicine

## 2020-01-28 DIAGNOSIS — E1165 Type 2 diabetes mellitus with hyperglycemia: Secondary | ICD-10-CM

## 2020-01-31 ENCOUNTER — Encounter: Payer: Self-pay | Admitting: Family Medicine

## 2020-02-01 ENCOUNTER — Other Ambulatory Visit: Payer: Self-pay

## 2020-02-01 DIAGNOSIS — E1165 Type 2 diabetes mellitus with hyperglycemia: Secondary | ICD-10-CM

## 2020-02-01 MED ORDER — STEGLATRO 5 MG PO TABS: ORAL_TABLET | ORAL | 3 refills | Status: DC

## 2020-02-09 ENCOUNTER — Ambulatory Visit: Payer: BC Managed Care – PPO | Admitting: Pharmacist

## 2020-02-09 DIAGNOSIS — E1165 Type 2 diabetes mellitus with hyperglycemia: Secondary | ICD-10-CM

## 2020-02-09 MED ORDER — STEGLATRO 15 MG PO TABS: 15.0000 mg | ORAL_TABLET | Freq: Every day | ORAL | 2 refills | Status: DC

## 2020-02-09 MED ORDER — BASAGLAR KWIKPEN 100 UNIT/ML ~~LOC~~ SOPN: PEN_INJECTOR | SUBCUTANEOUS | 3 refills | Status: DC

## 2020-02-09 NOTE — Chronic Care Management (AMB) (Signed)
Chronic Care Management   Follow Up Note   02/09/2020 Name: Jonathan West MRN: [id number] DOB: 03-10-1961  Referred by: Leone Haven, MD Reason for referral : Chronic Care Management (Medication Management)   Jonathan West is a 59 y.o. year old male who is a primary care patient of Caryl Bis, Angela Adam, MD. The CCM team was consulted for assistance with chronic disease management and care coordination needs.    Contacted patient for medication management review.   Review of patient status, including review of consultants reports, relevant laboratory and other test results, and collaboration with appropriate care team members and the patient's provider was performed as part of comprehensive patient evaluation and provision of chronic care management services.    SDOH (Social Determinants of Health) assessments performed: Yes See Care Plan activities for detailed interventions related to [facility])     Outpatient Encounter Medications as of 02/09/2020  Medication Sig   glimepiride (AMARYL) 2 MG tablet Take 1 tablet (2 mg total) by mouth daily with breakfast.   Insulin Glargine (BASAGLAR KWIKPEN) 100 UNIT/ML Inject 46 units daily. Titrate as instructed. Max daily dose 60 units daily   metFORMIN (GLUCOPHAGE-XR) 500 MG 24 hr tablet Take 4 tablets (2,000 mg total) by mouth daily with breakfast.   [DISCONTINUED] ertugliflozin L-PyroglutamicAc (STEGLATRO) 5 MG TABS tablet TAKE 1 TABLET BY MOUTH ONCE DAILY BEFORE BREAKFAST   [DISCONTINUED] Insulin Glargine (BASAGLAR KWIKPEN) 100 UNIT/ML Inject 40 units daily. Titrate as instructed. Max daily dose 60 units daily   amLODipine (NORVASC) 5 MG tablet Take 1 tablet (5 mg total) by mouth daily.   aspirin EC 81 MG tablet Take 81 mg by mouth daily.   atorvastatin (LIPITOR) 10 MG tablet TAKE 1 TABLET BY MOUTH EVERY DAY   blood glucose meter kit and supplies Dispense based on patient and insurance preference. Use up to four times daily as  directed. (FOR ICD-9 250.00, 250.01).   ertugliflozin L-PyroglutamicAc (STEGLATRO) 15 MG TABS tablet Take 1 tablet (15 mg total) by mouth daily before breakfast.   gabapentin (NEURONTIN) 300 MG capsule Take 2 capsules (600 mg total) by mouth at bedtime.   glucose blood test strip CHECK BLOOD SUGAR 3 TIMES  DAILY   Insulin Pen Needle (PEN NEEDLES) 32G X 4 MM MISC Inject 1 pen into the skin daily. Use to inject insulin daily.   lisinopril (ZESTRIL) 20 MG tablet Take 2 tablets (40 mg total) by mouth daily.   ONETOUCH DELICA LANCETS 29B MISC Check blood glucose three times daily DX: E11.65   propranolol ER (INDERAL LA) 60 MG 24 hr capsule Take 1 capsule (60 mg total) by mouth daily.   Vitamin D, Ergocalciferol, 2000 units CAPS Take by mouth daily.   [DISCONTINUED] Semaglutide,0.25 or 0.5MG/DOS, (OZEMPIC, 0.25 OR 0.5 MG/DOSE,) 2 MG/1.5ML SOPN Inject 0.25 mg into the skin once a week for 28 days, THEN 0.5 mg once a week.   No facility-administered encounter medications on file as of 02/09/2020.     Objective:   Goals Addressed            This Visit's Progress     Patient Stated    "I can't afford this medication" (pt-stated)       CARE PLAN ENTRY (see longtitudinal plan of care for additional care plan information)  Current Barriers:   Diabetes: uncontrolled, complicated by chronic medical conditions including HLD, HTN, neuropathy, most recent A1c 9.5% o Confirms affordability of Basaglar ($35/30 days, ~$95/90 days per [employer])  and Steglatro ($0 w/ copay card)  Most recent eGFR: ~58 mL/min  Current antihyperglycemic regimen: metformin XR 2000 mg daily, Basaglar 40 units daily, glimepiride 2 mg QAM, Steglatro   Current glucose readings:  o Fasting: mostly 150-160s;   Cardiovascular risk reduction: o Current hypertensive regimen: amlodipine 5 mg daily, lisinopril 20 mg daily, propranolol ER 60 mg  o Current hyperlipidemia regimen: atorvastatin 10 mg  daily  o Current antiplatelet regimen: ASA 81 mg daily  Pharmacist Clinical Goal(s):   Over the next 90 days, patient will work with PharmD and primary care provider to address medication optimization  Interventions:  Comprehensive medication review performed, medication list updated in electronic medical record  Inter-disciplinary care team collaboration (see longitudinal plan of care)  Reviewed BG readings. Will maximize Steglatro. Increase to 15 mg daily. Patient will take 3 of the tablets he currently has until supply completed.   Increase Basaglar to 46 units daily. Patient verbalizes understanding  Reviewed risk of hypoglycemia w/ glimepiride. Encouraged to call me if hypoglycemia develops.   Patient Self Care Activities:   Patient will check blood glucose daily, document, and provide at future appointments  Patient will take medications as prescribed  Patient will report any questions or concerns to provider   Please see past updates related to this goal by clicking on the "Past Updates" button in the selected goal          Plan:  = Patient has PCP f/u in ~6  Weeks. Scheduled f/u with me in ~ 12 weeks  [name], PharmD, [place], [place] Pharmacist [place] [phone]

## 2020-02-09 NOTE — Patient Instructions (Signed)
Visit Information  Goals Addressed            This Visit's Progress     Patient Stated   . "I can't afford this medication" (pt-stated)       CARE PLAN ENTRY (see longtitudinal plan of care for additional care plan information)  Current Barriers:  . Diabetes: uncontrolled, complicated by chronic medical conditions including HLD, HTN, neuropathy, most recent A1c 9.5% o Confirms affordability of Basaglar ($35/30 days, ~$95/90 days per [employer]) and Steglatro ($0 w/ copay card) . Most recent eGFR: ~58 mL/min . Current antihyperglycemic regimen: metformin XR 2000 mg daily, Basaglar 40 units daily, glimepiride 2 mg QAM, Steglatro  . Current glucose readings:  o Fasting: mostly 150-160s;  . Cardiovascular risk reduction: o Current hypertensive regimen: amlodipine 5 mg daily, lisinopril 20 mg daily, propranolol ER 60 mg  o Current hyperlipidemia regimen: atorvastatin 10 mg daily  o Current antiplatelet regimen: ASA 81 mg daily  Pharmacist Clinical Goal(s):  Marland Kitchen Over the next 90 days, patient will work with PharmD and primary care provider to address medication optimization  Interventions: . Comprehensive medication review performed, medication list updated in electronic medical record . Inter-disciplinary care team collaboration (see longitudinal plan of care) . Reviewed BG readings. Will maximize Steglatro. Increase to 15 mg daily. Patient will take 3 of the tablets he currently has until supply completed.  . Increase Basaglar to 46 units daily. Patient verbalizes understanding . Reviewed risk of hypoglycemia w/ glimepiride. Encouraged to call me if hypoglycemia develops.   Patient Self Care Activities:  . Patient will check blood glucose daily, document, and provide at future appointments . Patient will take medications as prescribed . Patient will report any questions or concerns to provider   Please see past updates related to this goal by clicking on the "Past  Updates" button in the selected goal         Patient verbalizes understanding of instructions provided today.      Plan:  = Patient has PCP f/u in ~6  Weeks. Scheduled f/u with me in ~ 12 weeks  [name], PharmD, [place], [place] Pharmacist [place] [phone]

## 2020-02-21 ENCOUNTER — Encounter: Payer: Self-pay | Admitting: Family Medicine

## 2020-02-23 ENCOUNTER — Ambulatory Visit: Payer: BC Managed Care – PPO | Admitting: Pharmacist

## 2020-02-23 DIAGNOSIS — E1165 Type 2 diabetes mellitus with hyperglycemia: Secondary | ICD-10-CM

## 2020-02-23 NOTE — Chronic Care Management (AMB) (Signed)
Chronic Care Management   Follow Up Note   02/23/2020 Name: Jonathan West MRN: [id number] DOB: 06-Mar-1961  Referred by: Leone Haven, MD Reason for referral : Chronic Care Management   Jonathan West is a 59 y.o. year old male who is a primary care patient of Caryl Bis, Angela Adam, MD. The CCM team was consulted for assistance with chronic disease management and care coordination needs.    Review of patient status, including review of consultants reports, relevant laboratory and other test results, and collaboration with appropriate care team members and the patient's provider was performed as part of comprehensive patient evaluation and provision of chronic care management services.    SDOH (Social Determinants of Health) assessments performed: Yes See Care Plan activities for detailed interventions related to [facility])     Outpatient Encounter Medications as of 02/23/2020  Medication Sig   amLODipine (NORVASC) 5 MG tablet Take 1 tablet (5 mg total) by mouth daily.   aspirin EC 81 MG tablet Take 81 mg by mouth daily.   atorvastatin (LIPITOR) 10 MG tablet TAKE 1 TABLET BY MOUTH EVERY DAY   blood glucose meter kit and supplies Dispense based on patient and insurance preference. Use up to four times daily as directed. (FOR ICD-9 250.00, 250.01).   ertugliflozin L-PyroglutamicAc (STEGLATRO) 15 MG TABS tablet Take 1 tablet (15 mg total) by mouth daily before breakfast.   gabapentin (NEURONTIN) 300 MG capsule Take 2 capsules (600 mg total) by mouth at bedtime.   glimepiride (AMARYL) 2 MG tablet Take 1 tablet (2 mg total) by mouth daily with breakfast.   glucose blood test strip CHECK BLOOD SUGAR 3 TIMES  DAILY   Insulin Glargine (BASAGLAR KWIKPEN) 100 UNIT/ML Inject 46 units daily. Titrate as instructed. Max daily dose 60 units daily   Insulin Pen Needle (PEN NEEDLES) 32G X 4 MM MISC Inject 1 pen into the skin daily. Use to inject insulin daily.   lisinopril (ZESTRIL) 20 MG  tablet Take 2 tablets (40 mg total) by mouth daily.   metFORMIN (GLUCOPHAGE-XR) 500 MG 24 hr tablet Take 4 tablets (2,000 mg total) by mouth daily with breakfast.   ONETOUCH DELICA LANCETS 56E MISC Check blood glucose three times daily DX: E11.65   propranolol ER (INDERAL LA) 60 MG 24 hr capsule Take 1 capsule (60 mg total) by mouth daily.   Vitamin D, Ergocalciferol, 2000 units CAPS Take by mouth daily.   No facility-administered encounter medications on file as of 02/23/2020.     Objective:   Goals Addressed              This Visit's Progress     Patient Stated     "I can't afford this medication" (pt-stated)        CARE PLAN ENTRY (see longtitudinal plan of care for additional care plan information)  Current Barriers:   Diabetes: uncontrolled, complicated by chronic medical conditions including HLD, HTN, neuropathy, most recent A1c 9.5% o Received message from patient today that Steglatro savings card was not working  Most recent eGFR: ~58 mL/min  Current antihyperglycemic regimen: metformin XR 2000 mg daily, Basaglar 46 units daily, glimepiride 2 mg QAM, Steglatro 15 mg daily   Cardiovascular risk reduction: o Current hypertensive regimen: amlodipine 5 mg daily, lisinopril 20 mg daily, propranolol ER 60 mg  o Current hyperlipidemia regimen: atorvastatin 10 mg daily  o Current antiplatelet regimen: ASA 81 mg daily  Pharmacist Clinical Goal(s):   Over the next 90 days, patient will  work with PharmD and primary care provider to address medication optimization  Interventions:  Comprehensive medication review performed, medication list updated in electronic medical record  Inter-disciplinary care team collaboration (see longitudinal plan of care)  Contacted [employer]. PA is required for Steglatro 15 mg daily. Completed PA through [facility], will await response.   Patient Self Care Activities:   Patient will check blood glucose daily, document, and provide at future  appointments  Patient will take medications as prescribed  Patient will report any questions or concerns to provider   Please see past updates related to this goal by clicking on the "Past Updates" button in the selected goal          Plan:  - Will outreach patient as previously scheduled  [name], PharmD, [place], [place] Pharmacist [place] [phone]

## 2020-02-23 NOTE — Patient Instructions (Signed)
Visit Information  Goals Addressed              This Visit's Progress     Patient Stated   .  "I can't afford this medication" (pt-stated)        CARE PLAN ENTRY (see longtitudinal plan of care for additional care plan information)  Current Barriers:  . Diabetes: uncontrolled, complicated by chronic medical conditions including HLD, HTN, neuropathy, most recent A1c 9.5% o Received message from patient today that [place] savings card was not working . Most recent eGFR: ~58 mL/min . Current antihyperglycemic regimen: metformin XR 2000 mg daily, Basaglar 46 units daily, glimepiride 2 mg QAM, Steglatro 15 mg daily  . Cardiovascular risk reduction: o Current hypertensive regimen: amlodipine 5 mg daily, lisinopril 20 mg daily, propranolol ER 60 mg  o Current hyperlipidemia regimen: atorvastatin 10 mg daily  o Current antiplatelet regimen: ASA 81 mg daily  Pharmacist Clinical Goal(s):  Marland Kitchen Over the next 90 days, patient will work with PharmD and primary care provider to address medication optimization  Interventions: . Comprehensive medication review performed, medication list updated in electronic medical record . Inter-disciplinary care team collaboration (see longitudinal plan of care) . Contacted [employer]. PA is required for Steglatro 15 mg daily. Completed PA through [facility], will await response.   Patient Self Care Activities:  . Patient will check blood glucose daily, document, and provide at future appointments . Patient will take medications as prescribed . Patient will report any questions or concerns to provider   Please see past updates related to this goal by clicking on the "Past Updates" button in the selected goal         The patient verbalized understanding of instructions provided today and declined a print copy of patient instruction materials.  Plan:  - Will outreach patient as previously scheduled  [name], PharmD, [place], [place]  Pharmacist [place] [phone]

## 2020-02-26 ENCOUNTER — Ambulatory Visit: Payer: BC Managed Care – PPO | Admitting: Pharmacist

## 2020-02-26 DIAGNOSIS — E1165 Type 2 diabetes mellitus with hyperglycemia: Secondary | ICD-10-CM

## 2020-02-26 NOTE — Chronic Care Management (AMB) (Signed)
Chronic Care Management   Follow Up Note   02/26/2020 Name: Jonathan West MRN: [id number] DOB: Dec 07, 1960  Referred by: Leone Haven, MD Reason for referral : Chronic Care Management (Medication Management)   JAHMEER PORCHE is a 59 y.o. year old male who is a primary care patient of Caryl Bis, Angela Adam, MD. The CCM team was consulted for assistance with chronic disease management and care coordination needs.    Care coordination completed today  Review of patient status, including review of consultants reports, relevant laboratory and other test results, and collaboration with appropriate care team members and the patient's provider was performed as part of comprehensive patient evaluation and provision of chronic care management services.    SDOH (Social Determinants of Health) assessments performed: No See Care Plan activities for detailed interventions related to [facility])     Outpatient Encounter Medications as of 02/26/2020  Medication Sig  . amLODipine (NORVASC) 5 MG tablet Take 1 tablet (5 mg total) by mouth daily.  Marland Kitchen aspirin EC 81 MG tablet Take 81 mg by mouth daily.  Marland Kitchen atorvastatin (LIPITOR) 10 MG tablet TAKE 1 TABLET BY MOUTH EVERY DAY  . blood glucose meter kit and supplies Dispense based on patient and insurance preference. Use up to four times daily as directed. (FOR ICD-9 250.00, 250.01).  Marland Kitchen ertugliflozin L-PyroglutamicAc (STEGLATRO) 15 MG TABS tablet Take 1 tablet (15 mg total) by mouth daily before breakfast.  . gabapentin (NEURONTIN) 300 MG capsule Take 2 capsules (600 mg total) by mouth at bedtime.  Marland Kitchen glimepiride (AMARYL) 2 MG tablet Take 1 tablet (2 mg total) by mouth daily with breakfast.  . glucose blood test strip CHECK BLOOD SUGAR 3 TIMES  DAILY  . Insulin Glargine (BASAGLAR KWIKPEN) 100 UNIT/ML Inject 46 units daily. Titrate as instructed. Max daily dose 60 units daily  . Insulin Pen Needle (PEN NEEDLES) 32G X 4 MM MISC Inject 1 pen into the skin daily. Use  to inject insulin daily.  Marland Kitchen lisinopril (ZESTRIL) 20 MG tablet Take 2 tablets (40 mg total) by mouth daily.  . metFORMIN (GLUCOPHAGE-XR) 500 MG 24 hr tablet Take 4 tablets (2,000 mg total) by mouth daily with breakfast.  . ONETOUCH DELICA LANCETS 73U MISC Check blood glucose three times daily DX: E11.65  . propranolol ER (INDERAL LA) 60 MG 24 hr capsule Take 1 capsule (60 mg total) by mouth daily.  . Vitamin D, Ergocalciferol, 2000 units CAPS Take by mouth daily.   No facility-administered encounter medications on file as of 02/26/2020.     Objective:   Goals Addressed              This Visit's Progress     Patient Stated   .  "I can't afford this medication" (pt-stated)        CARE PLAN ENTRY (see longtitudinal plan of care for additional care plan information)  Current Barriers:  . Diabetes: uncontrolled, complicated by chronic medical conditions including HLD, HTN, neuropathy, most recent A1c 9.5% o Received notification that PA for [facility] was denied by [facility]. Realized that PA was not completed for Steglatro 5 mg, we just ran under the copay card w/o his insurance . Most recent eGFR: ~58 mL/min . Current antihyperglycemic regimen: metformin XR 2000 mg daily, Basaglar 46 units daily, glimepiride 2 mg QAM, Steglatro 15 mg daily  . Cardiovascular risk reduction: o Current hypertensive regimen: amlodipine 5 mg daily, lisinopril 20 mg daily, propranolol ER 60 mg  o Current hyperlipidemia regimen: atorvastatin  10 mg daily  o Current antiplatelet regimen: ASA 81 mg daily  Pharmacist Clinical Goal(s):  . Over the next 90 days, patient will work with PharmD and primary care provider to address medication optimization  Interventions: . Comprehensive medication review performed, medication list updated in electronic medical record . Inter-disciplinary care team collaboration (see longitudinal plan of care) . Contacted [facility] to ask to run Steglatro w/o insurance. They have already  filled the medication - patient called [employer] and found out that there has to be at least 21 days between fills (even if a dose increase). It is now 21 days and thus medication ran through.   Patient Self Care Activities:  . Patient will check blood glucose daily, document, and provide at future appointments . Patient will take medications as prescribed . Patient will report any questions or concerns to provider   Please see past updates related to this goal by clicking on the "Past Updates" button in the selected goal          Plan:  - Will outreach as previously scheduled  Catie Travis, PharmD, BCACP, CPP Clinical Pharmacist [facility] [place] [phone]   

## 2020-02-26 NOTE — Patient Instructions (Signed)
Visit Information  Goals Addressed              This Visit's Progress     Patient Stated   .  "I can't afford this medication" (pt-stated)        CARE PLAN ENTRY (see longtitudinal plan of care for additional care plan information)  Current Barriers:  . Diabetes: uncontrolled, complicated by chronic medical conditions including HLD, HTN, neuropathy, most recent A1c 9.5% o Received notification that PA for [facility] was denied by [facility]. Realized that PA was not completed for Steglatro 5 mg, we just ran under the copay card w/o his insurance . Most recent eGFR: ~58 mL/min . Current antihyperglycemic regimen: metformin XR 2000 mg daily, Basaglar 46 units daily, glimepiride 2 mg QAM, Steglatro 15 mg daily  . Cardiovascular risk reduction: o Current hypertensive regimen: amlodipine 5 mg daily, lisinopril 20 mg daily, propranolol ER 60 mg  o Current hyperlipidemia regimen: atorvastatin 10 mg daily  o Current antiplatelet regimen: ASA 81 mg daily  Pharmacist Clinical Goal(s):  Marland Kitchen Over the next 90 days, patient will work with PharmD and primary care provider to address medication optimization  Interventions: . Comprehensive medication review performed, medication list updated in electronic medical record . Inter-disciplinary care team collaboration (see longitudinal plan of care) . Contacted [facility] to ask to run [employer] w/o insurance. They have already filled the medication - patient called [employer] and found out that there has to be at least 21 days between fills (even if a dose increase). It is now 21 days and thus medication ran through.   Patient Self Care Activities:  . Patient will check blood glucose daily, document, and provide at future appointments . Patient will take medications as prescribed . Patient will report any questions or concerns to provider   Please see past updates related to this goal by clicking on the "Past Updates" button in the selected goal         The  patient verbalized understanding of instructions provided today and declined a print copy of patient instruction materials.    Plan:  - Will outreach as previously scheduled  [name], PharmD, [place], [place] Pharmacist [place] [phone]

## 2020-03-10 ENCOUNTER — Other Ambulatory Visit: Payer: Self-pay | Admitting: Family Medicine

## 2020-03-17 ENCOUNTER — Other Ambulatory Visit: Payer: Self-pay | Admitting: Family Medicine

## 2020-03-18 MED ORDER — GABAPENTIN 300 MG PO CAPS: 600.0000 mg | ORAL_CAPSULE | Freq: Every day | ORAL | 0 refills | Status: DC

## 2020-03-20 ENCOUNTER — Telehealth: Payer: Self-pay | Admitting: Family Medicine

## 2020-03-20 MED ORDER — GABAPENTIN 300 MG PO CAPS: 900.0000 mg | ORAL_CAPSULE | Freq: Every day | ORAL | 2 refills | Status: DC

## 2020-03-20 NOTE — Telephone Encounter (Signed)
New prescription reflecting this change sent to pharmacy.

## 2020-03-20 NOTE — Telephone Encounter (Signed)
PT states that he picked up refill of gabapentin (NEURONTIN) 300 MG capsule and the directions were old. It states that he needs to take 2 a day but PCP changed it to three times a day. Please advise

## 2020-03-27 ENCOUNTER — Encounter: Payer: Self-pay | Admitting: Family Medicine

## 2020-03-27 ENCOUNTER — Ambulatory Visit (INDEPENDENT_AMBULATORY_CARE_PROVIDER_SITE_OTHER): Payer: BC Managed Care – PPO | Admitting: Family Medicine

## 2020-03-27 ENCOUNTER — Other Ambulatory Visit: Payer: Self-pay

## 2020-03-27 ENCOUNTER — Ambulatory Visit (INDEPENDENT_AMBULATORY_CARE_PROVIDER_SITE_OTHER): Payer: BC Managed Care – PPO

## 2020-03-27 DIAGNOSIS — S0001XA Abrasion of scalp, initial encounter: Secondary | ICD-10-CM | POA: Insufficient documentation

## 2020-03-27 DIAGNOSIS — I10 Essential (primary) hypertension: Secondary | ICD-10-CM

## 2020-03-27 DIAGNOSIS — M5010 Cervical disc disorder with radiculopathy, unspecified cervical region: Secondary | ICD-10-CM | POA: Diagnosis not present

## 2020-03-27 DIAGNOSIS — M4722 Other spondylosis with radiculopathy, cervical region: Secondary | ICD-10-CM | POA: Diagnosis not present

## 2020-03-27 DIAGNOSIS — M5412 Radiculopathy, cervical region: Secondary | ICD-10-CM

## 2020-03-27 DIAGNOSIS — E1165 Type 2 diabetes mellitus with hyperglycemia: Secondary | ICD-10-CM | POA: Diagnosis not present

## 2020-03-27 DIAGNOSIS — M5410 Radiculopathy, site unspecified: Secondary | ICD-10-CM | POA: Insufficient documentation

## 2020-03-27 LAB — POCT GLYCOSYLATED HEMOGLOBIN (HGB A1C): Hemoglobin A1C: 8 % — AB (ref 4.0–5.6)

## 2020-03-27 NOTE — Patient Instructions (Signed)
Nice to see you. We will contact you with your x-ray result. Please monitor the abrasions on your head for any signs of infection.

## 2020-03-27 NOTE — Assessment & Plan Note (Signed)
Symptoms are concerning for nerve impingement.  We will start with a cervical spine x-ray.

## 2020-03-27 NOTE — Progress Notes (Signed)
Jonathan Rumps, MD Phone: [phone]  Jonathan West is a 59 y.o. male who presents today for f/u.  DIABETES Disease Monitoring: Blood Sugar ranges-fasting generally <150, excursions to 200 Polyuria/phagia/dipsia- improved   Optho- due Medications: Compliance- taking glimeperide, basaglar, metformin, steglatro Hypoglycemic symptoms- one occasion was 57, ate and improved.   HYPERTENSION  Disease Monitoring  Home BP Monitoring no Chest pain- no    Dyspnea- no Medications  Compliance-  Taking amlodipine, lisinopril. Edema- no  Scalp abrasion: Patient notes he was cleaning up some debris in his yard yesterday and tried to flip a board and it hit him on the right lateral aspect of his scalp.  He has 2 abrasions there.  He had no syncope.  He said no headaches.  No vision changes.  No confusion.  He cleaned the area with water.  Radiculopathy: Patient notes occasional numbness in portions of his hands and then he also has occasional tingling that radiates from his right anterior shoulder down his arm into his thumb area on the right.  No significant neck pain.    Social History   Tobacco Use  Smoking Status Never Smoker  Smokeless Tobacco Never Used     ROS see history of present illness  Objective  Physical Exam Vitals:   03/27/20 1541  BP: 118/70  Pulse: 78  Temp: 98.1 F (36.7 C)  SpO2: 98%    BP Readings from Last 3 Encounters:  03/27/20 118/70  12/25/19 120/70  09/18/19 120/70   Wt Readings from Last 3 Encounters:  03/27/20 184 lb 6.4 oz (83.6 kg)  12/25/19 182 lb 6.4 oz (82.7 kg)  09/18/19 183 lb 9.6 oz (83.3 kg)    Physical Exam Constitutional:      General: He is not in acute distress.    Appearance: He is not diaphoretic.  HENT:     Head:   Cardiovascular:     Rate and Rhythm: Normal rate and regular rhythm.     Heart sounds: Normal heart sounds.  Pulmonary:     Effort: Pulmonary effort is normal.     Breath sounds: Normal breath sounds.    Musculoskeletal:     Right lower leg: No edema.     Left lower leg: No edema.  Skin:    General: Skin is warm and dry.  Neurological:     Mental Status: He is alert.     Comments: 5/5 strength in bilateral biceps, triceps, grip, quads, hamstrings, plantar and dorsiflexion, sensation to light touch intact in bilateral UE and LE, normal gait, 2+ biceps and brachioradialis reflexes, absent patellar reflexes      Assessment/Plan: Please see individual problem list.  Essential hypertension Well-controlled.  Continue current regimen.  DM (diabetes mellitus), type 2, uncontrolled Improving fasting sugars.  He will continue his current regimen.  Check A1c.  Radiculopathy Symptoms are concerning for nerve impingement.  We will start with a cervical spine x-ray.  Scalp abrasion Does not appear infected.  He will monitor for any signs of infection.   Orders Placed This Encounter  Procedures  . DG Cervical Spine Complete    Standing Status:   Future    Number of Occurrences:   1    Standing Expiration Date:   03/27/2021    Order Specific Question:   Reason for Exam (SYMPTOM  OR DIAGNOSIS REQUIRED)    Answer:   bilateral upper extermity radicular tingling and numbness    Order Specific Question:   Preferred imaging location?  Answer:   [facility] Specific Question:   Radiology Contrast Protocol - do NOT remove file path    Answer:   \\[email or web address]\DXFluoroContrastProtocols.pdf  . POCT HgB A1C    No orders of the defined types were placed in this encounter.   This visit occurred during the SARS-CoV-2 public health emergency.  Safety protocols were in place, including screening questions prior to the visit, additional usage of staff PPE, and extensive cleaning of exam room while observing appropriate contact time as indicated for disinfecting solutions.    Jonathan Rumps, MD [place]

## 2020-03-27 NOTE — Assessment & Plan Note (Signed)
Well-controlled.  Continue current regimen. 

## 2020-03-27 NOTE — Assessment & Plan Note (Signed)
Does not appear infected.  He will monitor for any signs of infection.

## 2020-03-27 NOTE — Assessment & Plan Note (Signed)
Improving fasting sugars.  He will continue his current regimen.  Check A1c.

## 2020-04-05 ENCOUNTER — Telehealth: Payer: Self-pay

## 2020-04-05 NOTE — Telephone Encounter (Signed)
-----   Message from Leone Haven, MD sent at 04/04/2020  1:25 PM EDT ----- Please let the patient know that his x-ray revealed degenerative changes.  Given his symptoms we could proceed with an MRI and physical therapy.  I can place orders for those if he is willing.

## 2020-04-05 NOTE — Telephone Encounter (Signed)
Pt was returning call 10:20

## 2020-04-21 ENCOUNTER — Encounter: Payer: Self-pay | Admitting: Family Medicine

## 2020-04-22 ENCOUNTER — Other Ambulatory Visit: Payer: Self-pay | Admitting: Family Medicine

## 2020-05-06 ENCOUNTER — Telehealth: Payer: BC Managed Care – PPO

## 2020-05-09 ENCOUNTER — Telehealth: Payer: BC Managed Care – PPO

## 2020-05-09 ENCOUNTER — Telehealth: Payer: Self-pay | Admitting: Pharmacist

## 2020-05-09 NOTE — Telephone Encounter (Signed)
  Chronic Care Management   Note  05/09/2020 Name: Jonathan West MRN: [id number] DOB: [birth year]   Attempted to contact patient for scheduled appointment for medication management support. Left HIPAA compliant message for patient to return my call at their convenience.    Plan: - If I do not hear back from the patient by end of business today, will collaborate with Care Guide to outreach to schedule follow up with me   [name], PharmD, [place], [place] Pharmacist [place] [phone]

## 2020-05-22 NOTE — Telephone Encounter (Signed)
Pt has been r/s for 07/18/2020

## 2020-05-26 ENCOUNTER — Other Ambulatory Visit: Payer: Self-pay | Admitting: Family Medicine

## 2020-05-26 DIAGNOSIS — E1165 Type 2 diabetes mellitus with hyperglycemia: Secondary | ICD-10-CM

## 2020-06-07 ENCOUNTER — Other Ambulatory Visit: Payer: Self-pay | Admitting: Family Medicine

## 2020-06-28 ENCOUNTER — Encounter: Payer: Self-pay | Admitting: Family Medicine

## 2020-06-28 ENCOUNTER — Other Ambulatory Visit: Payer: Self-pay

## 2020-06-28 ENCOUNTER — Ambulatory Visit (INDEPENDENT_AMBULATORY_CARE_PROVIDER_SITE_OTHER): Payer: BC Managed Care – PPO | Admitting: Family Medicine

## 2020-06-28 VITALS — BP 118/70 | HR 70 | Temp 98.6°F | Ht 70.0 in | Wt 180.4 lb

## 2020-06-28 DIAGNOSIS — Z23 Encounter for immunization: Secondary | ICD-10-CM | POA: Diagnosis not present

## 2020-06-28 DIAGNOSIS — E1165 Type 2 diabetes mellitus with hyperglycemia: Secondary | ICD-10-CM | POA: Diagnosis not present

## 2020-06-28 DIAGNOSIS — W19XXXA Unspecified fall, initial encounter: Secondary | ICD-10-CM | POA: Insufficient documentation

## 2020-06-28 DIAGNOSIS — I10 Essential (primary) hypertension: Secondary | ICD-10-CM | POA: Diagnosis not present

## 2020-06-28 DIAGNOSIS — G629 Polyneuropathy, unspecified: Secondary | ICD-10-CM | POA: Diagnosis not present

## 2020-06-28 NOTE — Assessment & Plan Note (Signed)
Mechanical fall with no initial injury though did develop some rib soreness later.  Rib discomfort has been improving and is mild.  He will monitor and if not resolving over the next 2 to 3 weeks he will let us know.

## 2020-06-28 NOTE — Patient Instructions (Signed)
Nice to see you. We will get lab work today and contact you with the results. If your ribs do not continue to improve please let me know. If you start to feel lightheaded more frequently please let us know as well.

## 2020-06-28 NOTE — Assessment & Plan Note (Signed)
Well-controlled.  Continue amlodipine 5 mg once daily, lisinopril 20 mg once daily, and propranolol 60 mg once daily.  Check labs.

## 2020-06-28 NOTE — Assessment & Plan Note (Signed)
Minimal symptoms.  Discussed monitoring his feet daily and letting us know if he develops any signs of nonhealing wounds or foot infections.  Could consider medication for this if it worsens.

## 2020-06-28 NOTE — Assessment & Plan Note (Addendum)
Undetermined control.  We will check an A1c.  Continue glimepiride 2 mg once daily, Basaglar 46 units once daily, Metformin 2000 mg once daily, and Steglatro 15 mg once daily.  I encouraged him to have his ophthalmology visit scheduled.

## 2020-06-28 NOTE — Progress Notes (Signed)
Jonathan Rumps, MD Phone: [phone]  Jonathan West is a 59 y.o. male who presents today for f/u.  HYPERTENSION  Disease Monitoring  Home BP Monitoring <130/80  Chest pain- no    Dyspnea- no Medications  Compliance-  Taking amlodipine, lisinopril, propranolol. Lightheadedness- rarely when rises from bending over Edema- no  DIABETES Disease Monitoring: Blood Sugar ranges-67-244 Polyuria/phagia/dipsia- no      Optho- due Medications: Compliance- taking glimepiride, Steglatro, Basaglar 46 units daily, and Metformin hypoglycemic symptoms-1 time, he felt dizzy, he ate something and improved Occasional tingling in his great toes, no other neuropathy symptoms.  Fall: He fell once as he tripped coming into the house.  He had no injury at the time.  No syncope.  About a week later his right ribs started to bother him.  They have been progressively improving.  They only bother him if he bends a certain way when he is in bed.     Social History   Tobacco Use  Smoking Status Never Smoker  Smokeless Tobacco Never Used     ROS see history of present illness  Objective  Physical Exam Vitals:   06/28/20 1503  BP: 118/70  Pulse: 70  Temp: 98.6 F (37 C)  SpO2: 98%    BP Readings from Last 3 Encounters:  06/28/20 118/70  03/27/20 118/70  12/25/19 120/70   Wt Readings from Last 3 Encounters:  06/28/20 180 lb 6.4 oz (81.8 kg)  03/27/20 184 lb 6.4 oz (83.6 kg)  12/25/19 182 lb 6.4 oz (82.7 kg)    Physical Exam Constitutional:      General: He is not in acute distress.    Appearance: He is not diaphoretic.  Cardiovascular:     Rate and Rhythm: Normal rate and regular rhythm.     Heart sounds: Normal heart sounds.  Pulmonary:     Effort: Pulmonary effort is normal.     Breath sounds: Normal breath sounds.  Musculoskeletal:     Right lower leg: No edema.     Left lower leg: No edema.     Comments: Slight tenderness over the right mid lateral ribs, no bony defects,  no skin changes, no flail chest  Skin:    General: Skin is warm and dry.  Neurological:     Mental Status: He is alert.    Diabetic Foot Exam - Simple   Simple Foot Form Diabetic Foot exam was performed with the following findings: Yes 06/28/2020  3:14 PM  Visual Inspection No deformities, no ulcerations, no other skin breakdown bilaterally: Yes Sensation Testing Intact to touch and monofilament testing bilaterally: Yes Pulse Check Posterior Tibialis and Dorsalis pulse intact bilaterally: Yes Comments      Assessment/Plan: Please see individual problem list.  Problem List Items Addressed This Visit    DM (diabetes mellitus), type 2, uncontrolled ([place])    Undetermined control.  We will check an A1c.  Continue glimepiride 2 mg once daily, Basaglar 46 units once daily, Metformin 2000 mg once daily, and Steglatro 15 mg once daily.  I encouraged him to have his ophthalmology visit scheduled.      Essential hypertension    Well-controlled.  Continue amlodipine 5 mg once daily, lisinopril 20 mg once daily, and propranolol 60 mg once daily.  Check labs.      Fall    Mechanical fall with no initial injury though did develop some rib soreness later.  Rib discomfort has been improving and is mild.  He will monitor and  if not resolving over the next 2 to 3 weeks he will let us know.      Neuropathy    Minimal symptoms.  Discussed monitoring his feet daily and letting us know if he develops any signs of nonhealing wounds or foot infections.  Could consider medication for this if it worsens.         This visit occurred during the SARS-CoV-2 public health emergency.  Safety protocols were in place, including screening questions prior to the visit, additional usage of staff PPE, and extensive cleaning of exam room while observing appropriate contact time as indicated for disinfecting solutions.    Jonathan Rumps, MD [place]

## 2020-06-29 LAB — LIPID PANEL
Cholesterol: 118 mg/dL (ref <200)
HDL: 33 mg/dL — ABNORMAL LOW (ref >=40)
LDL Cholesterol (Calc): 64 mg/dL (calc)
Non-HDL Cholesterol (Calc): 85 mg/dL (calc) (ref <130)
Total CHOL/HDL Ratio: 3.6 (calc) (ref <5.0)
Triglycerides: 126 mg/dL (ref <150)

## 2020-06-29 LAB — HEMOGLOBIN A1C
Hgb A1c MFr Bld: 8.7 % of total Hgb — ABNORMAL HIGH (ref <5.7)
Mean Plasma Glucose: 203 (calc)
eAG (mmol/L): 11.2 (calc)

## 2020-06-29 LAB — COMPREHENSIVE METABOLIC PANEL
AG Ratio: 1.6 (calc) (ref 1.0–2.5)
ALT: 16 U/L (ref 9–46)
AST: 14 U/L (ref 10–35)
Albumin: 4.4 g/dL (ref 3.6–5.1)
Alkaline phosphatase (APISO): 94 U/L (ref 35–144)
BUN: 15 mg/dL (ref 7–25)
CO2: 24 mmol/L (ref 20–32)
Calcium: 9.6 mg/dL (ref 8.6–10.3)
Chloride: 107 mmol/L (ref 98–110)
Creat: 1.17 mg/dL (ref 0.70–1.25)
Globulin: 2.7 g/dL (calc) (ref 1.9–3.7)
Glucose, Bld: 98 mg/dL (ref 65–99)
Potassium: 4.1 mmol/L (ref 3.5–5.3)
Sodium: 140 mmol/L (ref 135–146)
Total Bilirubin: 0.6 mg/dL (ref 0.2–1.2)
Total Protein: 7.1 g/dL (ref 6.1–8.1)

## 2020-07-01 ENCOUNTER — Other Ambulatory Visit: Payer: Self-pay | Admitting: Family Medicine

## 2020-07-01 DIAGNOSIS — I10 Essential (primary) hypertension: Secondary | ICD-10-CM

## 2020-07-01 DIAGNOSIS — E1165 Type 2 diabetes mellitus with hyperglycemia: Secondary | ICD-10-CM

## 2020-07-02 ENCOUNTER — Other Ambulatory Visit: Payer: Self-pay

## 2020-07-02 ENCOUNTER — Encounter: Payer: Self-pay | Admitting: Family Medicine

## 2020-07-02 DIAGNOSIS — I10 Essential (primary) hypertension: Secondary | ICD-10-CM

## 2020-07-02 DIAGNOSIS — E1165 Type 2 diabetes mellitus with hyperglycemia: Secondary | ICD-10-CM

## 2020-07-02 MED ORDER — STEGLATRO 15 MG PO TABS: ORAL_TABLET | ORAL | 0 refills | Status: DC

## 2020-07-02 MED ORDER — AMLODIPINE BESYLATE 5 MG PO TABS: 5.0000 mg | ORAL_TABLET | Freq: Every day | ORAL | 0 refills | Status: DC

## 2020-07-04 ENCOUNTER — Other Ambulatory Visit: Payer: Self-pay | Admitting: Family Medicine

## 2020-07-04 MED ORDER — OZEMPIC (0.25 OR 0.5 MG/DOSE) 2 MG/1.5ML ~~LOC~~ SOPN: PEN_INJECTOR | SUBCUTANEOUS | 1 refills | Status: DC

## 2020-07-18 ENCOUNTER — Ambulatory Visit: Payer: BC Managed Care – PPO | Admitting: Pharmacist

## 2020-07-18 DIAGNOSIS — I10 Essential (primary) hypertension: Secondary | ICD-10-CM

## 2020-07-18 DIAGNOSIS — E782 Mixed hyperlipidemia: Secondary | ICD-10-CM

## 2020-07-18 DIAGNOSIS — E1165 Type 2 diabetes mellitus with hyperglycemia: Secondary | ICD-10-CM

## 2020-07-18 NOTE — Chronic Care Management (AMB) (Signed)
Chronic Care Management   Note  07/18/2020 Name: Jonathan West MRN: [id number] DOB: October 06, 1960   Subjective:  Jonathan West is a 59 y.o. year old male who is a primary care patient of Caryl Bis, Angela Adam, MD. The CCM team was consulted for assistance with chronic disease management and care coordination needs.    Connected with patient by telephone for follow up in response to provider referral for pharmacy case management and/or care coordination services.    Review of patient status, including review of consultants reports, laboratory and other test data, was performed as part of comprehensive evaluation and provision of chronic care management services.   SDOH (Social Determinants of Health) assessments and interventions performed:  SDOH Interventions     Most Recent Value  SDOH Interventions  Financial Strain Interventions Other (Comment)  [manufacturer savings cards]       Objective:  Lab Results  Component Value Date   CREATININE 1.17 06/28/2020   CREATININE 1.28 12/25/2019   CREATININE 1.26 09/18/2019    Lab Results  Component Value Date   HGBA1C 8.7 (H) 06/28/2020       Component Value Date/Time   CHOL 118 06/28/2020 1515   TRIG 126 06/28/2020 1515   HDL 33 (L) 06/28/2020 1515   CHOLHDL 3.6 06/28/2020 1515   VLDL 21.4 06/14/2019 1134   LDLCALC 64 06/28/2020 1515   LDLDIRECT 58.0 09/13/2017 1232    BP Readings from Last 3 Encounters:  06/28/20 118/70  03/27/20 118/70  12/25/19 120/70    Assessment:   Allergies  Allergen Reactions  . Metformin Other (See Comments)    Indigestion with immediate release    Medications Reviewed Today    Reviewed by De Hollingshead, RPH-CPP (Pharmacist) on 07/18/20 at 1540  Med List Status: <None>  Medication Order Taking? Sig Documenting Provider Last Dose Status Informant  amLODipine (NORVASC) 5 MG tablet [id number] Yes Take 1 tablet (5 mg total) by mouth daily. Leone Haven, MD Taking Active     aspirin EC 81 MG tablet [id number] Yes Take 81 mg by mouth daily. [provider] Taking Active   atorvastatin (LIPITOR) 10 MG tablet [id number] Yes TAKE 1 TABLET BY MOUTH EVERY DAY Leone Haven, MD Taking Active   blood glucose meter kit and supplies [id number] Yes Dispense based on patient and insurance preference. Use up to four times daily as directed. (FOR ICD-9 250.00, 250.01). Leone Haven, MD Taking Active   ertugliflozin L-PyroglutamicAc [facility]) 15 MG TABS tablet [id number] Yes TAKE 1 TABLET BY MOUTH ONCE DAILY BEFORE BREAKFAST Leone Haven, MD Taking Active   gabapentin (NEURONTIN) 300 MG capsule [id number] Yes Take 3 capsules (900 mg total) by mouth at bedtime. Leone Haven, MD Taking Active   glimepiride [employer] [employer]) 2 MG tablet [id number] Yes Take 1 tablet by mouth once daily with breakfast Leone Haven, MD Taking Active   glucose blood test strip [id number] Yes CHECK BLOOD SUGAR 3 TIMES  DAILY Leone Haven, MD Taking Active   Insulin Glargine [facility] KWIKPEN) 100 UNIT/ML [id number] Yes Inject 46 units daily. Titrate as instructed. Max daily dose 60 units daily Leone Haven, MD Taking Active   Insulin Pen Needle (PEN NEEDLES) 32G X 4 MM MISC [id number] Yes Inject 1 pen into the skin daily. Use to inject insulin daily. Leone Haven, MD Taking Active   lisinopril (ZESTRIL) 20 MG tablet [id number] Yes Take 2 tablets by mouth once daily Leone Haven, MD Taking Active  metFORMIN (GLUCOPHAGE-XR) 500 MG 24 hr tablet [id number] Yes TAKE 4 TABLETS BY MOUTH ONCE DAILY WITH BREAKFAST Leone Haven, MD Taking Active   [facility] LANCETS [address] [place] [id number]  Check blood glucose three times daily DX: E11.65 Leone Haven, MD  Active   propranolol ER (INDERAL LA) 60 MG 24 hr capsule [id number] Yes Take 1 capsule (60 mg total) by mouth daily. Leone Haven, MD Taking Active   Semaglutide,0.25 or 0.5MG/DOS, (OZEMPIC, 0.25 OR 0.5  MG/DOSE,) 2 MG/1.5ML SOPN [id number] No Inject 0.25 mg into the skin once a week for 28 days, THEN 0.5 mg once a week.  Patient not taking: Reported on 07/18/2020   Leone Haven, MD Not Taking Active   Vitamin D, Ergocalciferol, 2000 units CAPS [id number] Yes Take by mouth daily. [provider] Taking Active           Patient Care Plan: General Plan of Care (Adult)    Problem Identified: Disease Management     Long-Range Goal: Disease Progression Prevention   This Visit's Progress: Not on track  Priority: High  Note:   Current Barriers:  . Unable to independently afford treatment regimen . Unable to achieve control of diabetes   Pharmacist Clinical Goal(s):  Marland Kitchen Over the next 90 days, patient will work with PharmD and provider towards optimized glycemic control  Review and Interventions  Medication Management . Comprehensive medication review performed; medication list updated in electronic medical record  Diabetes: . Uncontrolled; current treatment: metformin 1000 mg BID, Steglatro 15 mg daily, glimepiride 2 mg QAM, Basaglar 46 units daily, prescribed Ozempic, but cost is >$1600, patient has a very high deductible plan, but insulin coverage is manageable;  . Peripheral neuropathy: gabapentin 900 mg QPM . Current glucose readings: fasting glucose: 115-145, not checking post prandial glucose . Current meal patterns: breakfast: egg, cheese, sometimes biscuit; lunch: usually pack of crackers, sometimes nothing; candy bar ; supper: cubed steak, grilled chicken w/ pasta, crowder peas, potatoes, burgers on the grill w/ cheese and bun; fries, pizza; drinks: tea, unsweet tea; diet coke  . Current exercise: nothing organized; notes work has been too busy recently to focus on self-care . Educated on goal A1c, goal fasting, and goal 2 hour post prandial glucose. Educated on macro and microvascular complications of diabetes.  . Discussed that post-prandial glucoses (from high  carbohydrate meals) likely contributing to continued A1c elevation. Likely will need mealtime insulin w/ meals, but not enough data right now to tell which meals.  . Recommended consideration of CGM. Unlikely that insurance plan will pay for it, but discussed cash price of $75/month. Discussed that meal choices are significantly contributing to A1c. Scheduled for nurse visit for teaching and sample FreeStyle [place] 2 CGM, patient will use his phone as reader. Will utilize data to determine next pharmacologic steps  Hypertension: . Controlled; current treatment: amlodipine 5 mg QAM, lisinopril 40 mg QAM, propranolol 60 mg daily;  . Recommended continuation of current regimen  Hyperlipidemia: . Controlled; current treatment: atorvastatin 10 mg daily;  . Concurrent aspirin 81 mg daily for ASCVD risk reduction . Recommended continuation of current regimen   Inter-disciplinary care team collaboration (see longitudinal plan of care)  Patient Goals/Self-Care Activities . Over the next 30 days, patient will:  - check glucose using CGM, document, and provide at future appointments - collaborate with provider on medication access solutions  Follow Up Plan: Telephone follow up appointment with care management team member scheduled for: ~3 weeks (patient will  wear CGM after nurse visit for 2 weeks)     [name], PharmD, [place], CPP Clinical Pharmacist [place] [phone]

## 2020-07-18 NOTE — Patient Instructions (Signed)
Visit Information Patient Care Plan: General Plan of Care (Adult)    Problem Identified: Disease Management     Long-Range Goal: Disease Progression Prevention   This Visit's Progress: Not on track  Priority: High  Note:   Current Barriers:  . Unable to independently afford treatment regimen . Unable to achieve control of diabetes   Pharmacist Clinical Goal(s):  Marland Kitchen Over the next 90 days, patient will work with PharmD and provider towards optimized glycemic control  Review and Interventions  Medication Management . Comprehensive medication review performed; medication list updated in electronic medical record  Diabetes: . Uncontrolled; current treatment: metformin 1000 mg BID, Steglatro 15 mg daily, glimepiride 2 mg QAM, Basaglar 46 units daily, prescribed Ozempic, but cost is >$1600, patient has a very high deductible plan, but insulin coverage is manageable;  . Peripheral neuropathy: gabapentin 900 mg QPM . Current glucose readings: fasting glucose: 115-145, not checking post prandial glucose . Current meal patterns: breakfast: egg, cheese, sometimes biscuit; lunch: usually pack of crackers, sometimes nothing; candy bar ; supper: cubed steak, grilled chicken w/ pasta, crowder peas, potatoes, burgers on the grill w/ cheese and bun; fries, pizza; drinks: tea, unsweet tea; diet coke  . Current exercise: nothing organized; notes work has been too busy recently to focus on self-care . Educated on goal A1c, goal fasting, and goal 2 hour post prandial glucose. Educated on macro and microvascular complications of diabetes.  . Discussed that post-prandial glucoses (from high carbohydrate meals) likely contributing to continued A1c elevation. Likely will need mealtime insulin w/ meals, but not enough data right now to tell which meals.  . Recommended consideration of CGM. Unlikely that insurance plan will pay for it, but discussed cash price of $75/month. Discussed that meal choices are  significantly contributing to A1c. Scheduled for nurse visit for teaching and sample FreeStyle [place] 2 CGM, patient will use his phone as reader. Will utilize data to determine next pharmacologic steps  Hypertension: . Controlled; current treatment: amlodipine 5 mg QAM, lisinopril 40 mg QAM, propranolol 60 mg daily;  . Recommended continuation of current regimen  Hyperlipidemia: . Controlled; current treatment: atorvastatin 10 mg daily;  . Concurrent aspirin 81 mg daily for ASCVD risk reduction . Recommended continuation of current regimen   Inter-disciplinary care team collaboration (see longitudinal plan of care)  Patient Goals/Self-Care Activities . Over the next 30 days, patient will:  - check glucose using CGM, document, and provide at future appointments - collaborate with provider on medication access solutions  Follow Up Plan: Telephone follow up appointment with care management team member scheduled for: ~3 weeks (patient will wear CGM after nurse visit for 2 weeks)      The patient verbalized understanding of instructions provided today and declined a print copy of patient instruction materials.  [name], PharmD, [place], CPP Clinical Pharmacist [place] [phone]

## 2020-07-21 ENCOUNTER — Encounter: Payer: Self-pay | Admitting: Family Medicine

## 2020-07-21 ENCOUNTER — Other Ambulatory Visit: Payer: Self-pay | Admitting: Family Medicine

## 2020-07-22 ENCOUNTER — Other Ambulatory Visit: Payer: Self-pay

## 2020-07-22 MED ORDER — LISINOPRIL 20 MG PO TABS
40.0000 mg | ORAL_TABLET | Freq: Every day | ORAL | 0 refills | Status: DC
Start: 2020-07-22 — End: 2020-10-20

## 2020-07-22 MED ORDER — GABAPENTIN 300 MG PO CAPS
900.0000 mg | ORAL_CAPSULE | Freq: Every day | ORAL | 2 refills | Status: DC
Start: 2020-07-22 — End: 2020-11-01

## 2020-07-25 ENCOUNTER — Ambulatory Visit (INDEPENDENT_AMBULATORY_CARE_PROVIDER_SITE_OTHER): Payer: BC Managed Care – PPO

## 2020-07-25 ENCOUNTER — Other Ambulatory Visit: Payer: Self-pay

## 2020-07-25 DIAGNOSIS — E1165 Type 2 diabetes mellitus with hyperglycemia: Secondary | ICD-10-CM

## 2020-07-25 NOTE — Progress Notes (Signed)
Patient presented for [facility] placement and instruction. Per Providers order from 07-18-20. Patient voiced no concerns nor showed signs of distress during placement. Also instructed patient on how to record and give access to [place] view so we can have accurate monitoring system. Instructed patient that if further questions, they can call clinic to be given further direction.

## 2020-08-09 ENCOUNTER — Ambulatory Visit: Payer: BC Managed Care – PPO | Admitting: Pharmacist

## 2020-08-09 DIAGNOSIS — I10 Essential (primary) hypertension: Secondary | ICD-10-CM

## 2020-08-09 DIAGNOSIS — E782 Mixed hyperlipidemia: Secondary | ICD-10-CM

## 2020-08-09 DIAGNOSIS — E1165 Type 2 diabetes mellitus with hyperglycemia: Secondary | ICD-10-CM

## 2020-08-09 MED ORDER — FREESTYLE LIBRE 2 SENSOR MISC: 11 refills | Status: DC

## 2020-08-09 MED ORDER — GLIMEPIRIDE 2 MG PO TABS: 2.0000 mg | ORAL_TABLET | Freq: Two times a day (BID) | ORAL | 1 refills | Status: DC

## 2020-08-09 NOTE — Patient Instructions (Signed)
Visit Information  Patient Care Plan: General Plan of Care (Adult)    Problem Identified: Disease Management     Long-Range Goal: Disease Progression Prevention   This Visit's Progress: On track  Recent Progress: Not on track  Priority: High  Note:   Current Barriers:  . Unable to independently afford treatment regimen . Unable to achieve control of diabetes   Pharmacist Clinical Goal(s):  Marland Kitchen Over the next 90 days, patient will work with PharmD and provider towards optimized glycemic control  Review and Interventions  Medication Management . Comprehensive medication review performed; medication list updated in electronic medical record . Inter-disciplinary care team collaboration (see longitudinal plan of care)  Diabetes: . Uncontrolled; current treatment: metformin 1000 mg BID, Steglatro 15 mg daily, glimepiride 2 mg QAM, Basaglar 46 units daily . Peripheral neuropathy: gabapentin 900 mg QPM . Current glucose readings: fasting glucose:  Date of Download: 11/20-12/3/21 % Time CGM is active: 68% Average Glucose: 177 mg/dL Glucose Management Indicator: 7.5  Glucose Variability:  29.9 (goal <36%) Time in Goal:  - Time in range 70-180: 59% - Time above range: 41% - Time below range: 0% Observed patterns: post supper elevations persisting throughout the evening . Reviewed CGM data. Increase glimepiride to 2 mg BID. Continue metformin 2000 mg daily, Steglatro 15 mg daily, Basaglar 46 units daily. Counseled to contact me if development of overnight hypoglycemia . Discussed how to silence high glucose alarms . Submitted PA for [place] 2. Approved. Script sent to patient's pharmacy. Patient notified   Hypertension: . Controlled; current treatment: amlodipine 5 mg QAM, lisinopril 40 mg QAM, propranolol 60 mg daily;  . Recommended continuation of current regimen  Hyperlipidemia: . Controlled; current treatment: atorvastatin 10 mg daily;  . Concurrent aspirin 81 mg daily for ASCVD  risk reduction . Recommended continuation of current regimen   Patient Goals/Self-Care Activities . Over the next 90 days, patient will:  - check glucose using CGM, document, and provide at future appointments - collaborate with provider on medication access solutions  Follow Up Plan: Telephone follow up appointment with care management team member scheduled for: ~5 weeks      The patient verbalized understanding of instructions, educational materials, and care plan provided today and declined offer to receive copy of patient instructions, educational materials, and care plan.   Plan: Telephone follow up appointment with care management team member scheduled for:  ~ 5 weeks  [name], PharmD, [place], [place] Pharmacist [place] [phone]

## 2020-08-09 NOTE — Chronic Care Management (AMB) (Signed)
Care Management   Pharmacy Note  08/09/2020 Name: Jonathan West MRN: [id number] DOB: [birth year]   Subjective:  Jonathan West is a 59 y.o. year old male who is a primary care patient of Caryl Bis, Angela Adam, MD. The Care Management/Care Coordination team team was consulted for assistance with care management and care coordination needs.    Engaged with patient by telephone for follow up visit in response to provider referral for pharmacy case management and/or care coordination services.   Consent to Services:  Mr. Funari was given information about care management/care coordination services, agreed to services, and gave verbal consent prior to initiation of services. Please see initial visit note for detailed documentation.   Review of patient status, including review of consultants reports, laboratory and other test data, was performed as part of comprehensive evaluation and provision of chronic care management services.   SDOH (Social Determinants of Health) assessments and interventions performed:  SDOH Interventions     Most Recent Value  SDOH Interventions  Financial Strain Interventions Other (Comment)  [medication access conversations]       Objective:  Lab Results  Component Value Date   CREATININE 1.17 06/28/2020   CREATININE 1.28 12/25/2019   CREATININE 1.26 09/18/2019    Lab Results  Component Value Date   HGBA1C 8.7 (H) 06/28/2020       Component Value Date/Time   CHOL 118 06/28/2020 1515   TRIG 126 06/28/2020 1515   HDL 33 (L) 06/28/2020 1515   CHOLHDL 3.6 06/28/2020 1515   VLDL 21.4 06/14/2019 1134   LDLCALC 64 06/28/2020 1515   LDLDIRECT 58.0 09/13/2017 1232     BP Readings from Last 3 Encounters:  06/28/20 118/70  03/27/20 118/70  12/25/19 120/70    Assessment:   Allergies  Allergen Reactions  . Metformin Other (See Comments)    Indigestion with immediate release    Medications Reviewed Today    Reviewed by De Hollingshead,  RPH-CPP (Pharmacist) on 08/09/20 at 1533  Med List Status: <None>  Medication Order Taking? Sig Documenting Provider Last Dose Status Informant  amLODipine (NORVASC) 5 MG tablet [id number] Yes Take 1 tablet (5 mg total) by mouth daily. Leone Haven, MD Taking Active   aspirin EC 81 MG tablet [id number] Yes Take 81 mg by mouth daily. [provider] Taking Active   atorvastatin (LIPITOR) 10 MG tablet [id number] Yes TAKE 1 TABLET BY MOUTH EVERY DAY Leone Haven, MD Taking Active   blood glucose meter kit and supplies [id number] Yes Dispense based on patient and insurance preference. Use up to four times daily as directed. (FOR ICD-9 250.00, 250.01). Leone Haven, MD Taking Active   ertugliflozin L-PyroglutamicAc [facility]) 15 MG TABS tablet [id number] Yes TAKE 1 TABLET BY MOUTH ONCE DAILY BEFORE BREAKFAST Leone Haven, MD Taking Active   gabapentin (NEURONTIN) 300 MG capsule [id number] Yes Take 3 capsules (900 mg total) by mouth at bedtime. Leone Haven, MD Taking Active   glimepiride Jari [employer]man) 2 MG tablet [id number] Yes Take 1 tablet by mouth once daily with breakfast Leone Haven, MD Taking Active   glucose blood test strip [id number] Yes CHECK BLOOD SUGAR 3 TIMES  DAILY Leone Haven, MD Taking Active   Insulin Glargine [facility] KWIKPEN) 100 UNIT/ML [id number] Yes Inject 46 units daily. Titrate as instructed. Max daily dose 60 units daily Leone Haven, MD Taking Active   Insulin Pen Needle (PEN NEEDLES) 32G X 4 MM MISC [id number] Yes Inject 1 pen  into the skin daily. Use to inject insulin daily. Leone Haven, MD Taking Active   lisinopril (ZESTRIL) 20 MG tablet [id number] Yes Take 2 tablets (40 mg total) by mouth daily. Leone Haven, MD Taking Active   metFORMIN (GLUCOPHAGE-XR) 500 MG 24 hr tablet [id number] Yes TAKE 4 TABLETS BY MOUTH ONCE DAILY WITH BREAKFAST Leone Haven, MD Taking Active   [facility] LANCETS [address] [place] [id number] Yes  Check blood glucose three times daily DX: E11.65 Leone Haven, MD Taking Active   propranolol ER (INDERAL LA) 60 MG 24 hr capsule [id number] Yes Take 1 capsule (60 mg total) by mouth daily. Leone Haven, MD Taking Active        Patient not taking:      Discontinued 08/09/20 1533 (Completed Course)   Vitamin D, Ergocalciferol, 2000 units CAPS [id number] Yes Take by mouth daily. [provider] Taking Active           Patient Active Problem List   Diagnosis Date Noted  . Fall 06/28/2020  . Radiculopathy 03/27/2020  . Scalp abrasion 03/27/2020  . Muscle cramps 12/25/2019  . Neuropathy 09/18/2019  . Multiple nevi 02/09/2018  . Hyperlipidemia 11/11/2017  . History of anemia 08/11/2017  . Lipoma 05/22/2015  . Essential hypertension 06/30/2014  . Erectile dysfunction associated with type 2 diabetes mellitus ([place]) 03/27/2014  . DM (diabetes mellitus), type 2, uncontrolled ([place]) 11/09/2013  . B12 deficiency 11/09/2013  . Vitamin D deficiency 11/09/2013    Medication Assistance: high deductible insurance plan makes it difficult to afford most brand name medications  Patient Care Plan: General Plan of Care (Adult)    Problem Identified: Disease Management     Long-Range Goal: Disease Progression Prevention   This Visit's Progress: On track  Recent Progress: Not on track  Priority: High  Note:   Current Barriers:  . Unable to independently afford treatment regimen . Unable to achieve control of diabetes   Pharmacist Clinical Goal(s):  Marland Kitchen Over the next 90 days, patient will work with PharmD and provider towards optimized glycemic control  Review and Interventions  Medication Management . Comprehensive medication review performed; medication list updated in electronic medical record . Inter-disciplinary care team collaboration (see longitudinal plan of care)  Diabetes: . Uncontrolled; current treatment: metformin 1000 mg BID, Steglatro 15 mg daily,  glimepiride 2 mg QAM, Basaglar 46 units daily . Peripheral neuropathy: gabapentin 900 mg QPM . Current glucose readings: fasting glucose:  Date of Download: 11/20-12/3/21 % Time CGM is active: 68% Average Glucose: 177 mg/dL Glucose Management Indicator: 7.5  Glucose Variability:  29.9 (goal <36%) Time in Goal:  - Time in range 70-180: 59% - Time above range: 41% - Time below range: 0% Observed patterns: post supper elevations persisting throughout the evening . Reviewed CGM data. Increase glimepiride to 2 mg BID. Continue metformin 2000 mg daily, Steglatro 15 mg daily, Basaglar 46 units daily. Counseled to contact me if development of overnight hypoglycemia . Discussed how to silence high glucose alarms . Submitted PA for [place] 2. Approved. Script sent to patient's pharmacy. Patient notified   Hypertension: . Controlled; current treatment: amlodipine 5 mg QAM, lisinopril 40 mg QAM, propranolol 60 mg daily;  . Recommended continuation of current regimen  Hyperlipidemia: . Controlled; current treatment: atorvastatin 10 mg daily;  . Concurrent aspirin 81 mg daily for ASCVD risk reduction . Recommended continuation of current regimen   Patient Goals/Self-Care Activities . Over the next 90 days, patient will:  - check  glucose using CGM, document, and provide at future appointments - collaborate with provider on medication access solutions  Follow Up Plan: Telephone follow up appointment with care management team member scheduled for: ~5 weeks       Plan: Telephone follow up appointment with care management team member scheduled for:  ~ 5 weeks  [name], PharmD, [place], [place] Pharmacist [place] [phone]

## 2020-08-11 DIAGNOSIS — B9689 Other specified bacterial agents as the cause of diseases classified elsewhere: Secondary | ICD-10-CM | POA: Diagnosis not present

## 2020-08-11 DIAGNOSIS — J208 Acute bronchitis due to other specified organisms: Secondary | ICD-10-CM | POA: Diagnosis not present

## 2020-08-11 DIAGNOSIS — Z03818 Encounter for observation for suspected exposure to other biological agents ruled out: Secondary | ICD-10-CM | POA: Diagnosis not present

## 2020-08-13 MED ORDER — AMLODIPINE BESYLATE 5 MG PO TABS: 5.0000 mg | ORAL_TABLET | Freq: Every day | ORAL | 1 refills | Status: DC

## 2020-08-13 MED ORDER — STEGLATRO 15 MG PO TABS: ORAL_TABLET | ORAL | 1 refills | Status: DC

## 2020-09-09 ENCOUNTER — Other Ambulatory Visit: Payer: Self-pay | Admitting: Family Medicine

## 2020-09-12 ENCOUNTER — Ambulatory Visit: Payer: BC Managed Care – PPO | Admitting: Pharmacist

## 2020-09-12 DIAGNOSIS — E1165 Type 2 diabetes mellitus with hyperglycemia: Secondary | ICD-10-CM

## 2020-09-12 DIAGNOSIS — E782 Mixed hyperlipidemia: Secondary | ICD-10-CM

## 2020-09-12 DIAGNOSIS — I10 Essential (primary) hypertension: Secondary | ICD-10-CM

## 2020-09-12 MED ORDER — STEGLATRO 15 MG PO TABS: ORAL_TABLET | ORAL | 11 refills | Status: DC

## 2020-09-12 MED ORDER — BASAGLAR KWIKPEN 100 UNIT/ML ~~LOC~~ SOPN: PEN_INJECTOR | SUBCUTANEOUS | 3 refills | Status: DC

## 2020-09-12 NOTE — Patient Instructions (Signed)
Visit Information  Patient Care Plan: General Plan of Care (Adult)    Problem Identified: Disease Management     Long-Range Goal: Disease Progression Prevention   This Visit's Progress: On track  Recent Progress: On track  Priority: High  Note:   Current Barriers:  . Unable to independently afford treatment regimen . Unable to achieve control of diabetes   Pharmacist Clinical Goal(s):  Marland Kitchen Over the next 90 days, patient will work with PharmD and provider towards optimized glycemic control  Review and Interventions  Medication Management . Comprehensive medication review performed; medication list updated in electronic medical record . Inter-disciplinary care team collaboration (see longitudinal plan of care)  Diabetes: . Uncontrolled; current treatment: metformin 1000 mg BID, Steglatro 15 mg daily, glimepiride 2 mg QAM, Basaglar 46 units daily . Peripheral neuropathy: gabapentin 900 mg QPM . Current glucose readings: fasting glucose:  Date of Download: 08/30/20-09/12/20 % Time CGM is active: 49% Average Glucose: 189 mg/dL Time in Goal:  - Time in range 70-180:40% - Time above range: 37% - Time below range: 3% Observed patterns: too little data to interpret.  . Reviewed CGM. Patient notes he has not been taking glimepiride with supper regularly, as his supper times vary. Reviewed need to take 15-30 minutes before meals for optimal benefit. Continue metformin 1000 mg BID, Steglatro 15 mg daily, glimepiride 2 mg BID (with focus of taking regularly) for now. Discussed that he had 2 episodes of mid-morning hypoglycemia. Discussed that if this becomes a pattern to reduce AM glimepiride to 1 mg.  . Requested refill on Basaglar and Steglatro to the pharmacy.   Hypertension: . Controlled per last clinic reading; current treatment: amlodipine 5 mg QAM, lisinopril 40 mg QAM, propranolol 60 mg daily;  . Recommended continuation of current regimen  Hyperlipidemia: . Controlled; current  treatment: atorvastatin 10 mg daily;  . Concurrent aspirin 81 mg daily for ASCVD risk reduction . Recommended continuation of current regimen   Patient Goals/Self-Care Activities . Over the next 90 days, patient will:  - check glucose using CGM, document, and provide at future appointments - collaborate with provider on medication access solutions  Follow Up Plan: Telephone follow up appointment with care management team member scheduled for: ~4 weeks      The patient verbalized understanding of instructions, educational materials, and care plan provided today and declined offer to receive copy of patient instructions, educational materials, and care plan.   Plan: Telephone follow up appointment with care management team member scheduled for: ~ 4 weeks  Catie Feliz Beam, PharmD, [place], CPP Clinical Pharmacist [employer] at [employer] [phone]

## 2020-09-12 NOTE — Chronic Care Management (AMB) (Signed)
Care Management   Pharmacy Note  09/12/2020 Name: Jonathan West MRN: [id number] DOB: [birth year]  Jonathan West is a 60 y.o. year old male who is a primary care patient of Caryl Bis, Angela Adam, MD. The Care Management/Care Coordination team team was consulted for assistance with care management and care coordination needs.    Engaged with patient by telephone for follow up visit in response to provider referral for pharmacy case management and/or care coordination services.   Consent to Services:  Patient was given information about care management/care coordination services, agreed to services, and gave verbal consent prior to initiation of services. Please see initial visit note for detailed documentation.   Review of patient status, including review of consultants reports, laboratory and other test data, was performed as part of comprehensive evaluation and provision of chronic care management services.   SDOH (Social Determinants of Health) assessments and interventions performed: none today   Objective:  Lab Results  Component Value Date   CREATININE 1.17 06/28/2020   CREATININE 1.28 12/25/2019   CREATININE 1.26 09/18/2019    Lab Results  Component Value Date   HGBA1C 8.7 (H) 06/28/2020       Component Value Date/Time   CHOL 118 06/28/2020 1515   TRIG 126 06/28/2020 1515   HDL 33 (L) 06/28/2020 1515   CHOLHDL 3.6 06/28/2020 1515   VLDL 21.4 06/14/2019 1134   LDLCALC 64 06/28/2020 1515   LDLDIRECT 58.0 09/13/2017 1232    BP Readings from Last 3 Encounters:  06/28/20 118/70  03/27/20 118/70  12/25/19 120/70         Care Plan  Allergies  Allergen Reactions  . Metformin Other (See Comments)    Indigestion with immediate release    Medications Reviewed Today    Reviewed by De Hollingshead, RPH-CPP (Pharmacist) on 08/09/20 at 1533  Med List Status: <None>  Medication Order Taking? Sig Documenting Provider Last Dose Status Informant  amLODipine  (NORVASC) 5 MG tablet [id number] Yes Take 1 tablet (5 mg total) by mouth daily. Leone Haven, MD Taking Active   aspirin EC 81 MG tablet [id number] Yes Take 81 mg by mouth daily. [provider] Taking Active   atorvastatin (LIPITOR) 10 MG tablet [id number] Yes TAKE 1 TABLET BY MOUTH EVERY DAY Leone Haven, MD Taking Active   blood glucose meter kit and supplies [id number] Yes Dispense based on patient and insurance preference. Use up to four times daily as directed. (FOR ICD-9 250.00, 250.01). Leone Haven, MD Taking Active   ertugliflozin L-PyroglutamicAc [facility]) 15 MG TABS tablet [id number] Yes TAKE 1 TABLET BY MOUTH ONCE DAILY BEFORE BREAKFAST Leone Haven, MD Taking Active   gabapentin (NEURONTIN) 300 MG capsule [id number] Yes Take 3 capsules (900 mg total) by mouth at bedtime. Leone Haven, MD Taking Active   glimepiride Jari Sportsman) 2 MG tablet [id number] Yes Take 1 tablet by mouth once daily with breakfast Leone Haven, MD Taking Active   glucose blood test strip [id number] Yes CHECK BLOOD SUGAR 3 TIMES  DAILY Leone Haven, MD Taking Active   Insulin Glargine [facility] KWIKPEN) 100 UNIT/ML [id number] Yes Inject 46 units daily. Titrate as instructed. Max daily dose 60 units daily Leone Haven, MD Taking Active   Insulin Pen Needle (PEN NEEDLES) 32G X 4 MM MISC [id number] Yes Inject 1 pen into the skin daily. Use to inject insulin daily. Leone Haven, MD Taking Active   lisinopril (ZESTRIL) 20 MG tablet [id number] Yes Take  2 tablets (40 mg total) by mouth daily. Leone Haven, MD Taking Active   metFORMIN (GLUCOPHAGE-XR) 500 MG 24 hr tablet [id number] Yes TAKE 4 TABLETS BY MOUTH ONCE DAILY WITH BREAKFAST Leone Haven, MD Taking Active   [facility] LANCETS [address] [place] [id number] Yes Check blood glucose three times daily DX: E11.65 Leone Haven, MD Taking Active   propranolol ER (INDERAL LA) 60 MG 24 hr capsule [id number] Yes Take  1 capsule (60 mg total) by mouth daily. Leone Haven, MD Taking Active        Patient not taking:      Discontinued 08/09/20 1533 (Completed Course)   Vitamin D, Ergocalciferol, 2000 units CAPS [id number] Yes Take by mouth daily. [provider] Taking Active           Patient Active Problem List   Diagnosis Date Noted  . Fall 06/28/2020  . Radiculopathy 03/27/2020  . Scalp abrasion 03/27/2020  . Muscle cramps 12/25/2019  . Neuropathy 09/18/2019  . Multiple nevi 02/09/2018  . Hyperlipidemia 11/11/2017  . History of anemia 08/11/2017  . Lipoma 05/22/2015  . Essential hypertension 06/30/2014  . Erectile dysfunction associated with type 2 diabetes mellitus ([place]) 03/27/2014  . DM (diabetes mellitus), type 2, uncontrolled ([place]) 11/09/2013  . B12 deficiency 11/09/2013  . Vitamin D deficiency 11/09/2013    Conditions to be addressed/monitored: HTN, HLD and DM  Patient Care Plan: General Plan of Care (Adult)    Problem Identified: Disease Management     Long-Range Goal: Disease Progression Prevention   This Visit's Progress: On track  Recent Progress: On track  Priority: High  Note:   Current Barriers:  . Unable to independently afford treatment regimen . Unable to achieve control of diabetes   Pharmacist Clinical Goal(s):  Marland Kitchen Over the next 90 days, patient will work with PharmD and provider towards optimized glycemic control  Review and Interventions  Medication Management . Comprehensive medication review performed; medication list updated in electronic medical record . Inter-disciplinary care team collaboration (see longitudinal plan of care)  Diabetes: . Uncontrolled; current treatment: metformin 1000 mg BID, Steglatro 15 mg daily, glimepiride 2 mg QAM, Basaglar 46 units daily . Peripheral neuropathy: gabapentin 900 mg QPM . Current glucose readings: fasting glucose:  Date of Download: 08/30/20-09/12/20 % Time CGM is active: 49% Average Glucose: 189  mg/dL Time in Goal:  - Time in range 70-180:40% - Time above range: 37% - Time below range: 3% Observed patterns: too little data to interpret.  . Reviewed CGM. Patient notes he has not been taking glimepiride with supper regularly, as his supper times vary. Reviewed need to take 15-30 minutes before meals for optimal benefit. Continue metformin 1000 mg BID, Steglatro 15 mg daily, glimepiride 2 mg BID (with focus of taking regularly) for now. Discussed that he had 2 episodes of mid-morning hypoglycemia. Discussed that if this becomes a pattern to reduce AM glimepiride to 1 mg.  . Requested refill on Basaglar and Steglatro to the pharmacy.   Hypertension: . Controlled per last clinic reading; current treatment: amlodipine 5 mg QAM, lisinopril 40 mg QAM, propranolol 60 mg daily;  . Recommended continuation of current regimen  Hyperlipidemia: . Controlled; current treatment: atorvastatin 10 mg daily;  . Concurrent aspirin 81 mg daily for ASCVD risk reduction . Recommended continuation of current regimen   Patient Goals/Self-Care Activities . Over the next 90 days, patient will:  - check glucose using CGM, document, and provide at future appointments - collaborate with  provider on medication access solutions  Follow Up Plan: Telephone follow up appointment with care management team member scheduled for: ~4 weeks      Medication Assistance: None required. Patient affirms current coverage meets needs.   Plan: Telephone follow up appointment with care management team member scheduled for: ~ 4 weeks  [name], PharmD, [place], [place] Clinical Pharmacist [employer] at [employer] [phone]

## 2020-10-08 ENCOUNTER — Ambulatory Visit: Payer: BC Managed Care – PPO | Admitting: Pharmacist

## 2020-10-08 DIAGNOSIS — E1165 Type 2 diabetes mellitus with hyperglycemia: Secondary | ICD-10-CM

## 2020-10-08 DIAGNOSIS — E782 Mixed hyperlipidemia: Secondary | ICD-10-CM

## 2020-10-08 DIAGNOSIS — I10 Essential (primary) hypertension: Secondary | ICD-10-CM

## 2020-10-08 NOTE — Patient Instructions (Signed)
Visit Information  Goals Addressed              This Visit's Progress     Patient Stated   .  Medication Monitoring (pt-stated)         Patient Goals/Self-Care Activities . Over the next 90 days, patient will:  - check glucose using CGM, document, and provide at future appointments - collaborate with provider on medication access solutions        The patient verbalized understanding of instructions, educational materials, and care plan provided today and declined offer to receive copy of patient instructions, educational materials, and care plan.   Plan: Telephone follow up appointment with care management team member scheduled for:  ~ 8 weeks  [name], PharmD, [place], [place] Clinical Pharmacist [employer] at [employer] [phone]

## 2020-10-08 NOTE — Chronic Care Management (AMB) (Signed)
Care Management   Pharmacy Note  10/08/2020 Name: Jonathan West MRN: [id number] DOB: 11/25/1960  Subjective: Jonathan West is a 60 y.o. year old male who is a primary care patient of Caryl Bis, Angela Adam, MD. The Care Management team was consulted for assistance with care management and care coordination needs.    Engaged with patient by telephone for follow up visit in response to provider referral for pharmacy case management and/or care coordination services.   The patient was given information about Care Management services today including:  1. Care Management services includes personalized support from designated clinical staff supervised by the patient's primary care provider, including individualized plan of care and coordination with other care providers. 2. 24/7 contact phone numbers for assistance for urgent and routine care needs. 3. The patient may stop case management services at any time by phone call to the office staff.  Patient agreed to services and consent obtained.  Assessment:  Review of patient status, including review of consultants reports, laboratory and other test data, was performed as part of comprehensive evaluation and provision of chronic care management services.   SDOH (Social Determinants of Health) assessments and interventions performed:    Objective:  Lab Results  Component Value Date   CREATININE 1.17 06/28/2020   CREATININE 1.28 12/25/2019   CREATININE 1.26 09/18/2019    Lab Results  Component Value Date   HGBA1C 8.7 (H) 06/28/2020       Component Value Date/Time   CHOL 118 06/28/2020 1515   TRIG 126 06/28/2020 1515   HDL 33 (L) 06/28/2020 1515   CHOLHDL 3.6 06/28/2020 1515   VLDL 21.4 06/14/2019 1134   LDLCALC 64 06/28/2020 1515   LDLDIRECT 58.0 09/13/2017 1232      BP Readings from Last 3 Encounters:  06/28/20 118/70  03/27/20 118/70  12/25/19 120/70    Care Plan  Allergies  Allergen Reactions  . Metformin Other (See  Comments)    Indigestion with immediate release    Medications Reviewed Today    Reviewed by De Hollingshead, RPH-CPP (Pharmacist) on 10/08/20 at [place] List Status: <None>  Medication Order Taking? Sig Documenting Provider Last Dose Status Informant  amLODipine (NORVASC) 5 MG tablet [id number] Yes Take 1 tablet (5 mg total) by mouth daily. Leone Haven, MD Taking Active   aspirin EC 81 MG tablet [id number] Yes Take 81 mg by mouth daily. [provider] Taking Active   atorvastatin (LIPITOR) 10 MG tablet [id number] Yes TAKE 1 TABLET BY MOUTH EVERY DAY Leone Haven, MD Taking Active   blood glucose meter kit and supplies [id number] Yes Dispense based on patient and insurance preference. Use up to four times daily as directed. (FOR ICD-9 250.00, 250.01). Leone Haven, MD Taking Active   Continuous Blood Gluc Sensor (FREESTYLE LIBRE 2 SENSOR) [place] [id number] Yes Use to check sugars at least 3 times daily Leone Haven, MD Taking Active   ertugliflozin L-PyroglutamicAc (STEGLATRO) 15 MG TABS tablet [id number] Yes TAKE 1 TABLET BY MOUTH ONCE DAILY BEFORE BREAKFAST Leone Haven, MD Taking Active   gabapentin (NEURONTIN) 300 MG capsule [id number] Yes Take 3 capsules (900 mg total) by mouth at bedtime. Leone Haven, MD Taking Active   glimepiride (AMARYL) 2 MG tablet [id number] Yes Take 1 tablet (2 mg total) by mouth 2 (two) times daily. Leone Haven, MD Taking Active   glucose blood test strip [id number] Yes CHECK BLOOD SUGAR 3 TIMES  DAILY Leone Haven,  MD Taking Active   Insulin Glargine (BASAGLAR KWIKPEN) 100 UNIT/ML [id number]  Inject 46 units daily. Titrate as tolerated. Max daily dose 60 units Leone Haven, MD  Active   Insulin Pen Needle (PEN NEEDLES) 32G X 4 MM MISC [id number] Yes Inject 1 pen into the skin daily. Use to inject insulin daily. Leone Haven, MD Taking Active   lisinopril (ZESTRIL) 20 MG tablet [id number] Yes Take 2  tablets (40 mg total) by mouth daily. Leone Haven, MD Taking Active   metFORMIN (GLUCOPHAGE-XR) 500 MG 24 hr tablet [id number] Yes TAKE 4 TABLETS BY MOUTH ONCE DAILY WITH BREAKFAST Leone Haven, MD Taking Active   [facility] LANCETS 86V [place] [id number] Yes Check blood glucose three times daily DX: E11.65 Leone Haven, MD Taking Active   propranolol ER (INDERAL LA) 60 MG 24 hr capsule [id number] Yes Take 1 capsule (60 mg total) by mouth daily. Leone Haven, MD Taking Active   Vitamin D, Ergocalciferol, 2000 units CAPS [id number] Yes Take by mouth daily. [provider] Taking Active           Patient Active Problem List   Diagnosis Date Noted  . Fall 06/28/2020  . Radiculopathy 03/27/2020  . Scalp abrasion 03/27/2020  . Muscle cramps 12/25/2019  . Neuropathy 09/18/2019  . Multiple nevi 02/09/2018  . Hyperlipidemia 11/11/2017  . History of anemia 08/11/2017  . Lipoma 05/22/2015  . Essential hypertension 06/30/2014  . Erectile dysfunction associated with type 2 diabetes mellitus ([place]) 03/27/2014  . DM (diabetes mellitus), type 2, uncontrolled ([place]) 11/09/2013  . B12 deficiency 11/09/2013  . Vitamin D deficiency 11/09/2013    Conditions to be addressed/monitored: HTN, HLD and DMII  Care Plan : General Plan of Care (Adult)  Updates made by De Hollingshead, RPH-CPP since 10/08/2020 12:00 AM    Problem: Disease Management     Long-Range Goal: Disease Progression Prevention   This Visit's Progress: On track  Recent Progress: On track  Priority: High  Note:   Current Barriers:  . Unable to independently afford treatment regimen . Unable to achieve control of diabetes   Pharmacist Clinical Goal(s):  Marland Kitchen Over the next 90 days, patient will work with PharmD and provider towards optimized glycemic control  Interventions: . 1:1 collaboration with Leone Haven, MD regarding development and update of comprehensive plan of care as evidenced by  provider attestation and co-signature . Inter-disciplinary care team collaboration (see longitudinal plan of care) . Comprehensive medication review performed; medication list updated in electronic medical record   Health Maintenance: . Reports that his daughter in [place] mother passed away from [place] last week after a lengthy hospitalization. Much of the family contracted COVID. He notes stress lately providing support.   Diabetes: . Uncontrolled; current treatment: metformin 1000 mg BID, Steglatro 15 mg daily, glimepiride 2 mg BID, Basaglar 46 units daily - he notes that he continues to often forget evening dose of glimepiride with supper. He has a reminder set, but that is not always alerting correctly.  . Peripheral neuropathy: gabapentin 900 mg QPM . Current glucose readings: fasting glucose:  Date of Download: 09/25/20-10/08/20 % Time CGM is active: 42% Average Glucose: 221 mg/dL Time in Goal:  - Time in range 70-180: 64% - Time above range: 36% - Time below range: 0% Observed patterns: too little data to interpret.  . Reviewed CGM. Reviewed need to scan at least [facility] for full glucose download.  Marland Kitchen He plans to change his alert  to an alarm every night before supper. Discussed other methods of reminders including setting bottle on the dinner table to see as a reminder.  . If next A1c not at goal, recommend increasing basal insulin. Intensive dietary adjustment would likely also be beneficial, however, patient's work schedule limits his focus on appropriate dietary choices. Continue to advise and educate. . Continue metformin 1000 mg BID, Steglatro 15 mg daily, glimepiride 2 mg BID, Basaglar 46 units daily.   Hypertension: . Controlled per last clinic reading; current treatment: amlodipine 5 mg QAM, lisinopril 40 mg QAM, propranolol 60 mg daily;  . Recommended continuation of current regimen  Hyperlipidemia: . Controlled per last lipid panel; current treatment: atorvastatin 10 mg daily;   . Concurrent aspirin 81 mg daily for ASCVD risk reduction . Recommended continuation of current regimen   Patient Goals/Self-Care Activities . Over the next 90 days, patient will:  - check glucose using CGM, document, and provide at future appointments - collaborate with provider on medication access solutions  Follow Up Plan: Telephone follow up appointment with care management team member scheduled for: ~8 weeks (PCP visit in 4 weeks)      Medication Assistance:  None required.  Patient affirms current coverage meets needs.  Follow Up:  Patient agrees to Care Plan and Follow-up.  Plan: Telephone follow up appointment with care management team member scheduled for:  ~ 8 weeks  [name], PharmD, [place], [place] Clinical Pharmacist [employer] at [employer] [phone]

## 2020-10-20 ENCOUNTER — Other Ambulatory Visit: Payer: Self-pay | Admitting: Family Medicine

## 2020-10-30 ENCOUNTER — Other Ambulatory Visit: Payer: Self-pay

## 2020-11-01 ENCOUNTER — Encounter: Payer: Self-pay | Admitting: Family Medicine

## 2020-11-01 ENCOUNTER — Ambulatory Visit (INDEPENDENT_AMBULATORY_CARE_PROVIDER_SITE_OTHER): Payer: BC Managed Care – PPO | Admitting: Family Medicine

## 2020-11-01 ENCOUNTER — Other Ambulatory Visit: Payer: Self-pay

## 2020-11-01 VITALS — BP 118/78 | HR 79 | Temp 97.6°F | Ht 70.0 in | Wt 179.4 lb

## 2020-11-01 DIAGNOSIS — R253 Fasciculation: Secondary | ICD-10-CM | POA: Diagnosis not present

## 2020-11-01 DIAGNOSIS — G629 Polyneuropathy, unspecified: Secondary | ICD-10-CM

## 2020-11-01 DIAGNOSIS — I10 Essential (primary) hypertension: Secondary | ICD-10-CM | POA: Diagnosis not present

## 2020-11-01 DIAGNOSIS — E1165 Type 2 diabetes mellitus with hyperglycemia: Secondary | ICD-10-CM | POA: Diagnosis not present

## 2020-11-01 LAB — POCT GLYCOSYLATED HEMOGLOBIN (HGB A1C): Hemoglobin A1C: 8 % — AB (ref 4.0–5.6)

## 2020-11-01 MED ORDER — ATORVASTATIN CALCIUM 10 MG PO TABS: 10.0000 mg | ORAL_TABLET | Freq: Every day | ORAL | 1 refills | Status: DC

## 2020-11-01 MED ORDER — PROPRANOLOL HCL ER 60 MG PO CP24: 60.0000 mg | ORAL_CAPSULE | Freq: Every day | ORAL | 3 refills | Status: DC

## 2020-11-01 MED ORDER — GABAPENTIN 300 MG PO CAPS: ORAL_CAPSULE | ORAL | 2 refills | Status: DC

## 2020-11-01 NOTE — Assessment & Plan Note (Signed)
Still uncontrolled though is improved.  I advised that he should start using an alarm to remind himself to take his evening dose of glimepiride before eating dinner.  He will continue his Steglatro, Engineer, agricultural, and Metformin as well as his glyburide.

## 2020-11-01 NOTE — Progress Notes (Signed)
Eric Sonnenberg, MD Phone: [phone]  Jonathan West is a 60 y.o. male who presents today for f/u.  DIABETES Disease Monitoring: Blood Sugar ranges-115-275 Polyuria/phagia/dipsia- no      Optho- due Medications: Compliance- taking steglatro, basaglar, metformin, glimeperide, he notes he is typically not taking the evening dose of the glimepiride given difficulty remembering this. Hypoglycemic symptoms- no  HYPERTENSION  Disease Monitoring  Home BP Monitoring not checking Chest pain- no    Dyspnea- no Medications  Compliance-  Taking amlodipine, propranolol.  Edema- no  Neuropathy: Patient feels as though this is gotten somewhat worse with tingling and more sensitivity in his feet particularly when the sheets touch midnight.  He takes 900 mg of gabapentin nightly.  Does not take any during the day.  No drowsiness.  Facial twitching: This is only over the last couple of weeks.  He notes no numbness or weakness.  No slurring of his speech.  No pain.  It just occasionally occurs.    Social History   Tobacco Use  Smoking Status Never Smoker  Smokeless Tobacco Never Used    Current Outpatient Medications on File Prior to Visit  Medication Sig Dispense Refill  . amLODipine (NORVASC) 5 MG tablet Take 1 tablet (5 mg total) by mouth daily. 90 tablet 1  . aspirin EC 81 MG tablet Take 81 mg by mouth daily.    . blood glucose meter kit and supplies Dispense based on patient and insurance preference. Use up to four times daily as directed. (FOR ICD-9 250.00, 250.01). 1 each 0  . Continuous Blood Gluc Sensor (FREESTYLE LIBRE 2 SENSOR) MISC Use to check sugars at least 3 times daily 2 each 11  . ertugliflozin L-PyroglutamicAc (STEGLATRO) 15 MG TABS tablet TAKE 1 TABLET BY MOUTH ONCE DAILY BEFORE BREAKFAST 30 tablet 11  . glimepiride (AMARYL) 2 MG tablet Take 1 tablet (2 mg total) by mouth 2 (two) times daily. 180 tablet 1  . glucose blood test strip CHECK BLOOD SUGAR 3 TIMES  DAILY 300  each 3  . Insulin Glargine (BASAGLAR KWIKPEN) 100 UNIT/ML Inject 46 units daily. Titrate as tolerated. Max daily dose 60 units 54 mL 3  . Insulin Pen Needle (PEN NEEDLES) 32G X 4 MM MISC Inject 1 pen into the skin daily. Use to inject insulin daily. 100 each 3  . lisinopril (ZESTRIL) 20 MG tablet Take 2 tablets by mouth once daily 180 tablet 0  . metFORMIN (GLUCOPHAGE-XR) 500 MG 24 hr tablet TAKE 4 TABLETS BY MOUTH ONCE DAILY WITH BREAKFAST 360 tablet 0  . ONETOUCH DELICA LANCETS 33G MISC Check blood glucose three times daily DX: E11.65 300 each 1  . Vitamin D, Ergocalciferol, 2000 units CAPS Take by mouth daily.     No current facility-administered medications on file prior to visit.     ROS see history of present illness  Objective  Physical Exam Vitals:   11/01/20 1542  BP: 118/78  Pulse: 79  Temp: 97.6 F (36.4 C)  SpO2: 98%    BP Readings from Last 3 Encounters:  11/01/20 118/78  06/28/20 118/70  03/27/20 118/70   Wt Readings from Last 3 Encounters:  11/01/20 179 lb 6.4 oz (81.4 kg)  06/28/20 180 lb 6.4 oz (81.8 kg)  03/27/20 184 lb 6.4 oz (83.6 kg)    Physical Exam Constitutional:      General: He is not in acute distress.    Appearance: He is not diaphoretic.  Cardiovascular:     Rate and   Rhythm: Normal rate and regular rhythm.     Heart sounds: Normal heart sounds.  Pulmonary:     Effort: Pulmonary effort is normal.     Breath sounds: Normal breath sounds.  Musculoskeletal:        General: No edema.  Skin:    General: Skin is warm and dry.  Neurological:     Mental Status: He is alert.     Comments: CN 3-12 intact, 5/5 strength in bilateral biceps, triceps, grip, quads, hamstrings, plantar and dorsiflexion, sensation to light touch intact in bilateral UE and LE, normal gait, 2+ patellar reflexes      Assessment/Plan: Please see individual problem list.  Problem List Items Addressed This Visit    DM (diabetes mellitus), type 2, uncontrolled ([place])     Still uncontrolled though is improved.  I advised that he should start using an alarm to remind himself to take his evening dose of glimepiride before eating dinner.  He will continue his Steglatro, Engineer, agricultural, and Metformin as well as his glyburide.      Relevant Medications   atorvastatin (LIPITOR) 10 MG tablet   Other Relevant Orders   POCT HgB A1C (Completed)   Essential hypertension - Primary    Well-controlled.  He will continue propranolol 60 mg daily and lisinopril 20 mg daily.      Relevant Medications   propranolol ER (INDERAL LA) 60 MG 24 hr capsule   atorvastatin (LIPITOR) 10 MG tablet   Muscle twitch    Benign exam today.  Discussed possible stress or anxiousness could have been playing a role.  He will monitor.  If it persists he will let us know.      Neuropathy    Slight worsening of symptoms.  He will add a 300 mg capsule of gabapentin in the morning and he will continue his gabapentin 900 mg at night.  He will monitor for drowsiness with this.  If not helpful over the next week or 2 he will let us know through [place].          Health Maintenance: The patient was advised to see his eye doctor for his yearly eye exam.  Discussed the importance of this.     This visit occurred during the SARS-CoV-2 public health emergency.  Safety protocols were in place, including screening questions prior to the visit, additional usage of staff PPE, and extensive cleaning of exam room while observing appropriate contact time as indicated for disinfecting solutions.    [name], MD [place]

## 2020-11-01 NOTE — Assessment & Plan Note (Signed)
Well-controlled.  He will continue propranolol 60 mg daily and lisinopril 20 mg daily.

## 2020-11-01 NOTE — Patient Instructions (Addendum)
Nice to see you. Please set an alarm to take your glimepiride before eating. Please take a 300 mg dose of gabapentin in the morning.  If this makes you drowsy please let us know.

## 2020-11-01 NOTE — Assessment & Plan Note (Signed)
Benign exam today.  Discussed possible stress or anxiousness could have been playing a role.  He will monitor.  If it persists he will let us know.

## 2020-11-01 NOTE — Assessment & Plan Note (Signed)
Slight worsening of symptoms.  He will add a 300 mg capsule of gabapentin in the morning and he will continue his gabapentin 900 mg at night.  He will monitor for drowsiness with this.  If not helpful over the next week or 2 he will let us know through [place].

## 2020-11-02 ENCOUNTER — Encounter: Payer: Self-pay | Admitting: Family Medicine

## 2020-11-02 DIAGNOSIS — E1165 Type 2 diabetes mellitus with hyperglycemia: Secondary | ICD-10-CM

## 2020-11-29 ENCOUNTER — Other Ambulatory Visit: Payer: Self-pay | Admitting: Family Medicine

## 2020-11-29 DIAGNOSIS — I10 Essential (primary) hypertension: Secondary | ICD-10-CM

## 2020-12-03 ENCOUNTER — Telehealth: Payer: BC Managed Care – PPO

## 2020-12-03 ENCOUNTER — Telehealth: Payer: Self-pay | Admitting: Pharmacist

## 2020-12-03 NOTE — Telephone Encounter (Signed)
  Chronic Care Management   Note  12/03/2020 Name: Jonathan West MRN: [id number] DOB: 20-Aug-1961   Attempted to contact patient for scheduled appointment for medication management support. Left HIPAA compliant message for patient to return my call at their convenience.    Plan: - If I do not hear back from the patient by end of business today, will collaborate with Care Guide to outreach to schedule follow up with me  [name], PharmD, [place], [place] Pharmacist [employer] at [employer] [phone]

## 2020-12-04 ENCOUNTER — Telehealth: Payer: Self-pay

## 2020-12-04 NOTE — Telephone Encounter (Signed)
Jonathan West  Note states that this patient needs to be rescheduled ASAP. I tried to call today and could not get him. Could you please try again in the next couple of days   Thank you  [employer]

## 2020-12-04 NOTE — Chronic Care Management (AMB) (Signed)
  Care Management   Note  12/04/2020 Name: Jonathan West MRN: [id number] DOB: 1960/11/09  [name] is a 60 y.o. year old male who is a primary care patient of Leone Haven, MD and is actively engaged with the care management team. I reached out to [name] by phone today to assist with re-scheduling a follow up visit with the Pharmacist  Follow up plan: Unsuccessful telephone outreach attempt made. A HIPAA compliant phone message was left for the patient providing contact information and requesting a return call.  The care management team will reach out to the patient again over the next 3 days.  If patient returns call to provider office, please advise to call Ada  at [place], [place], [place], [place] [address] Direct Dial: [phone] [email or web address]

## 2020-12-06 NOTE — Chronic Care Management (AMB) (Signed)
  Care Management   Note  12/06/2020 Name: Jonathan West MRN: [id number] DOB: 1961/02/25  [name] is a 60 y.o. year old male who is a primary care patient of Caryl Bis, Angela Adam, MD and is actively engaged with the care management team. I reached out to [name] by phone today to assist with re-scheduling a follow up visit with the Pharmacist  Follow up plan: Telephone appointment with care management team member scheduled for:12/12/2020  [place] Management

## 2020-12-06 NOTE — Telephone Encounter (Signed)
Catie  Rescheduled patient for 12/12/2020  [place] Management

## 2020-12-12 ENCOUNTER — Ambulatory Visit: Payer: BC Managed Care – PPO | Admitting: Pharmacist

## 2020-12-12 DIAGNOSIS — E1165 Type 2 diabetes mellitus with hyperglycemia: Secondary | ICD-10-CM

## 2020-12-12 MED ORDER — BASAGLAR KWIKPEN 100 UNIT/ML ~~LOC~~ SOPN: PEN_INJECTOR | SUBCUTANEOUS | 3 refills | Status: DC

## 2020-12-12 NOTE — Patient Instructions (Signed)
Visit Information  Goals Addressed              This Visit's Progress     Patient Stated   .  Medication Monitoring (pt-stated)        Patient Goals/Self-Care Activities . Over the next 90 days, patient will:  - check glucose using CGM, document, and provide at future appointments - collaborate with provider on medication access solutions         Patient verbalizes understanding of instructions provided today and agrees to view in [place].   Plan: Telephone follow up appointment with care management team member scheduled for:  ~ 4 weeks  [name], PharmD, [place], [place] Clinical Pharmacist [employer] at [employer] [phone]

## 2020-12-12 NOTE — Chronic Care Management (AMB) (Signed)
Care Management   Pharmacy Note  12/12/2020 Name: Jonathan West MRN: [id number] DOB: September 24, 1960  Subjective: Jonathan West is a 60 y.o. year old male who is a primary care patient of Caryl Bis, Angela Adam, MD. The Care Management team was consulted for assistance with care management and care coordination needs.    Engaged with patient by telephone for follow up visit in response to provider referral for pharmacy case management and/or care coordination services.   The patient was given information about Care Management services today including:  1. Care Management services includes personalized support from designated clinical staff supervised by the patient's primary care provider, including individualized plan of care and coordination with other care providers. 2. 24/7 contact phone numbers for assistance for urgent and routine care needs. 3. The patient may stop case management services at any time by phone call to the office staff.  Patient agreed to services and consent obtained.  Assessment:  Review of patient status, including review of consultants reports, laboratory and other test data, was performed as part of comprehensive evaluation and provision of chronic care management services.   SDOH (Social Determinants of Health) assessments and interventions performed:    Objective:  Lab Results  Component Value Date   CREATININE 1.17 06/28/2020   CREATININE 1.28 12/25/2019   CREATININE 1.26 09/18/2019    Lab Results  Component Value Date   HGBA1C 8.0 (A) 11/01/2020       Component Value Date/Time   CHOL 118 06/28/2020 1515   TRIG 126 06/28/2020 1515   HDL 33 (L) 06/28/2020 1515   CHOLHDL 3.6 06/28/2020 1515   VLDL 21.4 06/14/2019 1134   LDLCALC 64 06/28/2020 1515   LDLDIRECT 58.0 09/13/2017 1232    Clinical ASCVD: No    BP Readings from Last 3 Encounters:  11/01/20 118/78  06/28/20 118/70  03/27/20 118/70    Care Plan  Allergies  Allergen Reactions  .  Metformin Other (See Comments)    Indigestion with immediate release    Medications Reviewed Today    Reviewed by De Hollingshead, RPH-CPP (Pharmacist) on 12/12/20 at [place] List Status: <None>  Medication Order Taking? Sig Documenting Provider Last Dose Status Informant  amLODipine (NORVASC) 5 MG tablet [id number] Yes Take 1 tablet (5 mg total) by mouth daily. Leone Haven, MD Taking Active   aspirin EC 81 MG tablet [id number] Yes Take 81 mg by mouth daily. [provider] Taking Active   atorvastatin (LIPITOR) 10 MG tablet [id number] Yes Take 1 tablet (10 mg total) by mouth daily. Leone Haven, MD Taking Active   gabapentin (NEURONTIN) 300 MG capsule [id number] Yes Take 1 capsule (300 mg total) by mouth in the morning AND 3 capsules (900 mg total) at bedtime. Leone Haven, MD Taking Active   glimepiride (AMARYL) 2 MG tablet [id number] Yes Take 1 tablet (2 mg total) by mouth 2 (two) times daily. Leone Haven, MD Taking Active   Insulin Glargine [facility]) 100 UNIT/ML [id number] Yes Inject 46 units daily. Titrate as tolerated. Max daily dose 60 units Leone Haven, MD Taking Active            Med Note Darnelle Maffucci, Maralyn Sago Dec 12, 2020  3:58 PM) 44 units  lisinopril (ZESTRIL) 20 MG tablet [id number] Yes Take 2 tablets by mouth once daily Leone Haven, MD Taking Active   metFORMIN (GLUCOPHAGE-XR) 500 MG 24 hr tablet [id number] Yes TAKE 4 TABLETS BY MOUTH  ONCE DAILY WITH BREAKFAST Leone Haven, MD Taking Active   propranolol ER (INDERAL LA) 60 MG 24 hr capsule [id number] Yes TAKE 1 CAPSULE BY MOUTH EVERY DAY Leone Haven, MD Taking Active   Vitamin D, Ergocalciferol, 2000 units CAPS [id number] Yes Take by mouth daily. [provider] Taking Active           Patient Active Problem List   Diagnosis Date Noted  . Muscle twitch 11/01/2020  . Fall 06/28/2020  . Radiculopathy 03/27/2020  . Scalp abrasion 03/27/2020  .  Muscle cramps 12/25/2019  . Neuropathy 09/18/2019  . Multiple nevi 02/09/2018  . Hyperlipidemia 11/11/2017  . History of anemia 08/11/2017  . Lipoma 05/22/2015  . Essential hypertension 06/30/2014  . Erectile dysfunction associated with type 2 diabetes mellitus ([place]) 03/27/2014  . DM (diabetes mellitus), type 2, uncontrolled ([place]) 11/09/2013  . B12 deficiency 11/09/2013  . Vitamin D deficiency 11/09/2013    Conditions to be addressed/monitored: HTN, HLD and DMII  Care Plan : General Plan of Care (Adult)  Updates made by De Hollingshead, RPH-CPP since 12/12/2020 12:00 AM    Problem: Disease Management     Long-Range Goal: Disease Progression Prevention   This Visit's Progress: On track  Recent Progress: On track  Priority: High  Note:   Current Barriers:  . Unable to independently afford treatment regimen . Unable to achieve control of diabetes   Pharmacist Clinical Goal(s):  Marland Kitchen Over the next 90 days, patient will work with PharmD and provider towards optimized glycemic control  Interventions: . 1:1 collaboration with Leone Haven, MD regarding development and update of comprehensive plan of care as evidenced by provider attestation and co-signature . Inter-disciplinary care team collaboration (see longitudinal plan of care) . Comprehensive medication review performed; medication list updated in electronic medical record   SDOH: . Reports that work has been busy lately.    Diabetes: . Uncontrolled; current treatment: metformin 1000 mg BID, Steglatro 15 mg daily - copay card for [employer] expired and company has changed their requirements that he cannot enroll for a new coupon. No other affordable options for SGLT2 or GLP1 given high deductible insurance plan; glimepiride 2 mg BID, Basaglar 44 units daily . Peripheral neuropathy: gabapentin 900 mg QPM . Current glucose readings: fasting glucose:  Date of Download: 3/24-12/11/20 % Time CGM is active: 37% Average  Glucose: 301 mg/dL Time in Goal:  - Time in range 70-180: 2% - Time above range: 98% - Time below range: 0% Observed patterns: too little data to interpret.  . Reviewed CGM. Reviewed need to scan at least [facility] for full glucose download.  . Significant elevations since patient had to discontinue SGLT2. No other options for affordability of SGLT2 or GLP1 at this time given patient's insurance coverage.  . Increase Basaglar to 52 units daily (~15-20% increase). Continue metformin 1000 mg BID and glimepiride 2 mg BID at this time. Moving forward, consider d/c glimepiride and initiate mealtime insulin if we are unable to make significant headway with glucose control with basal titration. Discussed that we would likely split the glargine dose to 30 units BID if we approach at total of 60 units daily.  . Continue to remind patient of importance of focus on appropriate lifestyle choices to reduce risk of long term complications of uncontrolled diabetes  Hypertension: . Controlled per last clinic reading; current treatment: amlodipine 5 mg QAM, lisinopril 40 mg QAM, propranolol 60 mg daily;  . Previously recommended continuation of current regimen  Hyperlipidemia: . Controlled per last lipid panel; current treatment: atorvastatin 10 mg daily;  . Concurrent aspirin 81 mg daily for ASCVD risk reduction . Previously recommended continuation of current regimen   Patient Goals/Self-Care Activities . Over the next 90 days, patient will:  - check glucose using CGM, document, and provide at future appointments - collaborate with provider on medication access solutions  Follow Up Plan: Telephone follow up appointment with care management team member scheduled for: ~4 weeks      Medication Assistance:  Focusing on medication access strategies  Follow Up:  Patient agrees to Care Plan and Follow-up.  Plan: Telephone follow up appointment with care management team member scheduled for:  ~ 4  weeks  [name], PharmD, [place], [place] Clinical Pharmacist [employer] at [employer] [phone]

## 2020-12-15 ENCOUNTER — Other Ambulatory Visit: Payer: Self-pay | Admitting: Family Medicine

## 2020-12-15 DIAGNOSIS — E1165 Type 2 diabetes mellitus with hyperglycemia: Secondary | ICD-10-CM

## 2020-12-15 DIAGNOSIS — I10 Essential (primary) hypertension: Secondary | ICD-10-CM

## 2021-01-08 ENCOUNTER — Ambulatory Visit: Payer: BC Managed Care – PPO | Admitting: Pharmacist

## 2021-01-08 DIAGNOSIS — E782 Mixed hyperlipidemia: Secondary | ICD-10-CM

## 2021-01-08 DIAGNOSIS — I10 Essential (primary) hypertension: Secondary | ICD-10-CM

## 2021-01-08 DIAGNOSIS — E1165 Type 2 diabetes mellitus with hyperglycemia: Secondary | ICD-10-CM

## 2021-01-08 NOTE — Patient Instructions (Signed)
Visit Information  Goals Addressed              This Visit's Progress     Patient Stated   .  Medication Monitoring (pt-stated)        Patient Goals/Self-Care Activities . Over the next 90 days, patient will:  - check glucose using CGM, document, and provide at future appointments - collaborate with provider on medication access solutions       Patient verbalizes understanding of instructions provided today and agrees to view in [place].  Plan: Face to Face appointment with care management team member scheduled for: ~ 12 weeks  [name], PharmD, [place], [place] Clinical Pharmacist [employer] at [employer] [phone]

## 2021-01-08 NOTE — Chronic Care Management (AMB) (Signed)
  Chronic Care Management Pharmacy Note  01/08/2021 Name:  Jonathan West MRN:  [id number] DOB:  08/16/1961  Subjective: Jonathan West is an 59 y.o. year old male who is a primary patient of Sonnenberg, Eric G, MD.  The CCM team was consulted for assistance with disease management and care coordination needs.    Engaged with patient by telephone for follow up visit in response to provider referral for pharmacy case management and/or care coordination services.   Consent to Services:  The patient was given information about Chronic Care Management services, agreed to services, and gave verbal consent prior to initiation of services.  Please see initial visit note for detailed documentation.   Patient Care Team: Sonnenberg, Eric G, MD as PCP - General (Family Medicine) Travis, Catherine E, RPH-CPP as Pharmacist (Pharmacist)  Recent office visits: None since our last call  Recent consult visits: None since our last call  Hospital visits: None in previous 6 months  Objective:  Lab Results  Component Value Date   CREATININE 1.17 06/28/2020   CREATININE 1.28 12/25/2019   CREATININE 1.26 09/18/2019    Lab Results  Component Value Date   HGBA1C 8.0 (A) 11/01/2020   Last diabetic Eye exam:  Lab Results  Component Value Date/Time   HMDIABEYEEXA Retinopathy (A) 08/18/2018 12:00 AM    Last diabetic Foot exam:  Lab Results  Component Value Date/Time   HMDIABFOOTEX normal 07/25/2014 12:00 AM        Component Value Date/Time   CHOL 118 06/28/2020 1515   TRIG 126 06/28/2020 1515   HDL 33 (L) 06/28/2020 1515   CHOLHDL 3.6 06/28/2020 1515   VLDL 21.4 06/14/2019 1134   LDLCALC 64 06/28/2020 1515   LDLDIRECT 58.0 09/13/2017 1232    Hepatic Function Latest Ref Rng & Units 06/28/2020 06/14/2019 02/09/2018  Total Protein 6.1 - 8.1 g/dL 7.1 7.2 7.0  Albumin 3.5 - 5.2 g/dL - 4.3 4.3  AST 10 - 35 U/L 14 21 16  ALT 9 - 46 U/L 16 23 18  Alk Phosphatase 39 - 117 U/L - 91 89   Total Bilirubin 0.2 - 1.2 mg/dL 0.6 0.8 0.7  Bilirubin, Direct 0.0 - 0.3 mg/dL - - -    Lab Results  Component Value Date/Time   TSH 0.82 11/06/2013 04:04 PM    CBC Latest Ref Rng & Units 08/11/2017 05/07/2015 11/06/2013  WBC 4.0 - 10.5 K/uL 7.7 6.5 5.3  Hemoglobin 13.0 - 17.0 g/dL 14.6 15.8 15.8  Hematocrit 39.0 - 52.0 % 43.9 47.3 46.3  Platelets 150.0 - 400.0 K/uL 245.0 218.0 191.0    Lab Results  Component Value Date/Time   VD25OH 49.12 06/14/2019 11:34 AM   VD25OH 35.17 08/11/2017 02:29 PM   Clinical ASCVD: No    Social History   Tobacco Use  Smoking Status Never Smoker  Smokeless Tobacco Never Used   BP Readings from Last 3 Encounters:  11/01/20 118/78  06/28/20 118/70  03/27/20 118/70   Pulse Readings from Last 3 Encounters:  11/01/20 79  06/28/20 70  03/27/20 78   Wt Readings from Last 3 Encounters:  11/01/20 179 lb 6.4 oz (81.4 kg)  06/28/20 180 lb 6.4 oz (81.8 kg)  03/27/20 184 lb 6.4 oz (83.6 kg)     Assessment: Review of patient past medical history, allergies, medications, health status, including review of consultants reports, laboratory and other test data, was performed as part of comprehensive evaluation and provision of chronic care management services.     SDOH:  (Social Determinants of Health) assessments and interventions performed:  SDOH Interventions   Flowsheet Row Most Recent Value  SDOH Interventions   Financial Strain Interventions Other (Comment)  [evaluating options on high deductible insurance plan]      CCM Care Plan  Allergies  Allergen Reactions  . Metformin Other (See Comments)    Indigestion with immediate release    Medications Reviewed Today    Reviewed by Travis, Catherine E, RPH-CPP (Pharmacist) on 12/12/20 at 1601  Med List Status: <None>  Medication Order Taking? Sig Documenting Provider Last Dose Status Informant  amLODipine (NORVASC) 5 MG tablet [id number] Yes Take 1 tablet (5 mg total) by mouth daily.  Sonnenberg, Eric G, MD Taking Active   aspirin EC 81 MG tablet [id number] Yes Take 81 mg by mouth daily. [provider] Taking Active   atorvastatin (LIPITOR) 10 MG tablet [id number] Yes Take 1 tablet (10 mg total) by mouth daily. Sonnenberg, Eric G, MD Taking Active   gabapentin (NEURONTIN) 300 MG capsule [id number] Yes Take 1 capsule (300 mg total) by mouth in the morning AND 3 capsules (900 mg total) at bedtime. Sonnenberg, Eric G, MD Taking Active   glimepiride (AMARYL) 2 MG tablet [id number] Yes Take 1 tablet (2 mg total) by mouth 2 (two) times daily. Sonnenberg, Eric G, MD Taking Active   Insulin Glargine (BASAGLAR KWIKPEN) 100 UNIT/ML [id number] Yes Inject 46 units daily. Titrate as tolerated. Max daily dose 60 units Sonnenberg, Eric G, MD Taking Active            Med Note (TRAVIS, CATHERINE E   Thu Dec 12, 2020  3:58 PM) 44 units  lisinopril (ZESTRIL) 20 MG tablet [id number] Yes Take 2 tablets by mouth once daily Sonnenberg, Eric G, MD Taking Active   metFORMIN (GLUCOPHAGE-XR) 500 MG 24 hr tablet [id number] Yes TAKE 4 TABLETS BY MOUTH ONCE DAILY WITH BREAKFAST Sonnenberg, Eric G, MD Taking Active   propranolol ER (INDERAL LA) 60 MG 24 hr capsule [id number] Yes TAKE 1 CAPSULE BY MOUTH EVERY DAY Sonnenberg, Eric G, MD Taking Active   Vitamin D, Ergocalciferol, 2000 units CAPS [id number] Yes Take by mouth daily. [provider] Taking Active           Patient Active Problem List   Diagnosis Date Noted  . Muscle twitch 11/01/2020  . Fall 06/28/2020  . Radiculopathy 03/27/2020  . Scalp abrasion 03/27/2020  . Muscle cramps 12/25/2019  . Neuropathy 09/18/2019  . Multiple nevi 02/09/2018  . Hyperlipidemia 11/11/2017  . History of anemia 08/11/2017  . Lipoma 05/22/2015  . Essential hypertension 06/30/2014  . Erectile dysfunction associated with type 2 diabetes mellitus (H[facility]) 03/27/2014  . DM (diabetes mellitus), type 2, uncontrolled ([facility]) 11/09/2013  . B12 deficiency  11/09/2013  . Vitamin D deficiency 11/09/2013    Immunization History  Administered Date(s) Administered  . Influenza,inj,Quad PF,6+ Mos 06/27/2014, 05/07/2015, 05/30/2018, 06/28/2020  . Influenza,inj,quad, With Preservative 06/08/2016  . Influenza-Unspecified 08/14/2017  . Pneumococcal Polysaccharide-23 06/27/2014  . Tdap 05/30/2018    Conditions to be addressed/monitored: HTN, HLD and DMII  Care Plan : General Plan of Care (Adult)  Updates made by Travis, Catherine E, RPH-CPP since 01/08/2021 12:00 AM    Problem: Disease Management     Long-Range Goal: Disease Progression Prevention   This Visit's Progress: On track  Recent Progress: On track  Priority: High  Note:   Current Barriers:  . Unable to independently afford treatment regimen . Unable to achieve control of diabetes     Pharmacist Clinical Goal(s):  . Over the next 90 days, patient will work with PharmD and provider towards optimized glycemic control  Interventions: . 1:1 collaboration with Sonnenberg, Eric G, MD regarding development and update of comprehensive plan of care as evidenced by provider attestation and co-signature . Inter-disciplinary care team collaboration (see longitudinal plan of care) . Comprehensive medication review performed; medication list updated in electronic medical record  Diabetes: . Uncontrolled; current treatment: metformin 1000 mg BID, glimepiride 2 mg BID- reports that he is remembering to take his evening glipizide at least 3-4 times weekly, Basaglar 52 units daily . Peripheral neuropathy: gabapentin 900 mg QPM . Current glucose readings: using Libre 2 CGM  Date of Download: 4/21-01/08/21 % Time CGM is active: 45% Average Glucose: 244 mg/dL Time in Goal:  - Time in range 70-180: 27% - Time above range: 73% - Time below range: 0% Observed patterns: significant post prandial elevations.   . Reviewed CGM. Reviewed need to scan at least Q8H for full glucose download.  . Significant  elevations since patient had to discontinue SGLT2. No other options for affordability of SGLT2 or GLP1 at this time given patient's insurance coverage.  . With current use of glimepiride, post prandial spikes are still significant. Discussed potential referral to endocrinology for more intensive education, reiteration of importance of focusing on dietary and lifestyle modifications as well as medication adherence. Patient will discuss with Dr. Sonnenberg at upcoming appointment. If patient continues to be unable to afford brand copays for GLP1/SGLT2, recommend initiation of mealtime insulin.   Hypertension: . Controlled per last clinic reading; current treatment: amlodipine 5 mg QAM, lisinopril 40 mg QAM, propranolol 60 mg daily;  . Previously recommended continuation of current regimen  Hyperlipidemia: . Controlled per last lipid panel; current treatment: atorvastatin 10 mg daily;  . Concurrent aspirin 81 mg daily for ASCVD risk reduction . Previously recommended continuation of current regimen   Patient Goals/Self-Care Activities . Over the next 90 days, patient will:  - check glucose using CGM, document, and provide at future appointments - collaborate with provider on medication access solutions  Follow Up Plan: Telephone follow up appointment with care management team member scheduled for: ~10 weeks      Medication Assistance: Brand medications have been unaffordable, even with savings cards, due to high combined pharmacy and prescription deductible  Patient's preferred pharmacy is:  [id number] - [place], [place] - [address] [place] [address] Phone: [phone] Fax: [phone]  [id number] - [place], [place] - [address] [place] [address] Phone: [phone] Fax: [phone]  Follow Up:  Patient agrees to Care Plan and Follow-up.  Plan: Face to Face appointment with care management team member scheduled  for: ~ 12 weeks  Catie Travis, PharmD, BCACP, CPP Clinical Pharmacist [facility] at [place] [phone]     

## 2021-01-11 ENCOUNTER — Other Ambulatory Visit: Payer: Self-pay | Admitting: Family Medicine

## 2021-01-11 DIAGNOSIS — E1165 Type 2 diabetes mellitus with hyperglycemia: Secondary | ICD-10-CM

## 2021-01-19 ENCOUNTER — Encounter: Payer: Self-pay | Admitting: Family Medicine

## 2021-01-19 DIAGNOSIS — U071 COVID-19: Secondary | ICD-10-CM | POA: Diagnosis not present

## 2021-01-19 DIAGNOSIS — Z20822 Contact with and (suspected) exposure to covid-19: Secondary | ICD-10-CM | POA: Diagnosis not present

## 2021-01-20 ENCOUNTER — Other Ambulatory Visit: Payer: Self-pay | Admitting: Family Medicine

## 2021-01-20 ENCOUNTER — Encounter: Payer: Self-pay | Admitting: Family Medicine

## 2021-01-20 NOTE — Telephone Encounter (Signed)
Please check with Juliann Pulse to see what our duration for exclusion from the office is following a positive COVID test.  You can then let the patient know if he will be able to come in or if we should switch to a virtual visit.

## 2021-01-29 ENCOUNTER — Ambulatory Visit (INDEPENDENT_AMBULATORY_CARE_PROVIDER_SITE_OTHER): Payer: BC Managed Care – PPO | Admitting: Family Medicine

## 2021-01-29 ENCOUNTER — Encounter: Payer: Self-pay | Admitting: Family Medicine

## 2021-01-29 ENCOUNTER — Other Ambulatory Visit: Payer: Self-pay

## 2021-01-29 VITALS — BP 120/80 | HR 69 | Temp 98.4°F | Ht 70.0 in | Wt 189.0 lb

## 2021-01-29 DIAGNOSIS — Z8616 Personal history of COVID-19: Secondary | ICD-10-CM | POA: Diagnosis not present

## 2021-01-29 DIAGNOSIS — I10 Essential (primary) hypertension: Secondary | ICD-10-CM

## 2021-01-29 DIAGNOSIS — E1165 Type 2 diabetes mellitus with hyperglycemia: Secondary | ICD-10-CM

## 2021-01-29 NOTE — Patient Instructions (Signed)
Nice to see you. I am referring you to endocrinology.  If they do not call you within 2 weeks please let us know.

## 2021-01-29 NOTE — Assessment & Plan Note (Signed)
The patient has recovered quite well.  Discussed that his cough should continue to improve.  He reports he has been vaccinated x2 for COVID.  I encouraged him to get the booster sometime around 90 days after his COVID infection started.

## 2021-01-29 NOTE — Progress Notes (Signed)
[name], MD Phone: [phone]  ERHARD SENSKE is a 60 y.o. male who presents today for follow-up.  Diabetes: Sugars have been elevated though notes they are downtrending on his freestyle libre.  He is on glimepiride and only remembers to take it once daily.  Also on metformin and Basaglar 52 units daily.  No polyuria or polydipsia.  No hypoglycemia.  He is trying to eat and more.  He has not seen ophthalmology.  Hypertension: Typically less than 120/80.  Taking amlodipine, lisinopril, and propranolol.  No chest pain, shortness of breath, or edema.  History of COVID-19: Patient notes having COVID a second time.  He is 14 days out from symptom onset.  He had mild sore throat and rhinorrhea as well as mild cough.  He continues to have a mild cough and has recovered well.  Social History   Tobacco Use  Smoking Status Never Smoker  Smokeless Tobacco Never Used    Current Outpatient Medications on File Prior to Visit  Medication Sig Dispense Refill  . amLODipine (NORVASC) 5 MG tablet Take 1 tablet by mouth once daily 90 tablet 0  . aspirin EC 81 MG tablet Take 81 mg by mouth daily.    Marland Kitchen atorvastatin (LIPITOR) 10 MG tablet Take 1 tablet (10 mg total) by mouth daily. 90 tablet 1  . gabapentin (NEURONTIN) 300 MG capsule Take 1 capsule (300 mg total) by mouth in the morning AND 3 capsules (900 mg total) at bedtime. 360 capsule 2  . glimepiride (AMARYL) 2 MG tablet Take 1 tablet by mouth twice daily 180 tablet 0  . Insulin Glargine (BASAGLAR KWIKPEN) 100 UNIT/ML Inject 52 units daily. Titrate as tolerated. Max daily dose 60 units 54 mL 3  . lisinopril (ZESTRIL) 20 MG tablet Take 2 tablets by mouth once daily 180 tablet 0  . metFORMIN (GLUCOPHAGE-XR) 500 MG 24 hr tablet TAKE 4 TABLETS BY MOUTH ONCE DAILY WITH BREAKFAST 360 tablet 0  . propranolol ER (INDERAL LA) 60 MG 24 hr capsule TAKE 1 CAPSULE BY MOUTH EVERY DAY 90 capsule 3  . RELION PEN NEEDLES 32G X 4 MM MISC USE 1 PEN ONCE DAILY  USE  WITH  INSULIN  DAILY 100 each 0  . Vitamin D, Ergocalciferol, 2000 units CAPS Take by mouth daily.    . Continuous Blood Gluc Sensor (FREESTYLE LIBRE 2 SENSOR) MISC USE TO CHECK SUGARS AT LEAST 3 TIMES DAILY    . guaiFENesin-codeine 100-10 MG/5ML syrup Take 5 mLs by mouth every 6 (six) hours as needed.    Marland Kitchen levofloxacin (LEVAQUIN) 500 MG tablet Take 500 mg by mouth daily.     No current facility-administered medications on file prior to visit.     ROS see history of present illness  Objective  Physical Exam Vitals:   01/29/21 1623  BP: 120/80  Pulse: 69  Temp: 98.4 F (36.9 C)  SpO2: 98%    BP Readings from Last 3 Encounters:  01/29/21 120/80  11/01/20 118/78  06/28/20 118/70   Wt Readings from Last 3 Encounters:  01/29/21 189 lb (85.7 kg)  11/01/20 179 lb 6.4 oz (81.4 kg)  06/28/20 180 lb 6.4 oz (81.8 kg)    Physical Exam Constitutional:      General: He is not in acute distress.    Appearance: He is not diaphoretic.  Cardiovascular:     Rate and Rhythm: Normal rate and regular rhythm.     Heart sounds: Normal heart sounds.  Pulmonary:  Effort: Pulmonary effort is normal.     Breath sounds: Normal breath sounds.  Skin:    General: Skin is warm and dry.  Neurological:     Mental Status: He is alert.      Assessment/Plan: Please see individual problem list.  Problem List Items Addressed This Visit    DM (diabetes mellitus), type 2, uncontrolled ([place]) - Primary    Remains uncontrolled.  He is not quite due for an A1c.  Discussed referral to endocrinology to get their input given difficult to control diabetes.  I encouraged him to take his glimepiride twice a day.  He will continue metformin and Basaglar at this time.  I encouraged him to see his eye doctor as well.      Relevant Orders   Ambulatory referral to Endocrinology   Essential hypertension    Well-controlled.  He will continue amlodipine 5 mg once daily, lisinopril 40 mg once daily, and  propranolol 60 mg once daily.      History of COVID-19    The patient has recovered quite well.  Discussed that his cough should continue to improve.  He reports he has been vaccinated x2 for COVID.  I encouraged him to get the booster sometime around 90 days after his COVID infection started.         Return in about 3 months (around 05/01/2021) for Diabetes/hypertension.  This visit occurred during the SARS-CoV-2 public health emergency.  Safety protocols were in place, including screening questions prior to the visit, additional usage of staff PPE, and extensive cleaning of exam room while observing appropriate contact time as indicated for disinfecting solutions.    [name], MD [place]

## 2021-01-29 NOTE — Assessment & Plan Note (Signed)
Well-controlled.  He will continue amlodipine 5 mg once daily, lisinopril 40 mg once daily, and propranolol 60 mg once daily.

## 2021-01-29 NOTE — Assessment & Plan Note (Signed)
Remains uncontrolled.  He is not quite due for an A1c.  Discussed referral to endocrinology to get their input given difficult to control diabetes.  I encouraged him to take his glimepiride twice a day.  He will continue metformin and Basaglar at this time.  I encouraged him to see his eye doctor as well.

## 2021-02-17 IMAGING — DX DG CERVICAL SPINE COMPLETE 4+V
6 series · 6 of 6 positions shown · non-contrast
Comparison: None.

CLINICAL DATA: Bilateral upper extremity radiculopathy.

EXAM:
CERVICAL SPINE - COMPLETE 4+ VIEW

[cervical spine ap]
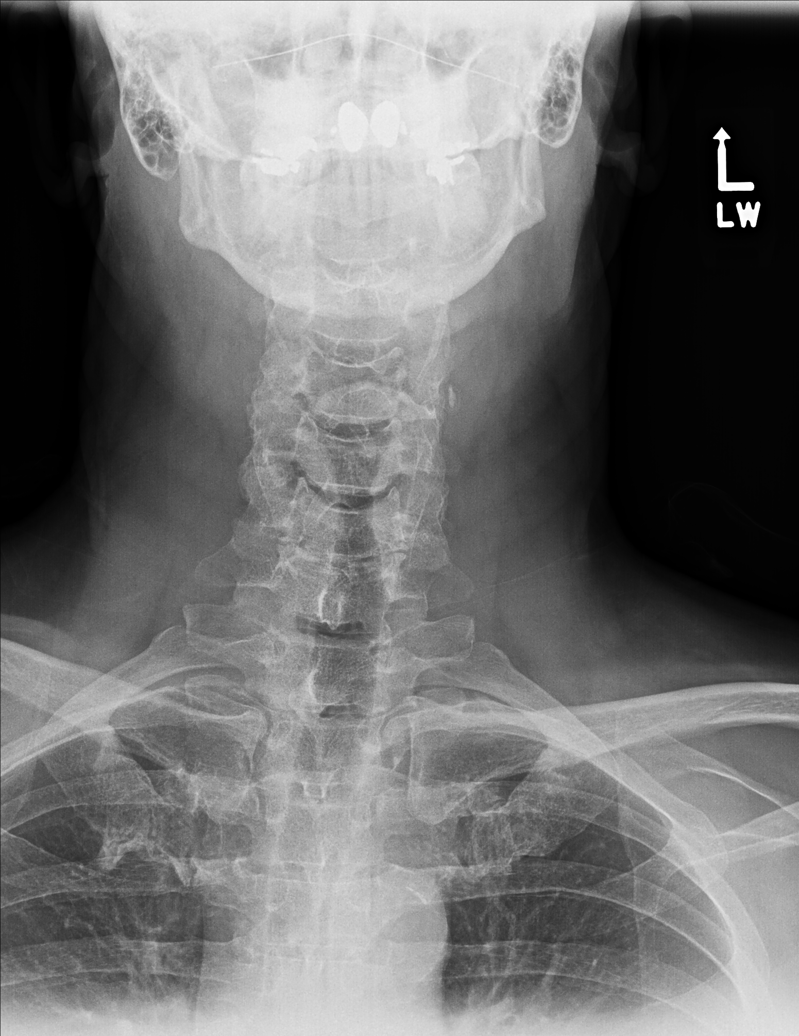

[cervical spine oblique (1 of 2)]
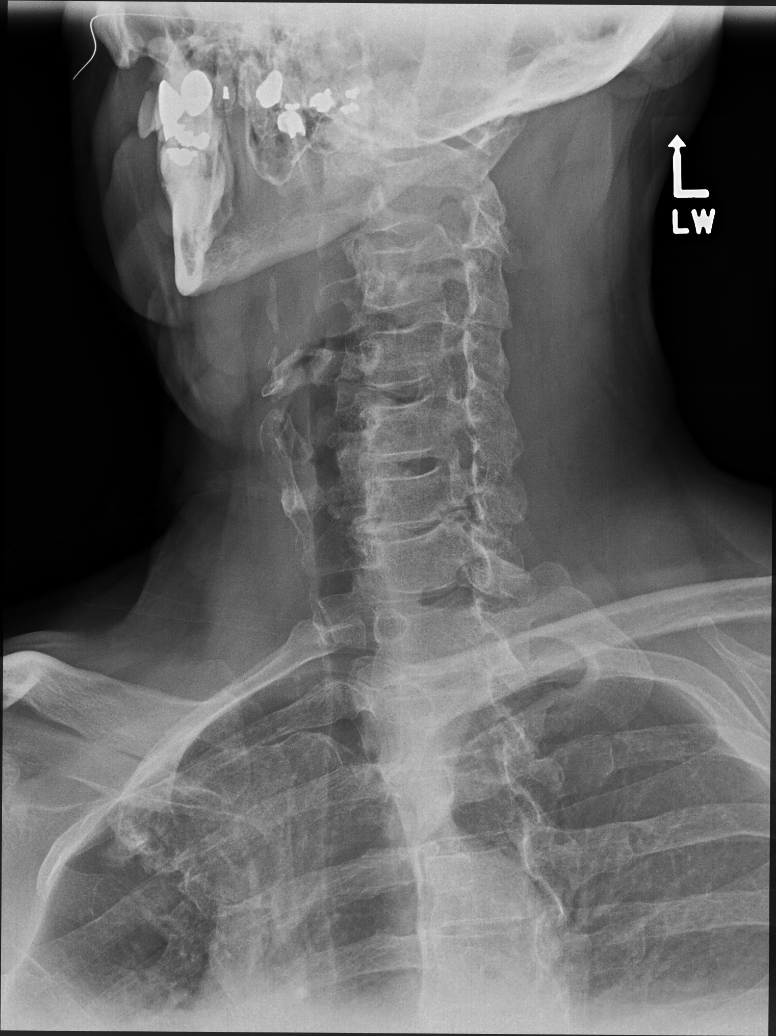

[cervical spine oblique (2 of 2)]
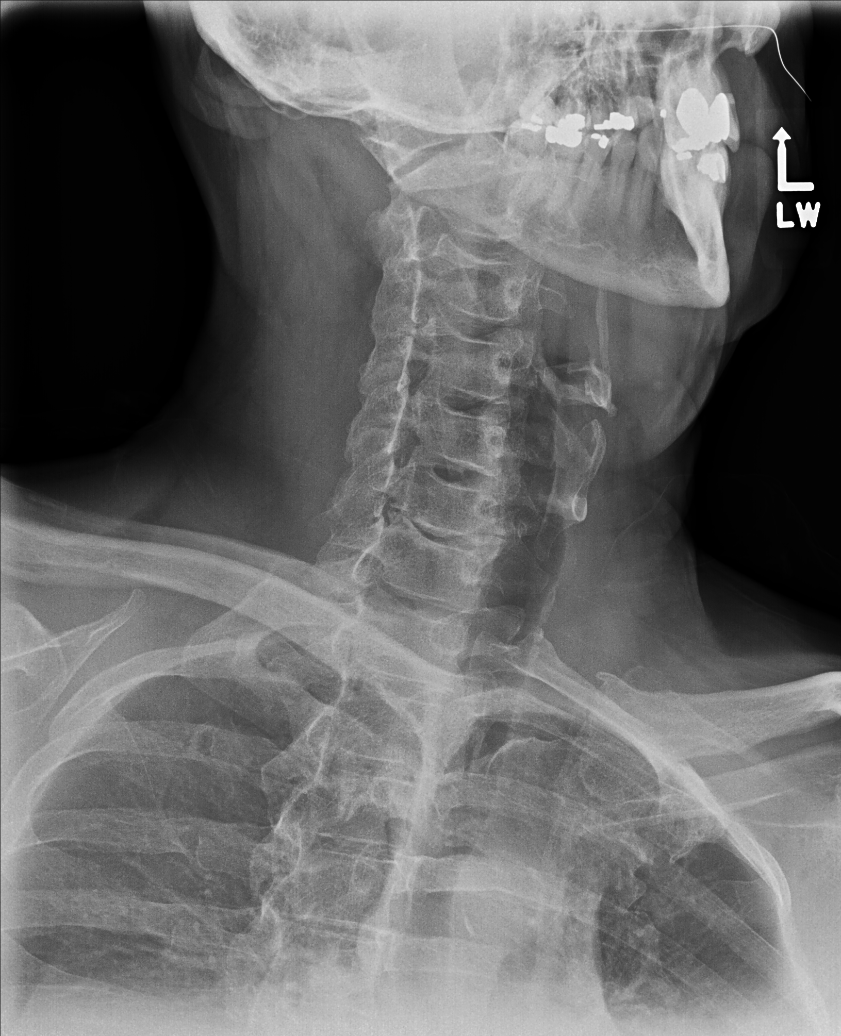

[cervical spine lat]
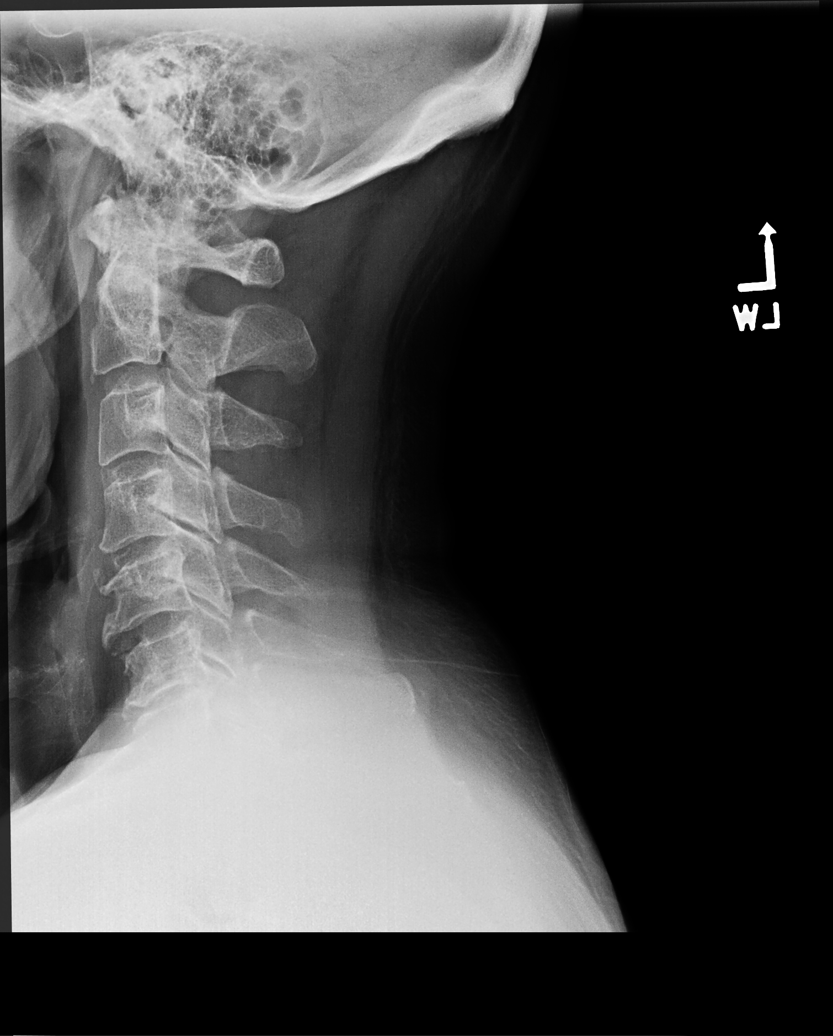

[swimmers lat]
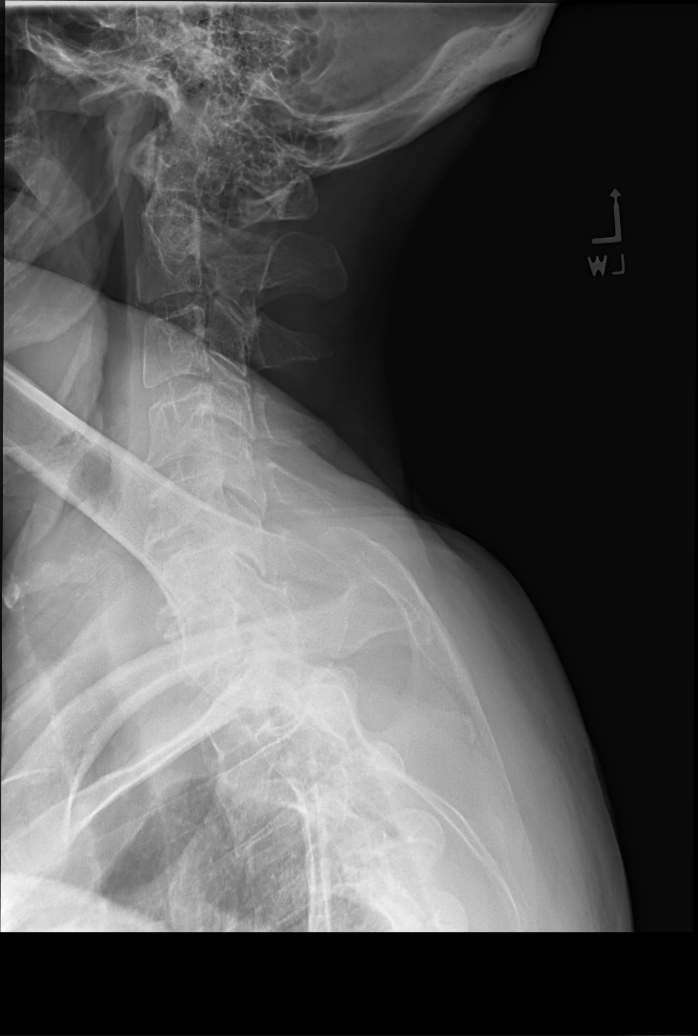

[cervical spine open mouth ap]
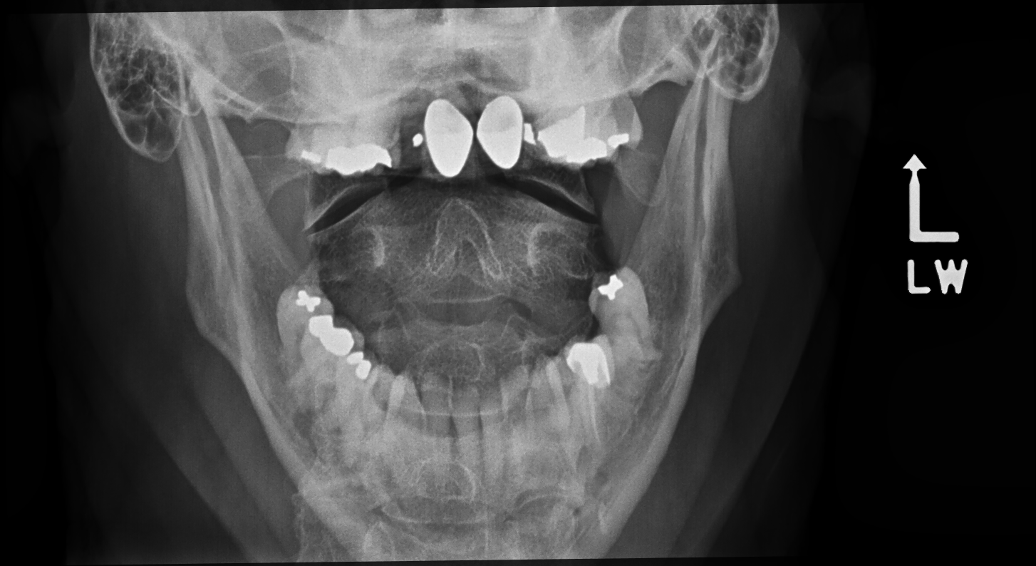

[6 of 6 positions shown; findings below may reference images not displayed]

FINDINGS: Vertebral body alignment and heights are normal. There is mild to
moderate spondylosis of the mid to lower cervical spine. Minimal
disc space narrowing at the C6-7 level. Mild uncovertebral joint
spurring is present worse at the C6-7 level. Moderate bilateral
neural foraminal narrowing at the C6-7 level due to adjacent
spurring.
IMPRESSION: Mild to moderate spondylosis of the cervical spine with minimal disc
disease at the C6-7 level. Moderate bilateral neural foraminal
narrowing at the C6-7 level.

## 2021-03-10 ENCOUNTER — Other Ambulatory Visit: Payer: Self-pay | Admitting: Family Medicine

## 2021-03-10 DIAGNOSIS — I10 Essential (primary) hypertension: Secondary | ICD-10-CM

## 2021-03-19 ENCOUNTER — Ambulatory Visit: Payer: BC Managed Care – PPO | Admitting: Pharmacist

## 2021-03-19 DIAGNOSIS — E782 Mixed hyperlipidemia: Secondary | ICD-10-CM

## 2021-03-19 DIAGNOSIS — I10 Essential (primary) hypertension: Secondary | ICD-10-CM

## 2021-03-19 DIAGNOSIS — E1165 Type 2 diabetes mellitus with hyperglycemia: Secondary | ICD-10-CM

## 2021-03-19 NOTE — Chronic Care Management (AMB) (Signed)
Care Management   Pharmacy Note  03/19/2021 Name: Jonathan West MRN: [id number] DOB: [birth year]  Subjective: Jonathan West is a 60 y.o. year old male who is a primary care patient of Caryl Bis, Angela Adam, MD. The Care Management team was consulted for assistance with care management and care coordination needs.    Engaged with patient by telephone for follow up visit in response to provider referral for pharmacy case management and/or care coordination services.   The patient was given information about Care Management services today including:  Care Management services includes personalized support from designated clinical staff supervised by the patient's primary care provider, including individualized plan of care and coordination with other care providers. 24/7 contact phone numbers for assistance for urgent and routine care needs. The patient may stop case management services at any time by phone call to the office staff.  Patient agreed to services and consent obtained.  Assessment:  Review of patient status, including review of consultants reports, laboratory and other test data, was performed as part of comprehensive evaluation and provision of chronic care management services.   SDOH (Social Determinants of Health) assessments and interventions performed:  SDOH Interventions    Flowsheet Row Most Recent Value  SDOH Interventions   Financial Strain Interventions Other (Comment)  [high deductible insurance plan]        Objective:  Lab Results  Component Value Date   CREATININE 1.17 06/28/2020   CREATININE 1.28 12/25/2019   CREATININE 1.26 09/18/2019    Lab Results  Component Value Date   HGBA1C 8.0 (A) 11/01/2020       Component Value Date/Time   CHOL 118 06/28/2020 1515   TRIG 126 06/28/2020 1515   HDL 33 (L) 06/28/2020 1515   CHOLHDL 3.6 06/28/2020 1515   VLDL 21.4 06/14/2019 1134   LDLCALC 64 06/28/2020 1515   LDLDIRECT 58.0 09/13/2017 1232      Clinical ASCVD: No  The ASCVD Risk score [name], et al., 2013) failed to calculate for the following reasons:   The valid total cholesterol range is 130 to 320 mg/dL      BP Readings from Last 3 Encounters:  01/29/21 120/80  11/01/20 118/78  06/28/20 118/70    Care Plan  Allergies  Allergen Reactions   Metformin Other (See Comments)    Indigestion with immediate release    Medications Reviewed Today     Reviewed by De Hollingshead, RPH-CPP (Pharmacist) on 03/19/21 at 1  Med List Status: <None>   Medication Order Taking? Sig Documenting Provider Last Dose Status Informant  amLODipine (NORVASC) 5 MG tablet [id number] Yes Take 1 tablet by mouth once daily Leone Haven, MD Taking Active   aspirin EC 81 MG tablet [id number] Yes Take 81 mg by mouth daily. [provider] Taking Active   atorvastatin (LIPITOR) 10 MG tablet [id number] Yes Take 1 tablet (10 mg total) by mouth daily. Leone Haven, MD Taking Active   Continuous Blood Gluc Sensor (FREESTYLE LIBRE 2 SENSOR) [place] [id number] Yes USE TO CHECK SUGARS AT LEAST 3 TIMES DAILY [provider] Taking Active   gabapentin (NEURONTIN) 300 MG capsule [id number] Yes Take 1 capsule (300 mg total) by mouth in the morning AND 3 capsules (900 mg total) at bedtime. Leone Haven, MD Taking Active   glimepiride [facility]) 2 MG tablet [id number] Yes Take 1 tablet by mouth twice daily Leone Haven, MD Taking Active   Insulin Glargine [facility]) 100 UNIT/ML [id number] Yes  Inject 52 units daily. Titrate as tolerated. Max daily dose 60 units Leone Haven, MD Taking Active   lisinopril (ZESTRIL) 20 MG tablet [id number] Yes Take 2 tablets by mouth once daily Leone Haven, MD Taking Active   metFORMIN (GLUCOPHAGE-XR) 500 MG 24 hr tablet [id number] Yes TAKE 4 TABLETS BY MOUTH ONCE DAILY WITH BREAKFAST Leone Haven, MD Taking Active   propranolol ER (INDERAL LA) 60 MG 24 hr capsule  [id number] Yes TAKE 1 CAPSULE BY MOUTH EVERY DAY Leone Haven, MD Taking Active   RELION PEN NEEDLES 32G X 4 MM MISC [id number] Yes USE 1 PEN ONCE DAILY USE  WITH  INSULIN  DAILY Leone Haven, MD Taking Active   Vitamin D, Ergocalciferol, 2000 units CAPS [id number] Yes Take by mouth daily. [provider] Taking Active             Patient Active Problem List   Diagnosis Date Noted   History of COVID-19 01/29/2021   Muscle twitch 11/01/2020   Fall 06/28/2020   Radiculopathy 03/27/2020   Scalp abrasion 03/27/2020   Muscle cramps 12/25/2019   Neuropathy 09/18/2019   Multiple nevi 02/09/2018   Hyperlipidemia 11/11/2017   History of anemia 08/11/2017   Lipoma 05/22/2015   Essential hypertension 06/30/2014   Erectile dysfunction associated with type 2 diabetes mellitus ([place]) 03/27/2014   DM (diabetes mellitus), type 2, uncontrolled ([place]) 11/09/2013   B12 deficiency 11/09/2013   Vitamin D deficiency 11/09/2013    Conditions to be addressed/monitored: HTN, HLD, and DMII  Care Plan : General Plan of Care (Adult)  Updates made by De Hollingshead, RPH-CPP since 03/19/2021 12:00 AM     Problem: Disease Management      Long-Range Goal: Disease Progression Prevention   This Visit's Progress: On track  Recent Progress: On track  Priority: High  Note:   Current Barriers:  Unable to independently afford treatment regimen Unable to achieve control of diabetes   Pharmacist Clinical Goal(s):  Over the next 90 days, patient will work with PharmD and provider towards optimized glycemic control  Interventions: 1:1 collaboration with Leone Haven, MD regarding development and update of comprehensive plan of care as evidenced by provider attestation and co-signature Inter-disciplinary care team collaboration (see longitudinal plan of care) Comprehensive medication review performed; medication list updated in electronic medical record  Diabetes: Uncontrolled;  current treatment: metformin 1000 mg BID, glimepiride 2 mg BID though 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, Engineer, 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 [employer] - tolerated, but copay became too expensive when he depleted the 12 fills on [employer] savings card Prescribed Ozempic in 2021, but copay was >$1500 on his insurance, since he had not met his deductible Jardiance/Farxiga - attempted in 11/2020, but cost was >$550 on his insurance, since he had not met his deductible Peripheral neuropathy: gabapentin 300 mg QAM, 900 mg QPM Current glucose readings: using Libre 2 CGM  Date of Download: 6/30-7/13/22 % Time CGM is active: 21% Average Glucose: 218 mg/dL Time in Goal:  - Time in range 70-180: 28% - Time above range: 72% - Time below range: 0% Observed patterns: significant post prandial elevations.   Reviewed CGM. Reviewed need to scan at least [facility] for full glucose download. Encouraged to do so for at least the 2 weeks prior to upcoming endocrinology appointment.  In case coverage has changed, provided patient with names of several antihyperglycemic medications for him to investigate copays of prior to appointment with Dr. Honor Junes to have a  sense of any affordable options.   Hypertension: Controlled per last clinic reading; current treatment: amlodipine 5 mg QAM, lisinopril 40 mg QAM, propranolol 60 mg daily;  Previously recommended continuation of current regimen  Hyperlipidemia: Controlled per last lipid panel; current treatment: atorvastatin 10 mg daily;  Concurrent aspirin 81 mg daily for ASCVD risk reduction Previously recommended continuation of current regimen   Patient Goals/Self-Care Activities Over the next 90 days, patient will:  - check glucose using CGM, document, and provide at future appointments - collaborate with provider on medication access solutions  Follow Up Plan: DM management transferred to endocrinology. Patient will contact me if he has questions or concerns moving  forward.       Medication Assistance:  High combined pharmacy and medical deductible  Follow Up:  Patient requests no follow-up at this time.  Plan: DM management transferred to endocrinology. Patient will contact me if he has questions or concerns moving forward.   [name], PharmD, [place], [place] Clinical Pharmacist [employer] at [place]

## 2021-03-19 NOTE — Patient Instructions (Signed)
Visit Information   Goals Addressed               This Visit's Progress     Patient Stated     Medication Monitoring (pt-stated)        Patient Goals/Self-Care Activities Over the next 90 days, patient will:  - check glucose using CGM, document, and provide at future appointments - collaborate with provider on medication access solutions          Patient verbalizes understanding of instructions provided today and agrees to view in [place].    Plan: DM management transferred to endocrinology. Patient will contact me if he has questions or concerns moving forward.   [name], PharmD, [place], [place] Clinical Pharmacist [employer] at [place]

## 2021-03-24 ENCOUNTER — Other Ambulatory Visit: Payer: Self-pay | Admitting: Family Medicine

## 2021-04-14 ENCOUNTER — Other Ambulatory Visit: Payer: Self-pay | Admitting: Family Medicine

## 2021-04-14 DIAGNOSIS — E1165 Type 2 diabetes mellitus with hyperglycemia: Secondary | ICD-10-CM

## 2021-04-28 ENCOUNTER — Other Ambulatory Visit: Payer: Self-pay | Admitting: Family Medicine

## 2021-04-29 DIAGNOSIS — E119 Type 2 diabetes mellitus without complications: Secondary | ICD-10-CM | POA: Diagnosis not present

## 2021-04-29 DIAGNOSIS — Z794 Long term (current) use of insulin: Secondary | ICD-10-CM | POA: Diagnosis not present

## 2021-05-02 ENCOUNTER — Ambulatory Visit: Payer: BC Managed Care – PPO | Admitting: Family Medicine

## 2021-05-25 ENCOUNTER — Other Ambulatory Visit: Payer: Self-pay | Admitting: Family Medicine

## 2021-05-25 DIAGNOSIS — E1165 Type 2 diabetes mellitus with hyperglycemia: Secondary | ICD-10-CM

## 2021-06-09 ENCOUNTER — Other Ambulatory Visit: Payer: Self-pay | Admitting: Family Medicine

## 2021-06-15 ENCOUNTER — Other Ambulatory Visit: Payer: Self-pay | Admitting: Family Medicine

## 2021-06-15 DIAGNOSIS — I10 Essential (primary) hypertension: Secondary | ICD-10-CM

## 2021-07-09 DIAGNOSIS — J111 Influenza due to unidentified influenza virus with other respiratory manifestations: Secondary | ICD-10-CM | POA: Diagnosis not present

## 2021-07-09 DIAGNOSIS — Z20822 Contact with and (suspected) exposure to covid-19: Secondary | ICD-10-CM | POA: Diagnosis not present

## 2021-07-09 DIAGNOSIS — R059 Cough, unspecified: Secondary | ICD-10-CM | POA: Diagnosis not present

## 2021-07-19 ENCOUNTER — Other Ambulatory Visit: Payer: Self-pay | Admitting: Family Medicine

## 2021-07-19 DIAGNOSIS — E1165 Type 2 diabetes mellitus with hyperglycemia: Secondary | ICD-10-CM

## 2021-07-27 ENCOUNTER — Other Ambulatory Visit: Payer: Self-pay | Admitting: Family Medicine

## 2021-08-12 DIAGNOSIS — E119 Type 2 diabetes mellitus without complications: Secondary | ICD-10-CM | POA: Diagnosis not present

## 2021-08-12 DIAGNOSIS — Z794 Long term (current) use of insulin: Secondary | ICD-10-CM | POA: Diagnosis not present

## 2021-08-12 DIAGNOSIS — E1169 Type 2 diabetes mellitus with other specified complication: Secondary | ICD-10-CM | POA: Diagnosis not present

## 2021-08-12 DIAGNOSIS — E1159 Type 2 diabetes mellitus with other circulatory complications: Secondary | ICD-10-CM | POA: Diagnosis not present

## 2021-08-24 ENCOUNTER — Other Ambulatory Visit: Payer: Self-pay | Admitting: Family Medicine

## 2021-09-02 NOTE — Progress Notes (Signed)
I called to Dr. Rise Mu office and requested the diabetic eye exam and they are faxing it to the office.  Dynasia Kercheval,cma

## 2021-09-28 ENCOUNTER — Other Ambulatory Visit: Payer: Self-pay | Admitting: Family Medicine

## 2021-09-28 DIAGNOSIS — I10 Essential (primary) hypertension: Secondary | ICD-10-CM

## 2021-10-27 ENCOUNTER — Other Ambulatory Visit: Payer: Self-pay | Admitting: Family Medicine

## 2021-11-06 ENCOUNTER — Other Ambulatory Visit: Payer: Self-pay | Admitting: Family Medicine

## 2021-11-06 DIAGNOSIS — E1165 Type 2 diabetes mellitus with hyperglycemia: Secondary | ICD-10-CM

## 2021-11-11 ENCOUNTER — Ambulatory Visit: Payer: Self-pay | Admitting: Pharmacist

## 2021-11-11 NOTE — Chronic Care Management (AMB) (Signed)
?  Chronic Care Management  ? ?Note ? ?11/11/2021 ?Name: BERNHARDT RIEMENSCHNEIDER MRN: [id number] DOB: 1960/09/13 ? ? ? ?Closing pharmacy CCM case at this time. Patient has clinic contact information for future questions or concerns.  ? ?[name], PharmD, [place], CPP ?Clinical Pharmacist ?Therapist, music at [employer] ?[phone] ? ?

## 2021-11-26 ENCOUNTER — Other Ambulatory Visit: Payer: Self-pay | Admitting: Family Medicine

## 2021-11-26 DIAGNOSIS — E1165 Type 2 diabetes mellitus with hyperglycemia: Secondary | ICD-10-CM

## 2021-11-27 ENCOUNTER — Encounter: Payer: Self-pay | Admitting: Family Medicine

## 2021-12-21 ENCOUNTER — Other Ambulatory Visit: Payer: Self-pay | Admitting: Family Medicine

## 2021-12-23 ENCOUNTER — Other Ambulatory Visit: Payer: Self-pay | Admitting: Family Medicine

## 2021-12-23 DIAGNOSIS — I10 Essential (primary) hypertension: Secondary | ICD-10-CM

## 2021-12-24 NOTE — Telephone Encounter (Signed)
This patient needs follow-up scheduled. Please call him to get this set up. 90 day supply sent to pharmacy.  ?

## 2021-12-25 NOTE — Telephone Encounter (Signed)
Patient is scheduled for a follow up.  Jonathan West,cma  ?

## 2022-01-07 ENCOUNTER — Other Ambulatory Visit: Payer: Self-pay | Admitting: Family Medicine

## 2022-01-07 DIAGNOSIS — E1165 Type 2 diabetes mellitus with hyperglycemia: Secondary | ICD-10-CM

## 2022-01-09 ENCOUNTER — Ambulatory Visit (INDEPENDENT_AMBULATORY_CARE_PROVIDER_SITE_OTHER): Payer: BC Managed Care – PPO | Admitting: Family Medicine

## 2022-01-09 ENCOUNTER — Encounter: Payer: Self-pay | Admitting: Family Medicine

## 2022-01-09 VITALS — BP 120/60 | HR 86 | Temp 99.5°F | Ht 70.0 in | Wt 191.2 lb

## 2022-01-09 DIAGNOSIS — K625 Hemorrhage of anus and rectum: Secondary | ICD-10-CM | POA: Diagnosis not present

## 2022-01-09 DIAGNOSIS — I10 Essential (primary) hypertension: Secondary | ICD-10-CM | POA: Diagnosis not present

## 2022-01-09 DIAGNOSIS — J309 Allergic rhinitis, unspecified: Secondary | ICD-10-CM

## 2022-01-09 DIAGNOSIS — E782 Mixed hyperlipidemia: Secondary | ICD-10-CM | POA: Diagnosis not present

## 2022-01-09 DIAGNOSIS — E1165 Type 2 diabetes mellitus with hyperglycemia: Secondary | ICD-10-CM | POA: Diagnosis not present

## 2022-01-09 NOTE — Patient Instructions (Signed)
Nice to see you. ?We will have you come back for labs. ?Somebody from GI should contact you. ?You can try Zyrtec or Claritin over-the-counter for your allergies.  If that is not helpful you can try over-the-counter Flonase. ? ?

## 2022-01-11 ENCOUNTER — Other Ambulatory Visit: Payer: Self-pay | Admitting: Internal Medicine

## 2022-01-11 ENCOUNTER — Other Ambulatory Visit: Payer: Self-pay | Admitting: Family Medicine

## 2022-01-11 DIAGNOSIS — I10 Essential (primary) hypertension: Secondary | ICD-10-CM

## 2022-01-12 ENCOUNTER — Other Ambulatory Visit (INDEPENDENT_AMBULATORY_CARE_PROVIDER_SITE_OTHER): Payer: BC Managed Care – PPO

## 2022-01-12 DIAGNOSIS — E782 Mixed hyperlipidemia: Secondary | ICD-10-CM | POA: Diagnosis not present

## 2022-01-12 DIAGNOSIS — E1165 Type 2 diabetes mellitus with hyperglycemia: Secondary | ICD-10-CM | POA: Diagnosis not present

## 2022-01-12 DIAGNOSIS — J309 Allergic rhinitis, unspecified: Secondary | ICD-10-CM | POA: Insufficient documentation

## 2022-01-12 DIAGNOSIS — K625 Hemorrhage of anus and rectum: Secondary | ICD-10-CM | POA: Insufficient documentation

## 2022-01-12 NOTE — Progress Notes (Signed)
?[name], MD ?Phone: [phone] ? ?DOCK BACCAM is a 61 y.o. male who presents today for f/u. ? ?HYPERTENSION ?Disease Monitoring ?Home BP Monitoring not checking Chest pain- no    Dyspnea- no ?Medications ?Compliance-  taking amlodipine, lisinopril, propranolol.  Edema- no ?BMET ?   ?Component Value Date/Time  ? NA 140 06/28/2020 1515  ? K 4.1 06/28/2020 1515  ? CL 107 06/28/2020 1515  ? CO2 24 06/28/2020 1515  ? GLUCOSE 98 06/28/2020 1515  ? BUN 15 06/28/2020 1515  ? CREATININE 1.17 06/28/2020 1515  ? CALCIUM 9.6 06/28/2020 1515  ? GFRNONAA >60 08/15/2015 0856  ? GFRAA >60 08/15/2015 0856  ? ?DIABETES ?Disease Monitoring: ?Blood Sugar ranges-160-180, excursions to the 300s Polyuria/phagia/dipsia- no      Optho- last fall ?Medications: ?Compliance- taking glimeperide, basaglar, metformin Hypoglycemic symptoms- no ? ?History of hemorrhoids: reports occasionally seeing BRBPR.  ? ?Allergies: notes he is not on medications. Gets postnasal drip. Some cough. Some rhinorrhea.  ? ? ?Social History  ? ?Tobacco Use  ?Smoking Status Never  ?Smokeless Tobacco Never  ? ? ?Current Outpatient Medications on File Prior to Visit  ?Medication Sig Dispense Refill  ? aspirin EC 81 MG tablet Take 81 mg by mouth daily.    ? Continuous Blood Gluc Sensor (FREESTYLE LIBRE 2 SENSOR) MISC USE TO CHECK SUGARS AT LEAST THREE TIMES DAILY (CHANGE OUT EVERY 14 DAYS) 2 each 3  ? gabapentin (NEURONTIN) 300 MG capsule TAKE 1 CAPSULE BY MOUTH IN THE MORNING AND 3 AT BEDTIME 360 capsule 0  ? glimepiride (AMARYL) 2 MG tablet Take 1 tablet by mouth twice daily 180 tablet 0  ? Insulin Glargine (BASAGLAR KWIKPEN) 100 UNIT/ML INJECT 52 UNITS SUBCUTANEOUSLY  ONCE DAILY .  TITRATE  AS  TOLERATED.  MAX  DAILY  DOSE  OF  60  UNITS. 60 mL 0  ? metFORMIN (GLUCOPHAGE-XR) 500 MG 24 hr tablet TAKE 4 TABLETS BY MOUTH ONCE DAILY WITH BREAKFAST 360 tablet 0  ? propranolol ER (INDERAL LA) 60 MG 24 hr capsule Take 1 capsule by mouth once daily 90 capsule 0   ? RELION PEN NEEDLES 32G X 4 MM MISC USE WITH INSULIN DAILY AS DIRECTED 100 each 0  ? Vitamin D, Ergocalciferol, 2000 units CAPS Take by mouth daily.    ? ?No current facility-administered medications on file prior to visit.  ? ? ? ?ROS see history of present illness ? ?Objective ? ?Physical Exam ?Vitals:  ? 01/09/22 1644  ?BP: 120/60  ?Pulse: 86  ?Temp: 99.5 ?F (37.5 ?C)  ?SpO2: 96%  ? ? ?BP Readings from Last 3 Encounters:  ?01/09/22 120/60  ?01/29/21 120/80  ?11/01/20 118/78  ? ?Wt Readings from Last 3 Encounters:  ?01/09/22 191 lb 3.2 oz (86.7 kg)  ?01/29/21 189 lb (85.7 kg)  ?11/01/20 179 lb 6.4 oz (81.4 kg)  ? ? ?Physical Exam ?Constitutional:   ?   General: He is not in acute distress. ?   Appearance: He is not diaphoretic.  ?Cardiovascular:  ?   Rate and Rhythm: Normal rate and regular rhythm.  ?   Heart sounds: Normal heart sounds.  ?Pulmonary:  ?   Effort: Pulmonary effort is normal.  ?   Breath sounds: Normal breath sounds.  ?Skin: ?   General: Skin is warm and dry.  ?Neurological:  ?   Mental Status: He is alert.  ? ? ? ?Assessment/Plan: Please see individual problem list. ? ?Problem List Items Addressed This Visit   ? ?  Essential hypertension (Chronic)  ?  Well controlled. He will continue lisinopril 20 mg daily, amlodipine 5 mg daily, and propranolol 60 mg daily. He will return for labs.  ? ?  ?  ? Hyperlipidemia (Chronic)  ? Relevant Orders  ? Comp Met (CMET)  ? Lipid panel  ? Uncontrolled diabetes mellitus with hyperglycemia (HCC) - Primary (Chronic)  ?  Uncontrolled. He will return for labs. Discussed possibly starting on ozempic though he notes a possible family history of thyroid cancer. He will confirm this and see what type it was prior to considering ozempic. He will continue basaglar 52 u daily, glimeperide 2 mg BID, and metformin XR 2000 mg daily. ? ?  ?  ? Relevant Orders  ? HgB A1c  ? Allergic rhinitis  ?   Will trial OTC zyrtec or claritin. If those are not beneficial he will add on OTC  flonase.  ? ?  ?  ? BRBPR (bright red blood per rectum)  ?  Possibly related to hemorrhoids. Refer to GI.  ? ?  ?  ? Relevant Orders  ? Ambulatory referral to Gastroenterology  ? ? ? ?Health Maintenance: requesting optho records.  ? ?Return in about 3 months (around 04/11/2022) for Diabetes. ? ? ?[name], MD ?[place] ? ?

## 2022-01-12 NOTE — Assessment & Plan Note (Signed)
Uncontrolled. He will return for labs. Discussed possibly starting on ozempic though he notes a possible family history of thyroid cancer. He will confirm this and see what type it was prior to considering ozempic. He will continue basaglar 52 u daily, glimeperide 2 mg BID, and metformin XR 2000 mg daily. ?

## 2022-01-12 NOTE — Assessment & Plan Note (Signed)
Will trial OTC zyrtec or claritin. If those are not beneficial he will add on OTC flonase.  ?

## 2022-01-12 NOTE — Assessment & Plan Note (Signed)
Possibly related to hemorrhoids. Refer to GI.  ?

## 2022-01-12 NOTE — Assessment & Plan Note (Signed)
Well controlled. He will continue lisinopril 20 mg daily, amlodipine 5 mg daily, and propranolol 60 mg daily. He will return for labs.  ?

## 2022-01-13 LAB — COMPREHENSIVE METABOLIC PANEL
ALT: 21 U/L (ref 0–53)
AST: 21 U/L (ref 0–37)
Albumin: 4.3 g/dL (ref 3.5–5.2)
Alkaline Phosphatase: 98 U/L (ref 39–117)
BUN: 20 mg/dL (ref 6–23)
CO2: 23 mEq/L (ref 19–32)
Calcium: 9.5 mg/dL (ref 8.4–10.5)
Chloride: 105 mEq/L (ref 96–112)
Creatinine, Ser: 1.41 mg/dL (ref 0.40–1.50)
GFR: 53.24 mL/min — ABNORMAL LOW (ref >60.00)
Glucose, Bld: 111 mg/dL — ABNORMAL HIGH (ref 70–99)
Potassium: 4.1 mEq/L (ref 3.5–5.1)
Sodium: 139 mEq/L (ref 135–145)
Total Bilirubin: 1 mg/dL (ref 0.2–1.2)
Total Protein: 7 g/dL (ref 6.0–8.3)

## 2022-01-13 LAB — LIPID PANEL
Cholesterol: 106 mg/dL (ref 0–200)
HDL: 29.5 mg/dL — ABNORMAL LOW (ref >39.00)
LDL Cholesterol: 64 mg/dL (ref 0–99)
NonHDL: 76.38
Total CHOL/HDL Ratio: 4
Triglycerides: 63 mg/dL (ref 0.0–149.0)
VLDL: 12.6 mg/dL (ref 0.0–40.0)

## 2022-01-13 LAB — HEMOGLOBIN A1C: Hgb A1c MFr Bld: 8.1 % — ABNORMAL HIGH (ref 4.6–6.5)

## 2022-01-15 NOTE — Progress Notes (Signed)
Patient is scheduled for a 3 month follow up. Jonathan West,cma  ?

## 2022-01-16 ENCOUNTER — Encounter: Payer: Self-pay | Admitting: Family Medicine

## 2022-02-01 ENCOUNTER — Other Ambulatory Visit: Payer: Self-pay | Admitting: Family

## 2022-02-01 DIAGNOSIS — E1165 Type 2 diabetes mellitus with hyperglycemia: Secondary | ICD-10-CM

## 2022-03-01 ENCOUNTER — Other Ambulatory Visit: Payer: Self-pay | Admitting: Family Medicine

## 2022-03-01 DIAGNOSIS — I10 Essential (primary) hypertension: Secondary | ICD-10-CM

## 2022-03-12 ENCOUNTER — Other Ambulatory Visit: Payer: Self-pay | Admitting: Family Medicine

## 2022-03-15 ENCOUNTER — Other Ambulatory Visit: Payer: Self-pay | Admitting: Family Medicine

## 2022-03-15 DIAGNOSIS — I10 Essential (primary) hypertension: Secondary | ICD-10-CM

## 2022-04-10 ENCOUNTER — Other Ambulatory Visit: Payer: Self-pay | Admitting: Family Medicine

## 2022-04-17 ENCOUNTER — Encounter: Payer: Self-pay | Admitting: Family Medicine

## 2022-04-17 ENCOUNTER — Ambulatory Visit (INDEPENDENT_AMBULATORY_CARE_PROVIDER_SITE_OTHER): Payer: BC Managed Care – PPO | Admitting: Family Medicine

## 2022-04-17 DIAGNOSIS — J309 Allergic rhinitis, unspecified: Secondary | ICD-10-CM

## 2022-04-17 DIAGNOSIS — K625 Hemorrhage of anus and rectum: Secondary | ICD-10-CM

## 2022-04-17 DIAGNOSIS — E1165 Type 2 diabetes mellitus with hyperglycemia: Secondary | ICD-10-CM

## 2022-04-17 MED ORDER — OZEMPIC (0.25 OR 0.5 MG/DOSE) 2 MG/3ML ~~LOC~~ SOPN: PEN_INJECTOR | SUBCUTANEOUS | 2 refills | Status: DC

## 2022-04-17 NOTE — Assessment & Plan Note (Signed)
Cough may be allergy related.  He will trial over-the-counter Allegra and Flonase.  If those are not beneficial or if his symptoms are worsening he will let me know on Monday.

## 2022-04-17 NOTE — Assessment & Plan Note (Addendum)
Refer to GI.  Advised to seek medical attention for excessive bleeding development of chest pain, shortness of breath, or other symptoms related to bleeding.

## 2022-04-17 NOTE — Assessment & Plan Note (Signed)
Uncontrolled.  He will continue Basaglar 52 units daily, glimepiride 2 mg twice daily, and metformin XR 2000 mg daily.   We will start the patient on ozempic.  Patient confirms no personal history of pancreatitis or gallbladder disease.  They were counseled on the risk of pancreatitis and gallbladder disease.  Discussed the risk of nausea.  They were advised to discontinue the ozempic and contact us immediately if they develop abdominal pain.  If they develop excessive nausea they will contact us right away.  I discussed that medullary thyroid cancer has been seen in rats studies.  The patient confirmed no personal or family history of thyroid cancer, parathyroid cancer, or adrenal gland cancer.  Discussed that we thus far have not seen medullary thyroid cancer result from use of this type of medication in humans.  They were advised to monitor the thyroid area and contact us for any lumps, swelling, trouble swallowing, or any other changes in this area.

## 2022-04-17 NOTE — Progress Notes (Signed)
Virtual Visit via telephone Note  This visit type was conducted due to national recommendations for restrictions regarding the COVID-19 pandemic (e.g. social distancing).  This format is felt to be most appropriate for this patient at this time.  All issues noted in this document were discussed and addressed.  No physical exam was performed (except for noted visual exam findings with Video Visits).   I connected with Jonathan West today at  4:30 PM EDT by a telephone and verified that I am speaking with the correct person using two identifiers. Location patient: car Location provider: work  Persons participating in the virtual visit: patient, provider  I discussed the limitations, risks, security and privacy concerns of performing an evaluation and management service by telephone and the availability of in person appointments. I also discussed with the patient that there may be a patient responsible charge related to this service. The patient expressed understanding and agreed to proceed.  Interactive audio and video telecommunications were attempted between this provider and patient, however failed, due to patient having technical difficulties OR patient did not have access to video capability.  We continued and completed visit with audio only.   Reason for visit: Follow-up.  HPI: Cough: Patient notes this started about a week ago.  He been outside working and felt as though it was likely allergies.  Has been hoarse.  There is been no sore throat, fever, congestion, sneezing.  He notes he does not feel bad.  He has minimal postnasal drip.  He notes the last few days he feels a little bit better than he did earlier in the week.  He has not taken any medications for this.  He did do a home COVID test this morning and it was negative.  Diabetes: Has been in the low 200s.  He is taking Basaglar 52 units daily, glimepiride, and metformin.  Hemorrhoids: Patient has a chronic history of  hemorrhoids.  He notes occasional blood in his stool over the past 1 to 2 years though few weeks ago he was [place] the beach and had blood in the toilet on multiple days.   ROS: See pertinent positives and negatives per HPI.  Past Medical History:  Diagnosis Date   Complication of anesthesia    migraine   Diabetes mellitus without complication ([place])    Hyperlipidemia    Hypertension    Migraines     Past Surgical History:  Procedure Laterality Date   COLONOSCOPY     EXCISION MASS NECK Left 09/12/2015   Procedure: EXCISION MASS NECK;  Surgeon: Carloyn Manner, MD;  Location: [facility] ORS;  Service: ENT;  Laterality: Left;   HEMORRHOID SURGERY  2005   [place] [address] Radene Knee     Family History  Problem Relation Age of Onset   Heart disease Father    Diabetes Father    Heart attack Father    Stroke Mother    Heart disease Maternal Grandfather    Heart attack Maternal Grandfather    Heart disease Paternal Grandfather    Heart attack Paternal Grandfather     SOCIAL HX: Non-smoker   Current Outpatient Medications:    amLODipine (NORVASC) 5 MG tablet, Take 1 tablet by mouth once daily, Disp: 90 tablet, Rfl: 0   aspirin EC 81 MG tablet, Take 81 mg by mouth daily., Disp: , Rfl:    atorvastatin (LIPITOR) 10 MG tablet, Take 1 tablet by mouth once daily, Disp: 90 tablet, Rfl: 0   gabapentin (NEURONTIN) 300 MG  capsule, TAKE 1 CAPSULE BY MOUTH IN THE MORNING AND 3 AT BEDTIME, Disp: 360 capsule, Rfl: 0   glimepiride (AMARYL) 2 MG tablet, Take 1 tablet by mouth twice daily, Disp: 180 tablet, Rfl: 0   Insulin Glargine (BASAGLAR KWIKPEN) 100 UNIT/ML, INJECT 52 UNITS SUBCUTANEOUSLY  ONCE DAILY .  TITRATE  AS  TOLERATED.  MAX  DAILY  DOSE  OF  60  UNITS., Disp: 60 mL, Rfl: 0   lisinopril (ZESTRIL) 20 MG tablet, Take 2 tablets by mouth once daily, Disp: 180 tablet, Rfl: 0   metFORMIN (GLUCOPHAGE-XR) 500 MG 24 hr tablet, TAKE 4 TABLETS BY MOUTH ONCE DAILY WITH BREAKFAST (NEEDS  APPOINTMENT  FOR  REFILLS),  Disp: 360 tablet, Rfl: 0   propranolol ER (INDERAL LA) 60 MG 24 hr capsule, Take 1 capsule by mouth once daily, Disp: 90 capsule, Rfl: 0   RELION PEN NEEDLES 32G X 4 MM MISC, USE AS DIRECTED WITH  INSULIN  DAILY, Disp: 100 each, Rfl: 0   Semaglutide,0.25 or 0.'5MG'$ /DOS, (OZEMPIC, 0.25 OR 0.5 MG/DOSE,) 2 MG/3ML SOPN, Inject 0.25 mg into the skin once a week for 28 days, THEN 0.5 mg once a week., Disp: 3 mL, Rfl: 2   Vitamin D, Ergocalciferol, 2000 units CAPS, Take by mouth daily., Disp: , Rfl:   EXAM: Telephone visit and thus no exam was completed.  ASSESSMENT AND PLAN:  Discussed the following assessment and plan:  Problem List Items Addressed This Visit     Allergic rhinitis (Chronic)    Cough may be allergy related.  He will trial over-the-counter Allegra and Flonase.  If those are not beneficial or if his symptoms are worsening he will let me know on Monday.      BRBPR (bright red blood per rectum) (Chronic)    Refer to GI.  Advised to seek medical attention for excessive bleeding development of chest pain, shortness of breath, or other symptoms related to bleeding.      Relevant Orders   Ambulatory referral to Gastroenterology   Uncontrolled diabetes mellitus with hyperglycemia ([place]) (Chronic)    Uncontrolled.  He will continue Basaglar 52 units daily, glimepiride 2 mg twice daily, and metformin XR 2000 mg daily.   We will start the patient on ozempic.  Patient confirms no personal history of pancreatitis or gallbladder disease.  They were counseled on the risk of pancreatitis and gallbladder disease.  Discussed the risk of nausea.  They were advised to discontinue the ozempic and contact us immediately if they develop abdominal pain.  If they develop excessive nausea they will contact us right away.  I discussed that medullary thyroid cancer has been seen in rats studies.  The patient confirmed no personal or family history of thyroid cancer, parathyroid cancer, or adrenal gland  cancer.  Discussed that we thus far have not seen medullary thyroid cancer result from use of this type of medication in humans.  They were advised to monitor the thyroid area and contact us for any lumps, swelling, trouble swallowing, or any other changes in this area.      Relevant Medications   Semaglutide,0.25 or 0.'5MG'$ /DOS, (OZEMPIC, 0.25 OR 0.5 MG/DOSE,) 2 MG/3ML SOPN    No follow-ups on file.   I discussed the assessment and treatment plan with the patient. The patient was provided an opportunity to ask questions and all were answered. The patient agreed with the plan and demonstrated an understanding of the instructions.   The patient was advised to call back or  seek an in-person evaluation if the symptoms worsen or if the condition fails to improve as anticipated.  I provided 17 minutes of non-face-to-face time during this encounter.   [name], MD

## 2022-04-19 ENCOUNTER — Other Ambulatory Visit: Payer: Self-pay | Admitting: Family Medicine

## 2022-04-19 DIAGNOSIS — E1165 Type 2 diabetes mellitus with hyperglycemia: Secondary | ICD-10-CM

## 2022-04-20 ENCOUNTER — Encounter: Payer: Self-pay | Admitting: Family Medicine

## 2022-04-20 MED ORDER — AZITHROMYCIN 250 MG PO TABS: ORAL_TABLET | ORAL | 0 refills | Status: AC

## 2022-04-21 MED ORDER — TIRZEPATIDE 5 MG/0.5ML ~~LOC~~ SOAJ: 5.0000 mg | SUBCUTANEOUS | 1 refills | Status: DC

## 2022-04-21 MED ORDER — TIRZEPATIDE 2.5 MG/0.5ML ~~LOC~~ SOAJ: 2.5000 mg | SUBCUTANEOUS | 0 refills | Status: DC

## 2022-04-28 ENCOUNTER — Other Ambulatory Visit: Payer: Self-pay | Admitting: Internal Medicine

## 2022-05-13 ENCOUNTER — Other Ambulatory Visit: Payer: Self-pay | Admitting: Family Medicine

## 2022-05-13 DIAGNOSIS — E1165 Type 2 diabetes mellitus with hyperglycemia: Secondary | ICD-10-CM

## 2022-05-18 ENCOUNTER — Other Ambulatory Visit: Payer: Self-pay | Admitting: Family Medicine

## 2022-05-18 DIAGNOSIS — E1165 Type 2 diabetes mellitus with hyperglycemia: Secondary | ICD-10-CM

## 2022-05-27 ENCOUNTER — Other Ambulatory Visit: Payer: Self-pay | Admitting: Family

## 2022-05-27 DIAGNOSIS — I10 Essential (primary) hypertension: Secondary | ICD-10-CM

## 2022-06-07 ENCOUNTER — Other Ambulatory Visit: Payer: Self-pay | Admitting: Family Medicine

## 2022-06-08 ENCOUNTER — Other Ambulatory Visit: Payer: Self-pay | Admitting: Family

## 2022-06-08 DIAGNOSIS — E1165 Type 2 diabetes mellitus with hyperglycemia: Secondary | ICD-10-CM

## 2022-06-09 ENCOUNTER — Encounter: Payer: Self-pay | Admitting: Family Medicine

## 2022-06-09 DIAGNOSIS — E1165 Type 2 diabetes mellitus with hyperglycemia: Secondary | ICD-10-CM

## 2022-06-09 DIAGNOSIS — I10 Essential (primary) hypertension: Secondary | ICD-10-CM

## 2022-06-09 NOTE — Telephone Encounter (Signed)
At this point all the preferred options will likely be cost prohibitive based on our prior discussions. I think he needs to follow-up with endocrinology to help manage his diabetes as it has been hard to find affordable medications and get his sugar under control. Hs saw [facility] endocrinology last year so he should be able to call them to schedule follow-up.

## 2022-06-10 NOTE — Telephone Encounter (Signed)
Was the Mounjaro 5 mg dose not affordable?  It seems odd to me that the 5 mg dose of the 2.5 mg dose would have different costs.  I believe there is a coupon card for the [facility] that he could try for the 5 mg dose.  2.5 mg dose is typically not overly effective in patients oftentimes need to have a higher dose to get the best benefit.

## 2022-06-12 NOTE — Telephone Encounter (Signed)
Please let me know the outcome of the PA.

## 2022-06-14 ENCOUNTER — Other Ambulatory Visit: Payer: Self-pay | Admitting: Family Medicine

## 2022-06-14 DIAGNOSIS — E1165 Type 2 diabetes mellitus with hyperglycemia: Secondary | ICD-10-CM

## 2022-06-16 ENCOUNTER — Telehealth: Payer: Self-pay

## 2022-06-17 ENCOUNTER — Other Ambulatory Visit: Payer: Self-pay | Admitting: Family Medicine

## 2022-06-20 ENCOUNTER — Other Ambulatory Visit: Payer: Self-pay | Admitting: Family Medicine

## 2022-06-20 DIAGNOSIS — I10 Essential (primary) hypertension: Secondary | ICD-10-CM

## 2022-06-22 MED ORDER — PROPRANOLOL HCL ER 60 MG PO CP24: 60.0000 mg | ORAL_CAPSULE | Freq: Every day | ORAL | 1 refills | Status: DC

## 2022-06-22 MED ORDER — FREESTYLE LIBRE 2 SENSOR MISC: 0 refills | Status: DC

## 2022-06-23 ENCOUNTER — Other Ambulatory Visit: Payer: Self-pay

## 2022-06-23 DIAGNOSIS — E1165 Type 2 diabetes mellitus with hyperglycemia: Secondary | ICD-10-CM

## 2022-06-23 DIAGNOSIS — I10 Essential (primary) hypertension: Secondary | ICD-10-CM

## 2022-06-23 MED ORDER — PROPRANOLOL HCL ER 60 MG PO CP24: 60.0000 mg | ORAL_CAPSULE | Freq: Every day | ORAL | 1 refills | Status: DC

## 2022-06-23 MED ORDER — FREESTYLE LIBRE 2 SENSOR MISC: 0 refills | Status: DC

## 2022-07-17 NOTE — Telephone Encounter (Signed)
Error. ng 

## 2022-07-26 ENCOUNTER — Other Ambulatory Visit: Payer: Self-pay | Admitting: Family

## 2022-07-27 ENCOUNTER — Other Ambulatory Visit: Payer: Self-pay | Admitting: Internal Medicine

## 2022-08-02 ENCOUNTER — Encounter: Payer: Self-pay | Admitting: Family Medicine

## 2022-08-02 DIAGNOSIS — E782 Mixed hyperlipidemia: Secondary | ICD-10-CM

## 2022-08-03 MED ORDER — ATORVASTATIN CALCIUM 10 MG PO TABS: 10.0000 mg | ORAL_TABLET | Freq: Every day | ORAL | 3 refills | Status: DC

## 2022-09-01 ENCOUNTER — Other Ambulatory Visit: Payer: Self-pay | Admitting: Family Medicine

## 2022-09-01 DIAGNOSIS — E1165 Type 2 diabetes mellitus with hyperglycemia: Secondary | ICD-10-CM

## 2022-09-09 ENCOUNTER — Encounter: Payer: Self-pay | Admitting: Family Medicine

## 2022-09-09 DIAGNOSIS — E1165 Type 2 diabetes mellitus with hyperglycemia: Secondary | ICD-10-CM

## 2022-09-10 ENCOUNTER — Other Ambulatory Visit: Payer: Self-pay

## 2022-09-10 ENCOUNTER — Other Ambulatory Visit: Payer: Self-pay | Admitting: Family Medicine

## 2022-09-10 DIAGNOSIS — E1165 Type 2 diabetes mellitus with hyperglycemia: Secondary | ICD-10-CM

## 2022-09-10 MED ORDER — GLIMEPIRIDE 2 MG PO TABS: 2.0000 mg | ORAL_TABLET | Freq: Two times a day (BID) | ORAL | 0 refills | Status: DC

## 2022-09-10 MED ORDER — BASAGLAR KWIKPEN 100 UNIT/ML ~~LOC~~ SOPN: PEN_INJECTOR | SUBCUTANEOUS | 0 refills | Status: DC

## 2022-09-13 ENCOUNTER — Other Ambulatory Visit: Payer: Self-pay | Admitting: Family Medicine

## 2022-09-27 ENCOUNTER — Other Ambulatory Visit: Payer: Self-pay | Admitting: Family Medicine

## 2022-10-04 ENCOUNTER — Other Ambulatory Visit: Payer: Self-pay | Admitting: Family Medicine

## 2022-10-04 DIAGNOSIS — I10 Essential (primary) hypertension: Secondary | ICD-10-CM

## 2022-10-30 ENCOUNTER — Other Ambulatory Visit: Payer: Self-pay | Admitting: Family Medicine

## 2022-11-01 DIAGNOSIS — M5416 Radiculopathy, lumbar region: Secondary | ICD-10-CM | POA: Diagnosis not present

## 2022-11-07 ENCOUNTER — Other Ambulatory Visit: Payer: Self-pay | Admitting: Family Medicine

## 2022-11-07 DIAGNOSIS — I10 Essential (primary) hypertension: Secondary | ICD-10-CM

## 2022-11-09 DIAGNOSIS — M5416 Radiculopathy, lumbar region: Secondary | ICD-10-CM | POA: Diagnosis not present

## 2022-11-16 ENCOUNTER — Ambulatory Visit (INDEPENDENT_AMBULATORY_CARE_PROVIDER_SITE_OTHER): Payer: BC Managed Care – PPO | Admitting: Family Medicine

## 2022-11-16 ENCOUNTER — Encounter: Payer: Self-pay | Admitting: Family Medicine

## 2022-11-16 VITALS — BP 124/74 | HR 65 | Temp 97.7°F | Ht 70.0 in | Wt 183.8 lb

## 2022-11-16 DIAGNOSIS — G44209 Tension-type headache, unspecified, not intractable: Secondary | ICD-10-CM

## 2022-11-16 DIAGNOSIS — M5416 Radiculopathy, lumbar region: Secondary | ICD-10-CM | POA: Diagnosis not present

## 2022-11-16 DIAGNOSIS — E1165 Type 2 diabetes mellitus with hyperglycemia: Secondary | ICD-10-CM

## 2022-11-16 DIAGNOSIS — R519 Headache, unspecified: Secondary | ICD-10-CM | POA: Insufficient documentation

## 2022-11-16 DIAGNOSIS — G629 Polyneuropathy, unspecified: Secondary | ICD-10-CM

## 2022-11-16 DIAGNOSIS — E559 Vitamin D deficiency, unspecified: Secondary | ICD-10-CM

## 2022-11-16 DIAGNOSIS — I10 Essential (primary) hypertension: Secondary | ICD-10-CM

## 2022-11-16 DIAGNOSIS — K649 Unspecified hemorrhoids: Secondary | ICD-10-CM

## 2022-11-16 DIAGNOSIS — E538 Deficiency of other specified B group vitamins: Secondary | ICD-10-CM

## 2022-11-16 DIAGNOSIS — K625 Hemorrhage of anus and rectum: Secondary | ICD-10-CM

## 2022-11-16 LAB — VITAMIN B12: Vitamin B-12: 275 pg/mL (ref 211–911)

## 2022-11-16 LAB — MICROALBUMIN / CREATININE URINE RATIO
Creatinine,U: 191.4 mg/dL
Microalb Creat Ratio: 1.6 mg/g (ref 0.0–30.0)
Microalb, Ur: 3.1 mg/dL — ABNORMAL HIGH (ref 0.0–1.9)

## 2022-11-16 LAB — BASIC METABOLIC PANEL
BUN: 23 mg/dL (ref 6–23)
CO2: 26 mEq/L (ref 19–32)
Calcium: 9.3 mg/dL (ref 8.4–10.5)
Chloride: 104 mEq/L (ref 96–112)
Creatinine, Ser: 1.31 mg/dL (ref 0.40–1.50)
GFR: 57.81 mL/min — ABNORMAL LOW (ref >60.00)
Glucose, Bld: 109 mg/dL — ABNORMAL HIGH (ref 70–99)
Potassium: 3.8 mEq/L (ref 3.5–5.1)
Sodium: 138 mEq/L (ref 135–145)

## 2022-11-16 LAB — VITAMIN D 25 HYDROXY (VIT D DEFICIENCY, FRACTURES): VITD: 32.98 ng/mL (ref 30.00–100.00)

## 2022-11-16 LAB — HEMOGLOBIN A1C: Hgb A1c MFr Bld: 10.6 % — ABNORMAL HIGH (ref 4.6–6.5)

## 2022-11-16 MED ORDER — GABAPENTIN 300 MG PO CAPS: ORAL_CAPSULE | ORAL | 1 refills | Status: DC

## 2022-11-16 NOTE — Assessment & Plan Note (Signed)
Chronic issue.  Check A1c and urine microalbumin creatinine ratio.  Patient will continue Basaglar 52 units daily, glimepiride 2 mg twice daily, and metformin XR 2000 mg daily.

## 2022-11-16 NOTE — Assessment & Plan Note (Signed)
We are increasing his gabapentin as outlined in the radiculopathy problem.

## 2022-11-16 NOTE — Assessment & Plan Note (Signed)
Patient with radicular symptoms likely related to nerve impingement in his back.  He has already had adequate treatment with prednisone.  Will increase his gabapentin to 600 mg in the morning and have him continue 900 mg at night.  If he gets drowsy with this he will let us know.  If this is not beneficial he will let me know.

## 2022-11-16 NOTE — Assessment & Plan Note (Addendum)
Chronic issue.  Adequately controlled.  He will continue amlodipine 5 mg daily, propranolol 60 mg daily, and lisinopril 40 mg daily.

## 2022-11-16 NOTE — Progress Notes (Signed)
Jonathan Rumps, MD Phone: [phone]  Jonathan West is a 62 y.o. male who presents today for follow-up.  HYPERTENSION Disease Monitoring Home BP Monitoring not checking Chest pain- no    Dyspnea- no Medications Compliance-  taking amlodipine, propranolol, lisinopril.  Edema- no BMET    Component Value Date/Time   NA 139 01/12/2022 1600   K 4.1 01/12/2022 1600   CL 105 01/12/2022 1600   CO2 23 01/12/2022 1600   GLUCOSE 111 (H) 01/12/2022 1600   BUN 20 01/12/2022 1600   CREATININE 1.41 01/12/2022 1600   CREATININE 1.17 06/28/2020 1515   CALCIUM 9.5 01/12/2022 1600   GFRNONAA >60 08/15/2015 0856   GFRAA >60 08/15/2015 0856   DIABETES Disease Monitoring: Blood Sugar ranges-120-250, typically up to around 200 Polyuria/phagia/dipsia- no      Optho- due  Medications: Compliance- taking glimeperide, basaglar 52 u daily, metformin Hypoglycemic symptoms- no  Headache: Patient notes a very mild headache a couple weeks ago that resolved with 2 Tylenol.  It has not recurred.  He does report a history of migraine headaches in the past though this was not a migraine.  He has not had a migraine in about 10 years.  He notes no numbness or weakness.  No vision changes.  He wonders about having Fioricet on hand in case he gets a headache.  Back pain: Patient notes couple weeks ago he started to have some back discomfort that progressively worsened and then developed left-sided sciatica symptoms.  [facility] went to [facility] and was given a prednisone taper and Robaxin.  He started to feel better after a few days.  His back feels better at this time though he does continue to have some discomfort in his left leg along the outside with occasional shooting pain down his left leg.  Hemorrhoids: Patient notes that about 8 months ago these started to bother him.  He had surgery for hemorrhoids about 17 years ago.  He had a little blood in his stool last night though had not had any previously.  He  typically spends 30 to 60 minutes on the toilet in the evening as he feels as though he has to have a bowel movement though only has small bowel movements and sometimes does not have any bowel movements despite feeling the need to have a bowel movement.  He denies any constipation or having to strain for bowel movements.   Social History   Tobacco Use  Smoking Status Never  Smokeless Tobacco Never    Current Outpatient Medications on File Prior to Visit  Medication Sig Dispense Refill   amLODipine (NORVASC) 5 MG tablet TAKE 1 TABLET BY MOUTH ONCE DAILY. PATIENT NEEDS APPOINTMENT 30 tablet 0   aspirin EC 81 MG tablet Take 81 mg by mouth daily.     atorvastatin (LIPITOR) 10 MG tablet Take 1 tablet (10 mg total) by mouth daily. 90 tablet 3   Continuous Blood Gluc Sensor (FREESTYLE LIBRE 2 SENSOR) MISC USE AS DIRECTED THREE TIMES DAILY TO  CHECK  SUGARS  CHANGE  OUT  EVERY  14  DAYS 6 each 0   glimepiride (AMARYL) 2 MG tablet Take 1 tablet (2 mg total) by mouth 2 (two) times daily. 180 tablet 0   Insulin Glargine (BASAGLAR KWIKPEN) 100 UNIT/ML INJECT 52 UNITS SUBCUTANEOUSLY ONCE DAILY (TITRATE  AS  TOLERATED.  MAX  DAILY  DOSE  OF  60  UNITS) 60 mL 0   lisinopril (ZESTRIL) 20 MG tablet Take 2 tablets  by mouth once daily 180 tablet 0   metFORMIN (GLUCOPHAGE-XR) 500 MG 24 hr tablet TAKE 4 TABLETS BY MOUTH ONCE DAILY WITH BREAKFAST . APPOINTMENT REQUIRED FOR FUTURE REFILLS 360 tablet 0   propranolol ER (INDERAL LA) 60 MG 24 hr capsule Take 1 capsule (60 mg total) by mouth daily. 90 capsule 1   RELION PEN NEEDLES 32G X 4 MM MISC AS DIRECTED WITH  INSULIN  DAILY 100 each 0   Vitamin D, Ergocalciferol, 2000 units CAPS Take by mouth daily.     No current facility-administered medications on file prior to visit.     ROS see history of present illness  Objective  Physical Exam Vitals:   11/16/22 0843 11/16/22 0901  BP: 126/82 124/74  Pulse: 65   Temp: 97.7 F (36.5 C)   SpO2: 98%     BP  Readings from Last 3 Encounters:  11/16/22 124/74  01/09/22 120/60  01/29/21 120/80   Wt Readings from Last 3 Encounters:  11/16/22 183 lb 12.8 oz (83.4 kg)  04/17/22 183 lb (83 kg)  01/09/22 191 lb 3.2 oz (86.7 kg)    Physical Exam Constitutional:      General: He is not in acute distress.    Appearance: He is not diaphoretic.  Cardiovascular:     Rate and Rhythm: Normal rate and regular rhythm.     Heart sounds: Normal heart sounds.  Pulmonary:     Effort: Pulmonary effort is normal.     Breath sounds: Normal breath sounds.  Skin:    General: Skin is warm and dry.  Neurological:     Mental Status: He is alert.     Comments: CN 3-12 intact, 5/5 strength in bilateral biceps, triceps, grip, quads, hamstrings, plantar and dorsiflexion, sensation to light touch intact in bilateral UE and LE, normal gait    Diabetic Foot Exam - Simple   Simple Foot Form Diabetic Foot exam was performed with the following findings: Yes 11/16/2022  8:45 AM  Visual Inspection See comments: Yes Sensation Testing Intact to touch and monofilament testing bilaterally: Yes Pulse Check Posterior Tibialis and Dorsalis pulse intact bilaterally: Yes Comments Patient is missing both bilateral great toenails, onychomycosis in all of his toenails on his left foot, onychomycosis in his toenail on his right little toe, otherwise no deformities, ulcerations, or skin breakdown       Assessment/Plan: Please see individual problem list.  Uncontrolled type 2 diabetes mellitus with hyperglycemia ([place]) Assessment & Plan: Chronic issue.  Check A1c and urine microalbumin creatinine ratio.  Patient will continue Basaglar 52 units daily, glimepiride 2 mg twice daily, and metformin XR 2000 mg daily.  Orders: -     Hemoglobin A1c -     Microalbumin / creatinine urine ratio  Essential hypertension Assessment & Plan: Chronic issue.  Adequately controlled.  He will continue amlodipine 5 mg daily, propranolol 60 mg  daily, and lisinopril 40 mg daily.  Orders: -     Basic metabolic panel  Lumbar radiculopathy Assessment & Plan: Patient with radicular symptoms likely related to nerve impingement in his back.  He has already had adequate treatment with prednisone.  Will increase his gabapentin to 600 mg in the morning and have him continue 900 mg at night.  If he gets drowsy with this he will let us know.  If this is not beneficial he will let me know.  Orders: -     Gabapentin; Take 2 capsules (600 mg total) by mouth in the  morning AND 3 capsules (900 mg total) at bedtime.  Dispense: 450 capsule; Refill: 1  Neuropathy Assessment & Plan: We are increasing his gabapentin as outlined in the radiculopathy problem.  Orders: -     Gabapentin; Take 2 capsules (600 mg total) by mouth in the morning AND 3 capsules (900 mg total) at bedtime.  Dispense: 450 capsule; Refill: 1  B12 deficiency Assessment & Plan: Low in the past.  Recheck today.  Patient is on metformin which places him at risk for low B12.  Orders: -     Vitamin B12  Vitamin D deficiency Assessment & Plan: Low in the past.  Recheck today.  Orders: -     VITAMIN D 25 Hydroxy (Vit-D Deficiency, Fractures)  Hemorrhoids, unspecified hemorrhoid type -     Ambulatory referral to Gastroenterology  BRBPR (bright red blood per rectum) Assessment & Plan: Likely related to hemorrhoids.  Will refer back to GI.  Orders: -     Ambulatory referral to Gastroenterology  Acute non intractable tension-type headache Assessment & Plan: Very mild headache several weeks ago.  Discussed I would not recommend Fioricet unless he was having a migraine.  At this time he will monitor for recurrence of headache and if they recur frequently he will let us know.  He can use Tylenol over-the-counter for any headaches at this time.     Return in about 3 months (around 02/16/2023) for Diabetes.  I have spent 41 minutes in the care of this patient regarding  history taking, documentation, completion of exam, discussion of plan, review of problem list, placing orders.   Jonathan Rumps, MD [place]

## 2022-11-16 NOTE — Assessment & Plan Note (Signed)
Low in the past.  Recheck today.

## 2022-11-16 NOTE — Assessment & Plan Note (Signed)
Likely related to hemorrhoids.  Will refer back to GI.

## 2022-11-16 NOTE — Assessment & Plan Note (Signed)
Low in the past.  Recheck today.  Patient is on metformin which places him at risk for low B12.

## 2022-11-16 NOTE — Patient Instructions (Signed)
Nice to see you. We will check labs today and contact you with the results.  Please increase your gabapentin to 600 mg in the morning and continue 900 mg at night.  If this makes you excessively drowsy please let us know.  If this is not helpful please let us know. Please monitor your headaches and if they recur please let us know.

## 2022-11-16 NOTE — Assessment & Plan Note (Signed)
Very mild headache several weeks ago.  Discussed I would not recommend Fioricet unless he was having a migraine.  At this time he will monitor for recurrence of headache and if they recur frequently he will let us know.  He can use Tylenol over-the-counter for any headaches at this time.

## 2022-11-18 ENCOUNTER — Other Ambulatory Visit: Payer: Self-pay | Admitting: Family Medicine

## 2022-11-18 DIAGNOSIS — E559 Vitamin D deficiency, unspecified: Secondary | ICD-10-CM

## 2022-11-18 DIAGNOSIS — E1165 Type 2 diabetes mellitus with hyperglycemia: Secondary | ICD-10-CM

## 2022-11-18 DIAGNOSIS — E538 Deficiency of other specified B group vitamins: Secondary | ICD-10-CM

## 2022-11-29 ENCOUNTER — Other Ambulatory Visit: Payer: Self-pay | Admitting: Family Medicine

## 2022-11-29 DIAGNOSIS — E1165 Type 2 diabetes mellitus with hyperglycemia: Secondary | ICD-10-CM

## 2022-12-06 ENCOUNTER — Other Ambulatory Visit: Payer: Self-pay | Admitting: Family Medicine

## 2022-12-06 DIAGNOSIS — E1165 Type 2 diabetes mellitus with hyperglycemia: Secondary | ICD-10-CM

## 2022-12-08 ENCOUNTER — Other Ambulatory Visit: Payer: Self-pay | Admitting: Family Medicine

## 2022-12-08 DIAGNOSIS — I10 Essential (primary) hypertension: Secondary | ICD-10-CM

## 2022-12-09 ENCOUNTER — Other Ambulatory Visit: Payer: Self-pay | Admitting: Family Medicine

## 2022-12-09 DIAGNOSIS — E1165 Type 2 diabetes mellitus with hyperglycemia: Secondary | ICD-10-CM

## 2022-12-13 ENCOUNTER — Other Ambulatory Visit: Payer: Self-pay | Admitting: Family Medicine

## 2022-12-13 DIAGNOSIS — I10 Essential (primary) hypertension: Secondary | ICD-10-CM

## 2022-12-26 ENCOUNTER — Other Ambulatory Visit: Payer: Self-pay | Admitting: Family Medicine

## 2023-01-04 DIAGNOSIS — K921 Melena: Secondary | ICD-10-CM | POA: Diagnosis not present

## 2023-01-04 DIAGNOSIS — K5904 Chronic idiopathic constipation: Secondary | ICD-10-CM | POA: Diagnosis not present

## 2023-01-04 DIAGNOSIS — E119 Type 2 diabetes mellitus without complications: Secondary | ICD-10-CM | POA: Diagnosis not present

## 2023-01-04 DIAGNOSIS — I10 Essential (primary) hypertension: Secondary | ICD-10-CM | POA: Diagnosis not present

## 2023-01-26 DIAGNOSIS — M5442 Lumbago with sciatica, left side: Secondary | ICD-10-CM | POA: Diagnosis not present

## 2023-01-26 DIAGNOSIS — M9903 Segmental and somatic dysfunction of lumbar region: Secondary | ICD-10-CM | POA: Diagnosis not present

## 2023-01-26 DIAGNOSIS — M25552 Pain in left hip: Secondary | ICD-10-CM | POA: Diagnosis not present

## 2023-01-26 DIAGNOSIS — M7918 Myalgia, other site: Secondary | ICD-10-CM | POA: Diagnosis not present

## 2023-01-26 DIAGNOSIS — M9904 Segmental and somatic dysfunction of sacral region: Secondary | ICD-10-CM | POA: Diagnosis not present

## 2023-01-27 DIAGNOSIS — M9904 Segmental and somatic dysfunction of sacral region: Secondary | ICD-10-CM | POA: Diagnosis not present

## 2023-01-27 DIAGNOSIS — M5442 Lumbago with sciatica, left side: Secondary | ICD-10-CM | POA: Diagnosis not present

## 2023-01-27 DIAGNOSIS — M7918 Myalgia, other site: Secondary | ICD-10-CM | POA: Diagnosis not present

## 2023-01-27 DIAGNOSIS — M25552 Pain in left hip: Secondary | ICD-10-CM | POA: Diagnosis not present

## 2023-01-27 DIAGNOSIS — M9903 Segmental and somatic dysfunction of lumbar region: Secondary | ICD-10-CM | POA: Diagnosis not present

## 2023-01-28 DIAGNOSIS — M5442 Lumbago with sciatica, left side: Secondary | ICD-10-CM | POA: Diagnosis not present

## 2023-01-28 DIAGNOSIS — M25552 Pain in left hip: Secondary | ICD-10-CM | POA: Diagnosis not present

## 2023-01-28 DIAGNOSIS — M7918 Myalgia, other site: Secondary | ICD-10-CM | POA: Diagnosis not present

## 2023-01-28 DIAGNOSIS — M9903 Segmental and somatic dysfunction of lumbar region: Secondary | ICD-10-CM | POA: Diagnosis not present

## 2023-01-28 DIAGNOSIS — M9904 Segmental and somatic dysfunction of sacral region: Secondary | ICD-10-CM | POA: Diagnosis not present

## 2023-01-31 ENCOUNTER — Other Ambulatory Visit: Payer: Self-pay | Admitting: Family Medicine

## 2023-02-02 DIAGNOSIS — M9904 Segmental and somatic dysfunction of sacral region: Secondary | ICD-10-CM | POA: Diagnosis not present

## 2023-02-02 DIAGNOSIS — M9903 Segmental and somatic dysfunction of lumbar region: Secondary | ICD-10-CM | POA: Diagnosis not present

## 2023-02-02 DIAGNOSIS — M7918 Myalgia, other site: Secondary | ICD-10-CM | POA: Diagnosis not present

## 2023-02-02 DIAGNOSIS — M25552 Pain in left hip: Secondary | ICD-10-CM | POA: Diagnosis not present

## 2023-02-02 DIAGNOSIS — M5442 Lumbago with sciatica, left side: Secondary | ICD-10-CM | POA: Diagnosis not present

## 2023-02-03 DIAGNOSIS — M9904 Segmental and somatic dysfunction of sacral region: Secondary | ICD-10-CM | POA: Diagnosis not present

## 2023-02-03 DIAGNOSIS — M7918 Myalgia, other site: Secondary | ICD-10-CM | POA: Diagnosis not present

## 2023-02-03 DIAGNOSIS — M25552 Pain in left hip: Secondary | ICD-10-CM | POA: Diagnosis not present

## 2023-02-03 DIAGNOSIS — M9903 Segmental and somatic dysfunction of lumbar region: Secondary | ICD-10-CM | POA: Diagnosis not present

## 2023-02-03 DIAGNOSIS — M5442 Lumbago with sciatica, left side: Secondary | ICD-10-CM | POA: Diagnosis not present

## 2023-02-04 DIAGNOSIS — M5442 Lumbago with sciatica, left side: Secondary | ICD-10-CM | POA: Diagnosis not present

## 2023-02-04 DIAGNOSIS — M7918 Myalgia, other site: Secondary | ICD-10-CM | POA: Diagnosis not present

## 2023-02-04 DIAGNOSIS — M9903 Segmental and somatic dysfunction of lumbar region: Secondary | ICD-10-CM | POA: Diagnosis not present

## 2023-02-04 DIAGNOSIS — M25552 Pain in left hip: Secondary | ICD-10-CM | POA: Diagnosis not present

## 2023-02-04 DIAGNOSIS — M9904 Segmental and somatic dysfunction of sacral region: Secondary | ICD-10-CM | POA: Diagnosis not present

## 2023-02-05 DIAGNOSIS — M25552 Pain in left hip: Secondary | ICD-10-CM | POA: Diagnosis not present

## 2023-02-05 DIAGNOSIS — M9903 Segmental and somatic dysfunction of lumbar region: Secondary | ICD-10-CM | POA: Diagnosis not present

## 2023-02-05 DIAGNOSIS — M7918 Myalgia, other site: Secondary | ICD-10-CM | POA: Diagnosis not present

## 2023-02-05 DIAGNOSIS — M5442 Lumbago with sciatica, left side: Secondary | ICD-10-CM | POA: Diagnosis not present

## 2023-02-05 DIAGNOSIS — M9904 Segmental and somatic dysfunction of sacral region: Secondary | ICD-10-CM | POA: Diagnosis not present

## 2023-02-08 DIAGNOSIS — M25552 Pain in left hip: Secondary | ICD-10-CM | POA: Diagnosis not present

## 2023-02-08 DIAGNOSIS — M7918 Myalgia, other site: Secondary | ICD-10-CM | POA: Diagnosis not present

## 2023-02-08 DIAGNOSIS — M5442 Lumbago with sciatica, left side: Secondary | ICD-10-CM | POA: Diagnosis not present

## 2023-02-08 DIAGNOSIS — M9903 Segmental and somatic dysfunction of lumbar region: Secondary | ICD-10-CM | POA: Diagnosis not present

## 2023-02-08 DIAGNOSIS — M9904 Segmental and somatic dysfunction of sacral region: Secondary | ICD-10-CM | POA: Diagnosis not present

## 2023-02-10 DIAGNOSIS — M9903 Segmental and somatic dysfunction of lumbar region: Secondary | ICD-10-CM | POA: Diagnosis not present

## 2023-02-10 DIAGNOSIS — M9904 Segmental and somatic dysfunction of sacral region: Secondary | ICD-10-CM | POA: Diagnosis not present

## 2023-02-10 DIAGNOSIS — M7918 Myalgia, other site: Secondary | ICD-10-CM | POA: Diagnosis not present

## 2023-02-10 DIAGNOSIS — M25552 Pain in left hip: Secondary | ICD-10-CM | POA: Diagnosis not present

## 2023-02-10 DIAGNOSIS — M5442 Lumbago with sciatica, left side: Secondary | ICD-10-CM | POA: Diagnosis not present

## 2023-02-11 DIAGNOSIS — M7918 Myalgia, other site: Secondary | ICD-10-CM | POA: Diagnosis not present

## 2023-02-11 DIAGNOSIS — M9903 Segmental and somatic dysfunction of lumbar region: Secondary | ICD-10-CM | POA: Diagnosis not present

## 2023-02-11 DIAGNOSIS — M5442 Lumbago with sciatica, left side: Secondary | ICD-10-CM | POA: Diagnosis not present

## 2023-02-11 DIAGNOSIS — M25552 Pain in left hip: Secondary | ICD-10-CM | POA: Diagnosis not present

## 2023-02-11 DIAGNOSIS — M9904 Segmental and somatic dysfunction of sacral region: Secondary | ICD-10-CM | POA: Diagnosis not present

## 2023-02-12 ENCOUNTER — Ambulatory Visit: Payer: BC Managed Care – PPO

## 2023-02-12 DIAGNOSIS — K921 Melena: Secondary | ICD-10-CM | POA: Diagnosis not present

## 2023-02-12 DIAGNOSIS — K5904 Chronic idiopathic constipation: Secondary | ICD-10-CM | POA: Diagnosis not present

## 2023-02-12 DIAGNOSIS — K640 First degree hemorrhoids: Secondary | ICD-10-CM | POA: Diagnosis not present

## 2023-02-12 DIAGNOSIS — K5730 Diverticulosis of large intestine without perforation or abscess without bleeding: Secondary | ICD-10-CM | POA: Diagnosis not present

## 2023-02-12 DIAGNOSIS — K6289 Other specified diseases of anus and rectum: Secondary | ICD-10-CM | POA: Diagnosis not present

## 2023-02-15 DIAGNOSIS — M7918 Myalgia, other site: Secondary | ICD-10-CM | POA: Diagnosis not present

## 2023-02-15 DIAGNOSIS — M9904 Segmental and somatic dysfunction of sacral region: Secondary | ICD-10-CM | POA: Diagnosis not present

## 2023-02-15 DIAGNOSIS — M25552 Pain in left hip: Secondary | ICD-10-CM | POA: Diagnosis not present

## 2023-02-15 DIAGNOSIS — M5442 Lumbago with sciatica, left side: Secondary | ICD-10-CM | POA: Diagnosis not present

## 2023-02-15 DIAGNOSIS — M9903 Segmental and somatic dysfunction of lumbar region: Secondary | ICD-10-CM | POA: Diagnosis not present

## 2023-02-16 ENCOUNTER — Encounter: Payer: Self-pay | Admitting: Family Medicine

## 2023-02-16 DIAGNOSIS — M9903 Segmental and somatic dysfunction of lumbar region: Secondary | ICD-10-CM | POA: Diagnosis not present

## 2023-02-16 DIAGNOSIS — M7918 Myalgia, other site: Secondary | ICD-10-CM | POA: Diagnosis not present

## 2023-02-16 DIAGNOSIS — M5442 Lumbago with sciatica, left side: Secondary | ICD-10-CM | POA: Diagnosis not present

## 2023-02-16 DIAGNOSIS — M25552 Pain in left hip: Secondary | ICD-10-CM | POA: Diagnosis not present

## 2023-02-16 DIAGNOSIS — M9904 Segmental and somatic dysfunction of sacral region: Secondary | ICD-10-CM | POA: Diagnosis not present

## 2023-02-17 ENCOUNTER — Other Ambulatory Visit: Payer: BC Managed Care – PPO

## 2023-02-17 ENCOUNTER — Ambulatory Visit: Payer: BC Managed Care – PPO | Admitting: Family Medicine

## 2023-02-18 DIAGNOSIS — M7918 Myalgia, other site: Secondary | ICD-10-CM | POA: Diagnosis not present

## 2023-02-18 DIAGNOSIS — M9903 Segmental and somatic dysfunction of lumbar region: Secondary | ICD-10-CM | POA: Diagnosis not present

## 2023-02-18 DIAGNOSIS — M9904 Segmental and somatic dysfunction of sacral region: Secondary | ICD-10-CM | POA: Diagnosis not present

## 2023-02-18 DIAGNOSIS — M5442 Lumbago with sciatica, left side: Secondary | ICD-10-CM | POA: Diagnosis not present

## 2023-02-18 DIAGNOSIS — M25552 Pain in left hip: Secondary | ICD-10-CM | POA: Diagnosis not present

## 2023-02-19 ENCOUNTER — Other Ambulatory Visit: Payer: BC Managed Care – PPO

## 2023-02-19 NOTE — Addendum Note (Signed)
Addended by: Lynzy Rawles S on: 02/19/2023 01:54 PM   Modules accepted: Orders  

## 2023-02-24 DIAGNOSIS — M47896 Other spondylosis, lumbar region: Secondary | ICD-10-CM | POA: Diagnosis not present

## 2023-02-24 DIAGNOSIS — M5416 Radiculopathy, lumbar region: Secondary | ICD-10-CM | POA: Diagnosis not present

## 2023-02-24 DIAGNOSIS — Z9889 Other specified postprocedural states: Secondary | ICD-10-CM | POA: Diagnosis not present

## 2023-02-25 DIAGNOSIS — M5432 Sciatica, left side: Secondary | ICD-10-CM | POA: Diagnosis not present

## 2023-03-08 ENCOUNTER — Other Ambulatory Visit (INDEPENDENT_AMBULATORY_CARE_PROVIDER_SITE_OTHER): Payer: BC Managed Care – PPO

## 2023-03-08 DIAGNOSIS — E538 Deficiency of other specified B group vitamins: Secondary | ICD-10-CM

## 2023-03-08 DIAGNOSIS — E559 Vitamin D deficiency, unspecified: Secondary | ICD-10-CM | POA: Diagnosis not present

## 2023-03-09 LAB — VITAMIN B12: Vitamin B-12: 510 pg/mL (ref 200–1100)

## 2023-03-09 LAB — VITAMIN D 25 HYDROXY (VIT D DEFICIENCY, FRACTURES): Vit D, 25-Hydroxy: 54 ng/mL (ref 30–100)

## 2023-03-10 ENCOUNTER — Ambulatory Visit (INDEPENDENT_AMBULATORY_CARE_PROVIDER_SITE_OTHER): Payer: BC Managed Care – PPO | Admitting: Family Medicine

## 2023-03-10 ENCOUNTER — Encounter: Payer: Self-pay | Admitting: Family Medicine

## 2023-03-10 VITALS — BP 126/76 | HR 70 | Temp 97.7°F | Ht 70.0 in | Wt 182.6 lb

## 2023-03-10 DIAGNOSIS — M5416 Radiculopathy, lumbar region: Secondary | ICD-10-CM | POA: Diagnosis not present

## 2023-03-10 DIAGNOSIS — E559 Vitamin D deficiency, unspecified: Secondary | ICD-10-CM

## 2023-03-10 DIAGNOSIS — Z7984 Long term (current) use of oral hypoglycemic drugs: Secondary | ICD-10-CM | POA: Diagnosis not present

## 2023-03-10 DIAGNOSIS — M5432 Sciatica, left side: Secondary | ICD-10-CM | POA: Diagnosis not present

## 2023-03-10 DIAGNOSIS — E1165 Type 2 diabetes mellitus with hyperglycemia: Secondary | ICD-10-CM

## 2023-03-10 DIAGNOSIS — E538 Deficiency of other specified B group vitamins: Secondary | ICD-10-CM

## 2023-03-10 LAB — POCT GLYCOSYLATED HEMOGLOBIN (HGB A1C): Hemoglobin A1C: 7.9 % — AB (ref 4.0–5.6)

## 2023-03-10 NOTE — Assessment & Plan Note (Signed)
Chronic issue.  Improved with supplementation.  He will continue vitamin D over-the-counter 2000 international units daily.

## 2023-03-10 NOTE — Progress Notes (Signed)
Marikay Alar, MD Phone: [phone]  Jonathan West is a 62 y.o. male who presents today for f/u.  DIABETES Disease Monitoring: Blood Sugar ranges-140-190s Polyuria/phagia/dipsia- no      Optho- due Medications: Compliance- taking glimeperide, metformin, basaglar 57 u daily Hypoglycemic symptoms- no Insurance has been the limiting factor in getting on newer DM medications.   Sciatica: Patient has seen a back specialist for this.  He has an MRI scheduled for tonight.  He follows up with a specialist next week.  Vitamin B12 and vitamin D levels have improved with over-the-counter supplementation.   Social History   Tobacco Use  Smoking Status Never  Smokeless Tobacco Never    Current Outpatient Medications on File Prior to Visit  Medication Sig Dispense Refill   amLODipine (NORVASC) 5 MG tablet TAKE 1 TABLET BY MOUTH ONCE DAILY (NEEDS  APPOINTMENT) 90 tablet 0   aspirin EC 81 MG tablet Take 81 mg by mouth daily.     atorvastatin (LIPITOR) 10 MG tablet Take 1 tablet (10 mg total) by mouth daily. 90 tablet 3   Continuous Blood Gluc Sensor (FREESTYLE LIBRE 2 SENSOR) MISC USE AS DIRECTED THREE TIMES DAILY TO  CHECK  SUGARS  CHANGE  OUT  EVERY  14  DAYS 6 each 0   gabapentin (NEURONTIN) 300 MG capsule Take 2 capsules (600 mg total) by mouth in the morning AND 3 capsules (900 mg total) at bedtime. 450 capsule 1   glimepiride (AMARYL) 2 MG tablet Take 1 tablet by mouth twice daily 180 tablet 3   Insulin Glargine (BASAGLAR KWIKPEN) 100 UNIT/ML INJECT 52 UNITS SUBCUTANEOUSLY ONCE DAILY. TITRATE AS TOLERATED. MAX DAILY DOSE OF 60 UNITS 60 mL 0   lisinopril (ZESTRIL) 20 MG tablet Take 2 tablets by mouth once daily 180 tablet 0   metFORMIN (GLUCOPHAGE-XR) 500 MG 24 hr tablet TAKE 4 TABLETS BY MOUTH ONCE DAILY WITH BREAKFAST (APPOINTMENT  REQUIRED  FOR  FUTURE  REFILLS) 360 tablet 0   propranolol ER (INDERAL LA) 60 MG 24 hr capsule Take 1 capsule (60 mg total) by mouth daily. 90 capsule 1    RELION PEN NEEDLES 32G X 4 MM MISC USE AS DIRECTED DAILY WITH INSULIN 100 each 0   Vitamin D, Ergocalciferol, 2000 units CAPS Take by mouth daily.     No current facility-administered medications on file prior to visit.     ROS see history of present illness  Objective  Physical Exam Vitals:   03/10/23 1609  BP: 126/76  Pulse: 70  Temp: 97.7 F (36.5 C)  SpO2: 99%    BP Readings from Last 3 Encounters:  03/10/23 126/76  11/16/22 124/74  01/09/22 120/60   Wt Readings from Last 3 Encounters:  03/10/23 182 lb 9.6 oz (82.8 kg)  11/16/22 183 lb 12.8 oz (83.4 kg)  04/17/22 183 lb (83 kg)    Physical Exam Constitutional:      General: He is not in acute distress.    Appearance: He is not diaphoretic.  Cardiovascular:     Rate and Rhythm: Normal rate and regular rhythm.     Heart sounds: Normal heart sounds.  Pulmonary:     Effort: Pulmonary effort is normal.     Breath sounds: Normal breath sounds.  Skin:    General: Skin is warm and dry.  Neurological:     Mental Status: He is alert.      Assessment/Plan: Please see individual problem list.  Uncontrolled type 2 diabetes mellitus with hyperglycemia (  [facility]) Assessment & Plan: Chronic issue.  Has improved since her last visit though still not quite at goal.  I will reach out to our clinical pharmacist to get her input on possible insurance coverage for Jardiance or Ozempic.  In the past insurance has been the limiting factor in getting these medications.  He will continue Basaglar 57 units daily, metformin XR 2000 mg daily, and glimepiride 2 mg twice daily.  Orders: -     POCT glycosylated hemoglobin (Hb A1C)  Lumbar radiculopathy Assessment & Plan: Patient has seen a specialist for this.  He will have the MRI as planned and follow-up with the specialist.   B12 deficiency Assessment & Plan: Chronic issue.  Acceptable on recheck.  He will continue B12 1000 mcg once daily.   Vitamin D deficiency Assessment  & Plan: Chronic issue.  Improved with supplementation.  He will continue vitamin D over-the-counter 2000 international units daily.      Return in about 3 months (around 06/10/2023) for DM.   Marikay Alar, MD [facility]

## 2023-03-10 NOTE — Assessment & Plan Note (Signed)
Patient has seen a specialist for this.  He will have the MRI as planned and follow-up with the specialist.

## 2023-03-10 NOTE — Assessment & Plan Note (Signed)
Chronic issue.  Acceptable on recheck.  He will continue B12 1000 mcg once daily.

## 2023-03-10 NOTE — Assessment & Plan Note (Signed)
Chronic issue.  Has improved since her last visit though still not quite at goal.  I will reach out to our clinical pharmacist to get her input on possible insurance coverage for Jardiance or Ozempic.  In the past insurance has been the limiting factor in getting these medications.  He will continue Basaglar 57 units daily, metformin XR 2000 mg daily, and glimepiride 2 mg twice daily.

## 2023-03-12 DIAGNOSIS — M5126 Other intervertebral disc displacement, lumbar region: Secondary | ICD-10-CM | POA: Diagnosis not present

## 2023-03-12 DIAGNOSIS — M5127 Other intervertebral disc displacement, lumbosacral region: Secondary | ICD-10-CM | POA: Diagnosis not present

## 2023-03-19 DIAGNOSIS — M5126 Other intervertebral disc displacement, lumbar region: Secondary | ICD-10-CM | POA: Diagnosis not present

## 2023-03-24 ENCOUNTER — Other Ambulatory Visit: Payer: Self-pay | Admitting: Family Medicine

## 2023-03-29 ENCOUNTER — Other Ambulatory Visit (HOSPITAL_COMMUNITY): Payer: Self-pay

## 2023-03-29 ENCOUNTER — Telehealth: Payer: Self-pay

## 2023-03-29 ENCOUNTER — Telehealth: Payer: Self-pay | Admitting: Family Medicine

## 2023-03-29 NOTE — Telephone Encounter (Signed)
Please let the patient know that I heard back from the pharmacist after our last visit. She noted that we would have to just send in jardiance or ozempic to see if his insurance would provide coverage for those. It looks like the pharmacy team did run a test claim on the ozempic and jardiance and the cost is below.   Ran test claim for Ozempic 0.25 or 0.5mg /dose and the current 28 day co-pay is $949.37, due to deductible     Ran test claim for Jardiance 10 mg and the current 30 day co-pay is $599.39, due to deductible.  Does he want to proceed with either of these?

## 2023-03-29 NOTE — Telephone Encounter (Signed)
-----   Message from [employer] sent at 03/10/2023  5:47 PM EDT ----- Elvina Sidle -   Could you test claim Ozempic 0.25/0.5 3 mL for 28 day and Jardiance 10 mg 30 for 30 day to see what copays look like? Let me know if you need me to put in a PA.   THANKS! ----- Message ----- From: Glori Luis, MD Sent: 03/10/2023   4:25 PM EDT To: Alden Hipp, RPH-CPP  Hey, I believe you have seen this patient in the past. I would like for him to get on jardiance or ozempic though insurance has been an issue previously. Other than me sending the medications in is there any way to tell if coverage for these has changed for him? Thanks.   Minerva Areola

## 2023-03-29 NOTE — Telephone Encounter (Signed)
Pharmacy Patient Advocate Encounter  Insurance verification completed.    The patient is insured through [facility]   Ran test claim for Ozempic 0.25 or 0.5mg /dose and the current 28 day co-pay is $949.37, due to deductible   Ran test claim for Jardiance 10 mg and the current 30 day co-pay is $599.39, due to deductible.  This test claim was processed through [facility]- copay amounts may vary at other pharmacies due to pharmacy/plan contracts, or as the patient moves through the different stages of their insurance plan

## 2023-03-29 NOTE — Telephone Encounter (Signed)
Patient states that is steep for one month of medication and he can not do that.

## 2023-03-30 NOTE — Telephone Encounter (Signed)
See below prices. Copay cards would only take off ~ $150 per month, so either are likely still unaffordable on patient's current insurance plan.   Appears patient is currently on Basaglar 52 units daily (~0.6 units/kg daily), so I would not continue to increase basal insulin. Glimepiride is at max 4 mg daily and metformin is at max 2000 mg daily. Could consider switching glimepiride to mealtime insulin.   Does patient plan to pick up Medicare next year? That may significantly help alleviate medication access issues.   Catie

## 2023-03-31 MED ORDER — INSULIN LISPRO (1 UNIT DIAL) 100 UNIT/ML (KWIKPEN): 15.0000 [IU] | PEN_INJECTOR | Freq: Three times a day (TID) | SUBCUTANEOUS | 2 refills | Status: DC

## 2023-03-31 NOTE — Telephone Encounter (Signed)
Start Humalog 15 units up to three times daily with meals. Stop glimepiride, continue metformin and Basaglar 52 units daily. Patient instructed via MyChart. Waiting to hear back from him on preferred days of the week/times of the day for a follow up phone appointment in ~ 4 weeks.

## 2023-03-31 NOTE — Telephone Encounter (Signed)
Noted.  Patient is willing to start mealtime insulin.  Can you please guide me on how to best do this?  Thanks.

## 2023-03-31 NOTE — Addendum Note (Signed)
Addended by: Nilda Simmer T on: 03/31/2023 01:30 PM   Modules accepted: Orders

## 2023-03-31 NOTE — Telephone Encounter (Signed)
Please see other phone message  

## 2023-03-31 NOTE — Telephone Encounter (Signed)
Noted.  I will forward to my clinical pool to see if the patient is willing to switch glimepiride to mealtime insulin.  I do think this is the next best step in management and given the affordability issues with the Ozempic and Jardiance.  If he is willing to switch to mealtime insulin I will have Catie weigh in on the best way to do this.  Please also find out if he is going to start on Medicare next year.

## 2023-03-31 NOTE — Telephone Encounter (Signed)
Thanks

## 2023-03-31 NOTE — Telephone Encounter (Signed)
Patient is willing to switch Glimepiride to mealtime isulin. Patient has not looked into Medicare yet but will let us know when he does. [place] beach [place] so please send a my chart message with the mealtime insulin change. Patient states he will be back from the beach next week.

## 2023-04-05 DIAGNOSIS — M5416 Radiculopathy, lumbar region: Secondary | ICD-10-CM | POA: Diagnosis not present

## 2023-04-09 ENCOUNTER — Other Ambulatory Visit: Payer: Self-pay | Admitting: Family Medicine

## 2023-04-09 DIAGNOSIS — I10 Essential (primary) hypertension: Secondary | ICD-10-CM

## 2023-04-09 DIAGNOSIS — M5127 Other intervertebral disc displacement, lumbosacral region: Secondary | ICD-10-CM | POA: Diagnosis not present

## 2023-05-02 ENCOUNTER — Other Ambulatory Visit: Payer: Self-pay | Admitting: Family Medicine

## 2023-06-11 ENCOUNTER — Ambulatory Visit: Payer: BC Managed Care – PPO | Admitting: Family Medicine

## 2023-06-11 DIAGNOSIS — J069 Acute upper respiratory infection, unspecified: Secondary | ICD-10-CM | POA: Diagnosis not present

## 2023-06-11 DIAGNOSIS — Z20822 Contact with and (suspected) exposure to covid-19: Secondary | ICD-10-CM | POA: Diagnosis not present

## 2023-06-12 ENCOUNTER — Other Ambulatory Visit: Payer: Self-pay | Admitting: Family Medicine

## 2023-06-12 DIAGNOSIS — I10 Essential (primary) hypertension: Secondary | ICD-10-CM

## 2023-06-16 ENCOUNTER — Ambulatory Visit: Payer: BC Managed Care – PPO | Admitting: Family Medicine

## 2023-06-16 ENCOUNTER — Encounter: Payer: Self-pay | Admitting: Family Medicine

## 2023-06-16 VITALS — BP 122/78 | HR 73 | Temp 97.8°F | Ht 70.0 in | Wt 176.6 lb

## 2023-06-16 DIAGNOSIS — Z7984 Long term (current) use of oral hypoglycemic drugs: Secondary | ICD-10-CM

## 2023-06-16 DIAGNOSIS — Z794 Long term (current) use of insulin: Secondary | ICD-10-CM

## 2023-06-16 DIAGNOSIS — E782 Mixed hyperlipidemia: Secondary | ICD-10-CM

## 2023-06-16 DIAGNOSIS — I10 Essential (primary) hypertension: Secondary | ICD-10-CM | POA: Diagnosis not present

## 2023-06-16 DIAGNOSIS — M7022 Olecranon bursitis, left elbow: Secondary | ICD-10-CM | POA: Diagnosis not present

## 2023-06-16 DIAGNOSIS — J069 Acute upper respiratory infection, unspecified: Secondary | ICD-10-CM

## 2023-06-16 DIAGNOSIS — E1165 Type 2 diabetes mellitus with hyperglycemia: Secondary | ICD-10-CM | POA: Diagnosis not present

## 2023-06-16 MED ORDER — BASAGLAR KWIKPEN 100 UNIT/ML ~~LOC~~ SOPN
PEN_INJECTOR | SUBCUTANEOUS | 1 refills | Status: DC
Start: 2023-06-16 — End: 2023-09-24

## 2023-06-16 MED ORDER — FREESTYLE LIBRE 2 SENSOR MISC: 0 refills | Status: DC

## 2023-06-16 NOTE — Assessment & Plan Note (Signed)
Patient olecranon bursitis.  This does not appear to be infected at this time.  Refer to orthopedics to consider drainage.

## 2023-06-16 NOTE — Assessment & Plan Note (Signed)
Seems to have improved some.  Given drowsiness with promethazine I advised to discontinue this.  Tessalon has not been beneficial so we will stop that as well.  If he is not improving over the rest of the week he will let us know.

## 2023-06-16 NOTE — Progress Notes (Signed)
Marikay Alar, MD Phone: [phone]  Jonathan West is a 62 y.o. male who presents today for follow-up.  Diabetes: Patient notes his sugars have been more elevated recently.  He notes sugars range from 168 to unreadable on his meter.  He notes he has been off of his Basaglar for a couple of weeks as he thought he was supposed to come off of that and start on the Humalog.  He has been taking Humalog 15 units in the morning about an hour before he eats and then around 5 PM about an hour before dinner and then takes another dose around 11 PM.  He is also on metformin.  He does have polyuria.  He notes he seldom eats lunch.  He has an oatmeal cookie for breakfast.  He eats dinner around 630.  He notes he is working on getting into a new eye doctor.  Hypertension: Not checking blood pressures.  He is taking amlodipine and lisinopril.  No chest pain or shortness of breath.  Upper respiratory infection: He notes he went to urgent care for evaluation.  He reports he had a negative COVID test.  He states they gave him promethazine and Tessalon to help with cough.  Notes the promethazine is making him drowsy.  He notes his cough has improved some.  Olecranon bursitis: Patient notes a bump at is left elbow.  He had a rash around the area that he felt was poison oak.  He notes that got better and the skin scabbed over.  He subsequently developed a enlargement around his olecranon process and notes it feels spongy.  No pain with this.  Social History   Tobacco Use  Smoking Status Never  Smokeless Tobacco Never    Current Outpatient Medications on File Prior to Visit  Medication Sig Dispense Refill   amLODipine (NORVASC) 5 MG tablet TAKE 1 TABLET BY MOUTH ONCE DAILY (NEEDS  APPOINTMENT) 90 tablet 0   aspirin EC 81 MG tablet Take 81 mg by mouth daily.     atorvastatin (LIPITOR) 10 MG tablet Take 1 tablet (10 mg total) by mouth daily. 90 tablet 3   gabapentin (NEURONTIN) 300 MG capsule Take 2  capsules (600 mg total) by mouth in the morning AND 3 capsules (900 mg total) at bedtime. 450 capsule 1   insulin lispro (HUMALOG KWIKPEN) 100 UNIT/ML KwikPen Inject 15 Units into the skin 3 (three) times daily before meals. 15 mL 2   lisinopril (ZESTRIL) 20 MG tablet Take 2 tablets by mouth once daily 180 tablet 1   metFORMIN (GLUCOPHAGE-XR) 500 MG 24 hr tablet TAKE 4 TABLETS BY MOUTH ONCE DAILY WITH BREAKFAST (APPOINTMENT  REQUIRED  FOR  FUTURE  REFILLS) 360 tablet 1   propranolol ER (INDERAL LA) 60 MG 24 hr capsule Take 1 capsule by mouth once daily 90 capsule 0   RELION PEN NEEDLES 32G X 4 MM MISC USE AS DIRECTED DAILY WITH INSULIN 100 each 0   Vitamin D, Ergocalciferol, 2000 units CAPS Take by mouth daily.     No current facility-administered medications on file prior to visit.     ROS see history of present illness  Objective  Physical Exam Vitals:   06/16/23 1447 06/16/23 1547  BP: 124/82 122/78  Pulse: 73   Temp: 97.8 F (36.6 C)   SpO2: 99%     BP Readings from Last 3 Encounters:  06/16/23 122/78  03/10/23 126/76  11/16/22 124/74   Wt Readings from Last 3 Encounters:  06/16/23 176 lb 9.6 oz (80.1 kg)  03/10/23 182 lb 9.6 oz (82.8 kg)  11/16/22 183 lb 12.8 oz (83.4 kg)    Physical Exam Constitutional:      General: He is not in acute distress.    Appearance: He is not diaphoretic.  Cardiovascular:     Rate and Rhythm: Normal rate and regular rhythm.     Heart sounds: Normal heart sounds.  Pulmonary:     Effort: Pulmonary effort is normal.     Breath sounds: Normal breath sounds.  Musculoskeletal:     Comments: Olecranon bursitis at the left elbow.  There is a small scab over this area.  There is no warmth or tenderness.  No significant erythema.  Skin:    General: Skin is warm and dry.  Neurological:     Mental Status: He is alert.      Assessment/Plan: Please see individual problem list.  Uncontrolled type 2 diabetes mellitus with hyperglycemia  (HCC) Assessment & Plan: Chronic issue.  Uncontrolled.  It appears patient was confused about his insulin regimen.  Patient was supposed to remain on Basaglar and start Humalog in addition to the [employer].  He will restart Basaglar 54 units once daily.  He will take his Humalog 15 units 2-3 times daily with meals.  He discussed taking the Humalog 10 to 15 minutes before the meal.  Advised if he does not eat a meal he does not need to take the Humalog.  Check A1c today.  Advised to monitor for hypoglycemia with restarting his Basaglar.  Orders: -     FreeStyle Libre 2 Sensor; USE AS DIRECTED THREE TIMES DAILY TO  CHECK  SUGARS  CHANGE  OUT  EVERY  14  DAYS  Dispense: 6 each; Refill: 0 -     Basaglar KwikPen; INJECT 54 UNITS SUBCUTANEOUSLY ONCE DAILY.  Dispense: 60 mL; Refill: 1 -     Hemoglobin A1c  Olecranon bursitis of left elbow Assessment & Plan: Patient olecranon bursitis.  This does not appear to be infected at this time.  Refer to orthopedics to consider drainage.  Orders: -     Ambulatory referral to Orthopedic Surgery  Mixed hyperlipidemia -     Comprehensive metabolic panel -     Lipid panel  Essential hypertension Assessment & Plan: Chronic issue.  Patient will continue amlodipine 5 mg daily, propranolol 60 mg daily, and lisinopril 40 mg daily.   Upper respiratory tract infection, unspecified type Assessment & Plan: Seems to have improved some.  Given drowsiness with promethazine I advised to discontinue this.  Tessalon has not been beneficial so we will stop that as well.  If he is not improving over the rest of the week he will let us know.   Health maintenance: Patient was encouraged to get the Shingrix vaccine at the pharmacy given his work schedule.  Return in about 3 months (around 09/16/2023) for DM.   Marikay Alar, MD [facility]

## 2023-06-16 NOTE — Assessment & Plan Note (Signed)
Chronic issue.  Uncontrolled.  It appears patient was confused about his insulin regimen.  Patient was supposed to remain on Basaglar and start Humalog in addition to the [employer].  He will restart Basaglar 54 units once daily.  He will take his Humalog 15 units 2-3 times daily with meals.  He discussed taking the Humalog 10 to 15 minutes before the meal.  Advised if he does not eat a meal he does not need to take the Humalog.  Check A1c today.  Advised to monitor for hypoglycemia with restarting his Basaglar.

## 2023-06-16 NOTE — Assessment & Plan Note (Addendum)
Chronic issue.  Patient will continue amlodipine 5 mg daily, propranolol 60 mg daily, and lisinopril 40 mg daily.

## 2023-06-17 LAB — LIPID PANEL
Cholesterol: 95 mg/dL (ref 0–200)
HDL: 29.8 mg/dL — ABNORMAL LOW (ref >39.00)
LDL Cholesterol: 43 mg/dL (ref 0–99)
NonHDL: 65.35
Total CHOL/HDL Ratio: 3
Triglycerides: 112 mg/dL (ref 0.0–149.0)
VLDL: 22.4 mg/dL (ref 0.0–40.0)

## 2023-06-17 LAB — COMPREHENSIVE METABOLIC PANEL
ALT: 18 U/L (ref 0–53)
AST: 15 U/L (ref 0–37)
Albumin: 3.9 g/dL (ref 3.5–5.2)
Alkaline Phosphatase: 111 U/L (ref 39–117)
BUN: 11 mg/dL (ref 6–23)
CO2: 26 meq/L (ref 19–32)
Calcium: 9.1 mg/dL (ref 8.4–10.5)
Chloride: 101 meq/L (ref 96–112)
Creatinine, Ser: 1.17 mg/dL (ref 0.40–1.50)
GFR: 65.94 mL/min (ref >60.00)
Glucose, Bld: 288 mg/dL — ABNORMAL HIGH (ref 70–99)
Potassium: 4.3 meq/L (ref 3.5–5.1)
Sodium: 135 meq/L (ref 135–145)
Total Bilirubin: 0.4 mg/dL (ref 0.2–1.2)
Total Protein: 6.1 g/dL (ref 6.0–8.3)

## 2023-06-17 LAB — HEMOGLOBIN A1C: Hgb A1c MFr Bld: 11.6 % — ABNORMAL HIGH (ref 4.6–6.5)

## 2023-06-20 ENCOUNTER — Other Ambulatory Visit: Payer: Self-pay | Admitting: Family Medicine

## 2023-06-20 DIAGNOSIS — I10 Essential (primary) hypertension: Secondary | ICD-10-CM

## 2023-06-20 DIAGNOSIS — M5416 Radiculopathy, lumbar region: Secondary | ICD-10-CM

## 2023-06-20 DIAGNOSIS — G629 Polyneuropathy, unspecified: Secondary | ICD-10-CM

## 2023-06-25 DIAGNOSIS — M7022 Olecranon bursitis, left elbow: Secondary | ICD-10-CM | POA: Diagnosis not present

## 2023-07-02 DIAGNOSIS — M7022 Olecranon bursitis, left elbow: Secondary | ICD-10-CM | POA: Diagnosis not present

## 2023-08-01 ENCOUNTER — Other Ambulatory Visit: Payer: Self-pay | Admitting: Family Medicine

## 2023-08-01 DIAGNOSIS — E782 Mixed hyperlipidemia: Secondary | ICD-10-CM

## 2023-09-03 DIAGNOSIS — M25511 Pain in right shoulder: Secondary | ICD-10-CM | POA: Diagnosis not present

## 2023-09-03 DIAGNOSIS — E119 Type 2 diabetes mellitus without complications: Secondary | ICD-10-CM | POA: Diagnosis not present

## 2023-09-03 DIAGNOSIS — M25512 Pain in left shoulder: Secondary | ICD-10-CM | POA: Diagnosis not present

## 2023-09-09 ENCOUNTER — Other Ambulatory Visit: Payer: Self-pay | Admitting: Family Medicine

## 2023-09-12 ENCOUNTER — Other Ambulatory Visit: Payer: Self-pay | Admitting: Family Medicine

## 2023-09-12 DIAGNOSIS — G629 Polyneuropathy, unspecified: Secondary | ICD-10-CM

## 2023-09-12 DIAGNOSIS — I10 Essential (primary) hypertension: Secondary | ICD-10-CM

## 2023-09-12 DIAGNOSIS — M5416 Radiculopathy, lumbar region: Secondary | ICD-10-CM

## 2023-09-20 ENCOUNTER — Ambulatory Visit: Payer: BC Managed Care – PPO | Admitting: Family Medicine

## 2023-09-24 ENCOUNTER — Other Ambulatory Visit: Payer: Self-pay | Admitting: Family Medicine

## 2023-09-24 ENCOUNTER — Ambulatory Visit (INDEPENDENT_AMBULATORY_CARE_PROVIDER_SITE_OTHER): Payer: BC Managed Care – PPO | Admitting: Family Medicine

## 2023-09-24 ENCOUNTER — Encounter: Payer: Self-pay | Admitting: Family Medicine

## 2023-09-24 VITALS — BP 128/76 | HR 74 | Ht 70.0 in | Wt 172.8 lb

## 2023-09-24 DIAGNOSIS — J309 Allergic rhinitis, unspecified: Secondary | ICD-10-CM

## 2023-09-24 DIAGNOSIS — I10 Essential (primary) hypertension: Secondary | ICD-10-CM | POA: Diagnosis not present

## 2023-09-24 DIAGNOSIS — E1165 Type 2 diabetes mellitus with hyperglycemia: Secondary | ICD-10-CM

## 2023-09-24 DIAGNOSIS — Z794 Long term (current) use of insulin: Secondary | ICD-10-CM

## 2023-09-24 DIAGNOSIS — M25512 Pain in left shoulder: Secondary | ICD-10-CM

## 2023-09-24 DIAGNOSIS — E782 Mixed hyperlipidemia: Secondary | ICD-10-CM

## 2023-09-24 DIAGNOSIS — M25511 Pain in right shoulder: Secondary | ICD-10-CM | POA: Insufficient documentation

## 2023-09-24 LAB — POCT GLYCOSYLATED HEMOGLOBIN (HGB A1C): Hemoglobin A1C: 10.3 % — AB (ref 4.0–5.6)

## 2023-09-24 MED ORDER — BASAGLAR KWIKPEN 100 UNIT/ML ~~LOC~~ SOPN: PEN_INJECTOR | SUBCUTANEOUS | 1 refills | Status: DC

## 2023-09-24 NOTE — Assessment & Plan Note (Signed)
Chronic issue.  Uncontrolled.  Check A1c.  Patient will continue Basaglar 54 units daily and Humalog 15 units 3 times daily with meals.

## 2023-09-24 NOTE — Assessment & Plan Note (Signed)
Possibly related rotator cuff tendinitis.  Patient will see orthopedics as planned.

## 2023-09-24 NOTE — Assessment & Plan Note (Signed)
Chronic issue.  Recently well-controlled.  Patient will continue Lipitor 10 mg daily.

## 2023-09-24 NOTE — Assessment & Plan Note (Signed)
Chronic issue.  Adequately controlled.  Patient will continue amlodipine 5 mg daily, propranolol 60 mg daily, and lisinopril 40 mg daily.

## 2023-09-24 NOTE — Progress Notes (Signed)
Marikay Alar, MD Phone: [phone]  Jonathan West is a 63 y.o. male who presents today for f/u  HYPERTENSION Disease Monitoring: Blood pressure range-not checking Chest pain- no      Dyspnea- no Medications: Compliance- taking amlodipine, lisinopril, propranolol  DIABETES Disease Monitoring: Blood Sugar ranges-140-240 Polyuria/phagia/dipsia- no      Optho- due Medications: Compliance- taking basaglar 54 u, humalog 15 u TID with meals Hypoglycemic symptoms- rare  HYPERLIPIDEMIA Disease Monitoring: See symptoms for Hypertension Medications: Compliance- taking lipitor Right upper quadrant pain- no  Muscle aches- no  Hoarseness: Patient notes he had a infection about 3 months ago where he had cough.  He is continue to have some hoarseness and postnasal drip since then.  He is not taking anything for this.  Bilateral shoulder pain: Notes he went to orthopedics and had injections which were beneficial.  He follows up with orthopedics later this month.    Social History   Tobacco Use  Smoking Status Never  Smokeless Tobacco Never    Current Outpatient Medications on File Prior to Visit  Medication Sig Dispense Refill   amLODipine (NORVASC) 5 MG tablet TAKE 1 TABLET BY MOUTH ONCE DAILY (NEEDS  APPOINTMENT) 90 tablet 0   aspirin EC 81 MG tablet Take 81 mg by mouth daily.     atorvastatin (LIPITOR) 10 MG tablet Take 1 tablet by mouth once daily 90 tablet 0   Continuous Glucose Sensor (FREESTYLE LIBRE 2 SENSOR) MISC USE AS DIRECTED THREE TIMES DAILY TO  CHECK  SUGARS  CHANGE  OUT  EVERY  14  DAYS 6 each 0   cyanocobalamin (VITAMIN B12) 1000 MCG tablet Take 1,000 mcg by mouth daily.     gabapentin (NEURONTIN) 300 MG capsule TAKE 2 CAPSULES BY MOUTH IN THE MORNING AND 3 AT BEDTIME 450 capsule 0   glimepiride (AMARYL) 2 MG tablet Take 2 mg by mouth daily with breakfast.     Insulin Glargine (BASAGLAR KWIKPEN) 100 UNIT/ML INJECT 54 UNITS SUBCUTANEOUSLY ONCE DAILY. 60 mL 1    insulin lispro (HUMALOG KWIKPEN) 100 UNIT/ML KwikPen Inject 15 Units into the skin 3 (three) times daily before meals. 15 mL 2   lisinopril (ZESTRIL) 20 MG tablet Take 2 tablets by mouth once daily 180 tablet 1   metFORMIN (GLUCOPHAGE-XR) 500 MG 24 hr tablet TAKE 4 TABLETS BY MOUTH ONCE DAILY WITH BREAKFAST (APPOINTMENT  REQUIRED  FOR  FUTURE  REFILLS) 360 tablet 0   propranolol ER (INDERAL LA) 60 MG 24 hr capsule Take 1 capsule by mouth once daily 90 capsule 0   RELION PEN NEEDLES 32G X 4 MM MISC USE AS DIRECTED DAILY WITH INSULIN 100 each 0   Vitamin D, Ergocalciferol, 2000 units CAPS Take 2 tablets by mouth daily. Take 2 tablets by mouth daily     No current facility-administered medications on file prior to visit.     ROS see history of present illness  Objective  Physical Exam Vitals:   09/24/23 0923  BP: 128/76  Pulse: 74  SpO2: 99%    BP Readings from Last 3 Encounters:  09/24/23 128/76  06/16/23 122/78  03/10/23 126/76   Wt Readings from Last 3 Encounters:  09/24/23 172 lb 12.8 oz (78.4 kg)  06/16/23 176 lb 9.6 oz (80.1 kg)  03/10/23 182 lb 9.6 oz (82.8 kg)    Physical Exam Constitutional:      General: He is not in acute distress.    Appearance: He is not diaphoretic.  Cardiovascular:  Rate and Rhythm: Normal rate and regular rhythm.     Heart sounds: Normal heart sounds.  Pulmonary:     Effort: Pulmonary effort is normal.     Breath sounds: Normal breath sounds.  Musculoskeletal:     Right lower leg: No edema.     Left lower leg: No edema.  Skin:    General: Skin is warm and dry.  Neurological:     Mental Status: He is alert.      Assessment/Plan: Please see individual problem list.  Essential hypertension Assessment & Plan: Chronic issue.  Adequately controlled.  Patient will continue amlodipine 5 mg daily, propranolol 60 mg daily, and lisinopril 40 mg daily.   Allergic rhinitis, unspecified seasonality, unspecified trigger Assessment &  Plan: Suspect hoarseness and postnasal drip are related to allergies.  He will try adding Zyrtec over-the-counter.  If not beneficial he will let us know we can refer to ENT for evaluation of his hoarseness.   Uncontrolled type 2 diabetes mellitus with hyperglycemia (HCC) Assessment & Plan: Chronic issue.  Uncontrolled.  Check A1c.  Patient will continue Basaglar 54 units daily and Humalog 15 units 3 times daily with meals.  Orders: -     POCT glycosylated hemoglobin (Hb A1C)  Mixed hyperlipidemia Assessment & Plan: Chronic issue.  Recently well-controlled.  Patient will continue Lipitor 10 mg daily.   Acute pain of both shoulders Assessment & Plan: Possibly related rotator cuff tendinitis.  Patient will see orthopedics as planned.     Return in about 3 months (around 12/23/2023) for Transfer of care.   Marikay Alar, MD [facility]

## 2023-09-24 NOTE — Assessment & Plan Note (Signed)
Suspect hoarseness and postnasal drip are related to allergies.  He will try adding Zyrtec over-the-counter.  If not beneficial he will let us know we can refer to ENT for evaluation of his hoarseness.

## 2023-09-24 NOTE — Patient Instructions (Signed)
Nice to see you. Please make sure you are seeing ophthalmology once yearly for diabetic eye exam.

## 2023-10-05 DIAGNOSIS — M25512 Pain in left shoulder: Secondary | ICD-10-CM | POA: Diagnosis not present

## 2023-10-05 DIAGNOSIS — M25511 Pain in right shoulder: Secondary | ICD-10-CM | POA: Diagnosis not present

## 2023-10-12 ENCOUNTER — Other Ambulatory Visit: Payer: Self-pay | Admitting: Family Medicine

## 2023-10-12 DIAGNOSIS — I10 Essential (primary) hypertension: Secondary | ICD-10-CM

## 2023-10-12 DIAGNOSIS — E782 Mixed hyperlipidemia: Secondary | ICD-10-CM

## 2023-10-28 ENCOUNTER — Other Ambulatory Visit: Payer: Self-pay | Admitting: Family Medicine

## 2023-10-28 DIAGNOSIS — E782 Mixed hyperlipidemia: Secondary | ICD-10-CM

## 2023-10-29 DIAGNOSIS — M25512 Pain in left shoulder: Secondary | ICD-10-CM | POA: Diagnosis not present

## 2023-10-31 ENCOUNTER — Other Ambulatory Visit: Payer: Self-pay | Admitting: Family Medicine

## 2023-11-02 ENCOUNTER — Encounter: Payer: Self-pay | Admitting: Family Medicine

## 2023-11-03 DIAGNOSIS — M75102 Unspecified rotator cuff tear or rupture of left shoulder, not specified as traumatic: Secondary | ICD-10-CM | POA: Diagnosis not present

## 2023-11-18 DIAGNOSIS — M75112 Incomplete rotator cuff tear or rupture of left shoulder, not specified as traumatic: Secondary | ICD-10-CM | POA: Diagnosis not present

## 2023-12-06 DIAGNOSIS — M75112 Incomplete rotator cuff tear or rupture of left shoulder, not specified as traumatic: Secondary | ICD-10-CM | POA: Diagnosis not present

## 2023-12-14 ENCOUNTER — Other Ambulatory Visit: Payer: Self-pay

## 2023-12-14 MED ORDER — METFORMIN HCL ER 500 MG PO TB24: 2000.0000 mg | ORAL_TABLET | Freq: Every day | ORAL | 0 refills | Status: DC

## 2023-12-21 ENCOUNTER — Other Ambulatory Visit: Payer: Self-pay

## 2023-12-21 DIAGNOSIS — G629 Polyneuropathy, unspecified: Secondary | ICD-10-CM

## 2023-12-21 DIAGNOSIS — M5416 Radiculopathy, lumbar region: Secondary | ICD-10-CM

## 2023-12-21 MED ORDER — GABAPENTIN 300 MG PO CAPS: ORAL_CAPSULE | ORAL | 0 refills | Status: DC

## 2023-12-28 ENCOUNTER — Encounter: Payer: Self-pay | Admitting: Family Medicine

## 2023-12-28 ENCOUNTER — Ambulatory Visit (INDEPENDENT_AMBULATORY_CARE_PROVIDER_SITE_OTHER): Payer: BC Managed Care – PPO | Admitting: Family Medicine

## 2023-12-28 VITALS — BP 130/66 | HR 68 | Temp 98.6°F | Resp 20 | Ht 70.0 in | Wt 182.2 lb

## 2023-12-28 DIAGNOSIS — E118 Type 2 diabetes mellitus with unspecified complications: Secondary | ICD-10-CM

## 2023-12-28 DIAGNOSIS — Z794 Long term (current) use of insulin: Secondary | ICD-10-CM

## 2023-12-28 DIAGNOSIS — E1169 Type 2 diabetes mellitus with other specified complication: Secondary | ICD-10-CM

## 2023-12-28 DIAGNOSIS — E785 Hyperlipidemia, unspecified: Secondary | ICD-10-CM

## 2023-12-28 DIAGNOSIS — E119 Type 2 diabetes mellitus without complications: Secondary | ICD-10-CM

## 2023-12-28 DIAGNOSIS — E559 Vitamin D deficiency, unspecified: Secondary | ICD-10-CM

## 2023-12-28 DIAGNOSIS — E1159 Type 2 diabetes mellitus with other circulatory complications: Secondary | ICD-10-CM

## 2023-12-28 DIAGNOSIS — E538 Deficiency of other specified B group vitamins: Secondary | ICD-10-CM

## 2023-12-28 DIAGNOSIS — E1140 Type 2 diabetes mellitus with diabetic neuropathy, unspecified: Secondary | ICD-10-CM

## 2023-12-28 DIAGNOSIS — I152 Hypertension secondary to endocrine disorders: Secondary | ICD-10-CM

## 2023-12-28 LAB — POCT GLYCOSYLATED HEMOGLOBIN (HGB A1C): Hemoglobin A1C: 8.7 % — AB (ref 4.0–5.6)

## 2023-12-28 MED ORDER — FREESTYLE LIBRE 3 SENSOR MISC
6 refills | Status: AC
Start: 2023-12-28 — End: ?

## 2023-12-28 MED ORDER — DAPAGLIFLOZIN PROPANEDIOL 5 MG PO TABS: 5.0000 mg | ORAL_TABLET | Freq: Every day | ORAL | Status: DC

## 2023-12-28 MED ORDER — PROPRANOLOL HCL ER 60 MG PO CP24: 60.0000 mg | ORAL_CAPSULE | Freq: Every day | ORAL | 0 refills | Status: DC

## 2023-12-28 MED ORDER — SEMAGLUTIDE(0.25 OR 0.5MG/DOS) 2 MG/3ML ~~LOC~~ SOPN: 0.2500 mg | PEN_INJECTOR | SUBCUTANEOUS | 0 refills | Status: DC

## 2023-12-28 MED ORDER — SEMAGLUTIDE(0.25 OR 0.5MG/DOS) 2 MG/3ML ~~LOC~~ SOPN: 0.2500 mg | PEN_INJECTOR | SUBCUTANEOUS | Status: DC

## 2023-12-28 MED ORDER — DAPAGLIFLOZIN PROPANEDIOL 5 MG PO TABS: 5.0000 mg | ORAL_TABLET | Freq: Every day | ORAL | 0 refills | Status: DC

## 2023-12-28 NOTE — Progress Notes (Signed)
 SUBJECTIVE:   Chief Complaint  Patient presents with   Medical Management of Chronic Issues    3 month follow up   HPI Presents for follow up chronic disease management  Discussed the use of AI scribe software for clinical note transcription with the patient, who gave verbal consent to proceed.  History of Present Illness Jonathan West "Jonathan West" is a 63 year old male with diabetes who presents for a three-month follow-up for diabetes management.  He is here for a three-month follow-up after previously seeing another physician. His A1c was high at that time and is currently 8.7. He has been on Amaryl  (glimepiride ) 2 mg for a long time and is unsure about some of his medications. He recalls being put on a medication for heart benefits despite not having heart issues. He is also on amlodipine , propranolol , and lisinopril , but is unsure why he is on multiple blood pressure medications as he has not had heart attacks, strokes, or palpitations.  He has a significant family history of heart attacks, with his grandfathers and father having heart attacks at young ages. He has never seen a cardiologist or had a heart echo. He is on a baby aspirin and a cholesterol medication, though he has never had cholesterol problems. He is unsure when his cholesterol was last checked.  He uses a [employer] 2 for glucose monitoring and needs a refill. He has been lax in using it and the refills expired. He is interested in trying the [employer] 3. He takes vitamin B12 and vitamin D , and is on gabapentin  (Neurontin ) 600 mg in the morning and 900 mg at night for pain management. He noticed increased pain when the dose was lowered.  He has been on glimepiride  for years and needs a refill. He takes Basaglar  60 units at bedtime and has not been using Humalog  regularly for several months due to his irregular eating habits. He recalls his A1c was 16.1 and glucose was 744 when first diagnosed with diabetes at age  17. He was previously on Jardiance  and Ozempic , which controlled his glucose well, but could not continue due to cost.  He has [employer] insurance and has had issues with medication costs and coverage. He has been on metformin  2000 mg and lisinopril  20 mg. He has had a colonoscopy in the last two years. No chest pain, shortness of breath, or palpitations. He has had shoulder problems recently, impacting his ability to follow up with an endocrinologist.    PERTINENT PMH / PSH: As above  OBJECTIVE:  BP 130/66   Pulse 68   Temp 98.6 F (37 C)   Resp 20   Ht 5\' 10"  (1.778 m)   Wt 182 lb 4 oz (82.7 kg)   SpO2 99%   BMI 26.15 kg/m    Physical Exam Vitals reviewed.  HENT:     Head: Normocephalic.     Right Ear: Tympanic membrane, ear canal and external ear normal.     Left Ear: Tympanic membrane, ear canal and external ear normal.     Nose: Nose normal.     Mouth/Throat:     Mouth: Mucous membranes are moist.  Eyes:     Conjunctiva/sclera: Conjunctivae normal.     Pupils: Pupils are equal, round, and reactive to light.  Neck:     Thyroid : No thyromegaly or thyroid  tenderness.     Vascular: No carotid bruit.  Cardiovascular:     Rate and Rhythm: Normal rate  and regular rhythm.     Pulses: Normal pulses.     Heart sounds: Normal heart sounds.  Pulmonary:     Effort: Pulmonary effort is normal.     Breath sounds: Normal breath sounds.  Abdominal:     General: Abdomen is flat. Bowel sounds are normal.     Palpations: Abdomen is soft.  Musculoskeletal:        General: Normal range of motion.     Cervical back: Normal range of motion and neck supple.     Right lower leg: No edema.     Left lower leg: No edema.  Lymphadenopathy:     Cervical: No cervical adenopathy.  Neurological:     Mental Status: He is alert.  Psychiatric:        Mood and Affect: Mood normal.        Behavior: Behavior normal.        Thought Content: Thought content normal.        Judgment:  Judgment normal.           12/28/2023    2:12 PM 09/24/2023    9:24 AM 06/16/2023    3:03 PM 03/10/2023    4:10 PM 11/16/2022    8:55 AM  Depression screen PHQ 2/9  Decreased Interest 0 0 0 0 0  Down, Depressed, Hopeless 0 0 0 0 0  PHQ - 2 Score 0 0 0 0 0  Altered sleeping 0 0 0 0 0  Tired, decreased energy 0 0 0 0 1  Change in appetite 0 0 0 0 0  Feeling bad or failure about yourself  0 0 0 0 0  Trouble concentrating 0 0 0 0 0  Moving slowly or fidgety/restless 0 0 0 0 0  Suicidal thoughts 0 0 0 0 0  PHQ-9 Score 0 0 0 0 1  Difficult doing work/chores Not difficult at all  Not difficult at all Not difficult at all Not difficult at all      12/28/2023    2:12 PM 09/24/2023    9:24 AM 06/16/2023    3:03 PM 03/10/2023    4:10 PM  GAD 7 : Generalized Anxiety Score  Nervous, Anxious, on Edge 0 0 0 0  Control/stop worrying 0 0 0 0  Worry too much - different things 0 0 0 0  Trouble relaxing 0 0 0 0  Restless 0 0 0 0  Easily annoyed or irritable 0 0 0 0  Afraid - awful might happen 0 0 0 0  Total GAD 7 Score 0 0 0 0  Anxiety Difficulty Not difficult at all  Not difficult at all Not difficult at all    ASSESSMENT/PLAN:  Type 2 diabetes mellitus with complications (HCC) Assessment & Plan: A1c 8.7, suboptimal control. Interest in restarting Jardiance  and Ozempic  if financial aid available. Discussed financial assistance options and potential insulin  reduction with these medications. - Explore financial assistance for SGLT2 and Ozempic . - Start Farxiga  5 mg daily. Sample provided.  - Start Ozempic  0.25 mg weekly.  Sample provided - Refer to pharmacist for medication management and financial aid. - Trial Freestyle Libre 3 for continuous glucose monitoring. - Follow-up in 2 weeks for reassessment. - Continue Basaglar  60 unoits daily - Continue Metformin  XR 1000 mg BID - Taking Amaryl  2 mg BID.  Per review of chart this was previously discontinued, however patient continues to take.    - Plan to wean Amaryl  and insulin  in future - Has not  been taking Lispro 15 units with meals as prescribed  Orders: -     POCT glycosylated hemoglobin (Hb A1C) -     Microalbumin / creatinine urine ratio -     Semaglutide (0.25 or 0.5MG /DOS); Inject 0.25 mg into the skin once a week. -     Semaglutide (0.25 or 0.5MG /DOS); Inject 0.25 mg into the skin once a week.  Dispense: 3 mL; Refill: 0 -     Dapagliflozin  Propanediol; Take 1 tablet (5 mg total) by mouth daily.  Dispense: 21 tablet -     Dapagliflozin  Propanediol; Take 1 tablet (5 mg total) by mouth daily.  Dispense: 30 tablet; Refill: 0 -     FreeStyle Libre 3 Sensor; Place 1 sensor on the skin every 14 days. Use to check glucose continuously  Dispense: 2 each; Refill: 6 -     Comprehensive metabolic panel with GFR; Future -     AMB Referral VBCI Care Management  Hypertension associated with diabetes [facility]) Assessment & Plan: On amlodipine , propranolol , lisinopril . Family history of heart disease. - Review propranolol  necessity, consider carvedilol.  - Monitor BP and heart rate. - Continue Lisinopril  20 mg daily - Continue Amlodipine  5 mg daily - Plan to switch Inderal  to Carvedilol at next visit  Orders: -     Propranolol  HCl ER; Take 1 capsule (60 mg total) by mouth daily.  Dispense: 30 capsule; Refill: 0 -     AMB Referral VBCI Care Management  Hyperlipidemia associated with type 2 diabetes mellitus (HCC) Assessment & Plan: On low-dose cholesterol medication. No recent levels. Discussed Repatha if control inadequate, cost a factor. Strong family history,  paternal MI 93's, brother with CAD. Reports no personal history or current cardiology follow-up. Review of chart noted to have been evaluated by Dr Jerelene Monday in 2019.  Cardiac work up was obtained and recommended f/u as needed. Calcium  score 88% at that time.   - Check recent cholesterol levels. - Continue current statin - Consider increasing current medication dose if needed. -  Evaluate Repatha use based on levels and cost.  Orders: -     Lipid panel; Future -     AMB Referral VBCI Care Management  Type 2 diabetes mellitus with diabetic neuropathy, with long-term current use of insulin  (HCC) Assessment & Plan: Current gabapentin  regimen effective. - Continue gabapentin  600 mg AM, 900 mg PM  Orders: -     AMB Referral VBCI Care Management  B12 deficiency -     Vitamin B12; Future  Vitamin D  deficiency -     VITAMIN D  25 Hydroxy (Vit-D Deficiency, Fractures); Future     PDMP reviewed  Return in about 2 weeks (around 01/11/2024) for PCP.  Valli Gaw, MD

## 2023-12-28 NOTE — Progress Notes (Signed)
 Medication Samples have been provided to the patient.  Drug name: Farxiga        Strength: 5 MG        Qty: 3  LOT: [id number] Exp.Date: 04540981  Dosing instructions: Take 1 tablet (5 mg total) by mouth daily.   The patient has been instructed regarding the correct time, dose, and frequency of taking this medication, including desired effects and most common side effects.   [name]  [name] 3:13 PM 12/28/2023   Medication Samples have been provided to the patient.  Drug name: Ozempic        Strength: 0.25/0.5        Qty: 1 LOT: [id number].Date: [id number]  Dosing instructions: Inject 0.25 mg into the skin once a week.   The patient has been instructed regarding the correct time, dose, and frequency of taking this medication, including desired effects and most common side effects.   [name]  [name] 3:14 PM 12/28/2023

## 2023-12-28 NOTE — Patient Instructions (Addendum)
 It was a pleasure meeting you today. Thank you for allowing me to take part in your health care.  Our goals for today as we discussed include:  Start Farxiga  5 mg daily.  Samples provided Start Ozempic  0.25 mg weekly.  Sample provided  Have reached out to pharmacy for assistance  Continue current medication for diabetes. Monitor blood glucose Will decrease as glucose decreases  Freestyle Libre 3 ordered. Sample provided  Follow up in 2 weeks   This is a list of the screening recommended for you and due dates:  Health Maintenance  Topic Date Due   HIV Screening  Never done   Zoster (Shingles) Vaccine (1 of 2) Never done   Pneumococcal Vaccination (2 of 2 - PCV) 06/28/2015   Eye exam for diabetics  08/19/2019   Yearly kidney health urinalysis for diabetes  11/16/2023   Complete foot exam   11/16/2023   Flu Shot  04/07/2024   Yearly kidney function blood test for diabetes  06/15/2024   Hemoglobin A1C  06/28/2024   DTaP/Tdap/Td vaccine (2 - Td or Tdap) 05/30/2028   Colon Cancer Screening  02/15/2033   Hepatitis C Screening  Completed   HPV Vaccine  Aged Out   Meningitis B Vaccine  Aged Out   COVID-19 Vaccine  Discontinued      If you have any questions or concerns, please do not hesitate to call the office at [phone].  I look forward to our next visit and until then take care and stay safe.  Regards,   Valli Gaw, MD   [facility]

## 2023-12-29 LAB — MICROALBUMIN / CREATININE URINE RATIO
Creatinine,U: 44.4 mg/dL
Microalb Creat Ratio: UNDETERMINED mg/g (ref 0.0–30.0)
Microalb, Ur: <0.7 mg/dL

## 2024-01-02 ENCOUNTER — Encounter: Payer: Self-pay | Admitting: Family Medicine

## 2024-01-02 NOTE — Assessment & Plan Note (Signed)
 Current gabapentin  regimen effective. - Continue gabapentin  600 mg AM, 900 mg PM

## 2024-01-02 NOTE — Assessment & Plan Note (Signed)
 On low-dose cholesterol medication. No recent levels. Discussed Repatha if control inadequate, cost a factor. Strong family history,  paternal MI 72's, brother with CAD. Reports no personal history or current cardiology follow-up. Review of chart noted to have been evaluated by Dr Jonathan West in 2019.  Cardiac work up was obtained and recommended f/u as needed. Calcium  score 88% at that time.   - Check recent cholesterol levels. - Continue current statin - Consider increasing current medication dose if needed. - Evaluate Repatha use based on levels and cost.

## 2024-01-02 NOTE — Assessment & Plan Note (Addendum)
 A1c 8.7, suboptimal control. Interest in restarting Jardiance  and Ozempic  if financial aid available. Discussed financial assistance options and potential insulin  reduction with these medications. - Explore financial assistance for SGLT2 and Ozempic . - Start Farxiga  5 mg daily. Sample provided.  - Start Ozempic  0.25 mg weekly.  Sample provided - Refer to pharmacist for medication management and financial aid. - Trial Freestyle Libre 3 for continuous glucose monitoring. - Follow-up in 2 weeks for reassessment. - Continue Basaglar  60 unoits daily - Continue Metformin  XR 1000 mg BID - Taking Amaryl  2 mg BID.  Per review of chart this was previously discontinued, however patient continues to take.   - Plan to wean Amaryl  and insulin  in future - Has not been taking Lispro 15 units with meals as prescribed

## 2024-01-02 NOTE — Assessment & Plan Note (Signed)
 On amlodipine , propranolol , lisinopril . Family history of heart disease. - Review propranolol  necessity, consider carvedilol.  - Monitor BP and heart rate. - Continue Lisinopril  20 mg daily - Continue Amlodipine  5 mg daily - Plan to switch Inderal  to Carvedilol at next visit

## 2024-01-04 ENCOUNTER — Telehealth: Payer: Self-pay

## 2024-01-04 NOTE — Progress Notes (Signed)
 Care Guide Pharmacy Note  01/04/2024 Name: Jonathan West MRN: [id number] DOB: 1961/04/13  Referred By: No primary care provider on file. Reason for referral: Complex Care Management (Outreach to schedule with Pharm d )   [name] is a 63 y.o. year old male who is a primary care patient of No primary care provider on file.[name] was referred to the pharmacist for assistance related to: DMII  Successful contact was made with the patient to discuss pharmacy services including being ready for the pharmacist to call at least 5 minutes before the scheduled appointment time and to have medication bottles and any blood pressure readings ready for review. The patient agreed to meet with the pharmacist via telephone visit on (date/time).01/10/2024  Lenton Rail , RMA     [place]  [facility], [facility] Guide  Direct Dial : [phone]  Website: [email or web address]

## 2024-01-06 ENCOUNTER — Other Ambulatory Visit (INDEPENDENT_AMBULATORY_CARE_PROVIDER_SITE_OTHER)

## 2024-01-06 DIAGNOSIS — E538 Deficiency of other specified B group vitamins: Secondary | ICD-10-CM | POA: Diagnosis not present

## 2024-01-06 DIAGNOSIS — E1169 Type 2 diabetes mellitus with other specified complication: Secondary | ICD-10-CM

## 2024-01-06 DIAGNOSIS — E118 Type 2 diabetes mellitus with unspecified complications: Secondary | ICD-10-CM

## 2024-01-06 DIAGNOSIS — E559 Vitamin D deficiency, unspecified: Secondary | ICD-10-CM | POA: Diagnosis not present

## 2024-01-06 DIAGNOSIS — E785 Hyperlipidemia, unspecified: Secondary | ICD-10-CM | POA: Diagnosis not present

## 2024-01-06 LAB — COMPREHENSIVE METABOLIC PANEL WITH GFR
ALT: 15 U/L (ref 0–53)
AST: 19 U/L (ref 0–37)
Albumin: 4.1 g/dL (ref 3.5–5.2)
Alkaline Phosphatase: 84 U/L (ref 39–117)
BUN: 18 mg/dL (ref 6–23)
CO2: 24 meq/L (ref 19–32)
Calcium: 9.2 mg/dL (ref 8.4–10.5)
Chloride: 108 meq/L (ref 96–112)
Creatinine, Ser: 1.22 mg/dL (ref 0.40–1.50)
GFR: 62.46 mL/min (ref >60.00)
Glucose, Bld: 58 mg/dL — ABNORMAL LOW (ref 70–99)
Potassium: 4.6 meq/L (ref 3.5–5.1)
Sodium: 141 meq/L (ref 135–145)
Total Bilirubin: 0.6 mg/dL (ref 0.2–1.2)
Total Protein: 6.8 g/dL (ref 6.0–8.3)

## 2024-01-06 LAB — VITAMIN B12: Vitamin B-12: 672 pg/mL (ref 211–911)

## 2024-01-06 LAB — LIPID PANEL
Cholesterol: 83 mg/dL (ref 0–200)
HDL: 30.4 mg/dL — ABNORMAL LOW (ref >39.00)
LDL Cholesterol: 42 mg/dL (ref 0–99)
NonHDL: 52.46
Total CHOL/HDL Ratio: 3
Triglycerides: 50 mg/dL (ref 0.0–149.0)
VLDL: 10 mg/dL (ref 0.0–40.0)

## 2024-01-06 LAB — VITAMIN D 25 HYDROXY (VIT D DEFICIENCY, FRACTURES): VITD: 62.3 ng/mL (ref 30.00–100.00)

## 2024-01-10 ENCOUNTER — Ambulatory Visit: Admitting: Family Medicine

## 2024-01-10 ENCOUNTER — Other Ambulatory Visit (INDEPENDENT_AMBULATORY_CARE_PROVIDER_SITE_OTHER): Admitting: Pharmacist

## 2024-01-10 DIAGNOSIS — E118 Type 2 diabetes mellitus with unspecified complications: Secondary | ICD-10-CM

## 2024-01-10 DIAGNOSIS — E1169 Type 2 diabetes mellitus with other specified complication: Secondary | ICD-10-CM

## 2024-01-10 DIAGNOSIS — Z794 Long term (current) use of insulin: Secondary | ICD-10-CM

## 2024-01-10 DIAGNOSIS — E1140 Type 2 diabetes mellitus with diabetic neuropathy, unspecified: Secondary | ICD-10-CM

## 2024-01-10 DIAGNOSIS — E1159 Type 2 diabetes mellitus with other circulatory complications: Secondary | ICD-10-CM

## 2024-01-10 NOTE — Patient Instructions (Signed)
 [name],   It was a pleasure to speak with you today! As we discussed:?   Medication Changes:  Reduce the amount of basaglar  insulin  from 60 units to 55 units once daily.  This decrease should help to prevent so many overnight low sugars. Ideally, your nighttime sugar most nights should remain between 70 and 130.  Continue all other medications as you have been taking them    MEDICARE  [employer]HS provides access to all Medicare patient to a Medicare specialist Engineer, production Information Program [facility]) Coordinator). This is a FREE and non-biased service for all [place]  citizens. I recommend speaking with them with a list of your medications as they can provide some insight into different Medicare plans based on your needs.  [facility] [employer]  Presenter, broadcasting Information Program [facility]) Coordinator Idella Major [facility] [address]     [place]  [phone] (Ask for [facility] help)   There are also some plans that have specific benefits for patients with heart or diabetes needs.   An example we see here commonly is [employer] Diabetes and Heart care  Consider reviewing benefit of plans that are tailored to you, your diseases, and the medicines you are prescribed.   I would make sure to get a good understanding of what a brand-name medicine will cost through your Medicare plan (such as Ozempic , Jardiance , etc.).  'Copay' vs 'Coinsurance' for prescription drugs Some plans have a set copay (example, after paying your deductible, they may charge a flat rate the remainder of the year which would be the same amount for all preferred brand drugs. For example, Ozempic  $47/month,Jardiance  $47/month.  Other medicare plans charge "coinsurance". Instead of a flat copay for brand medications, they charge a percentage of the drug cost. This leaves brand medicines unaffordable for  most patients. I recommend proceeding cautiously with plans that have a "coinsurance" for prescription medications.  Example: If Ozempic  is a $2000 without insurance, a 25% coinsurance would be $500/month.   Please reach out prior to your next scheduled appointment should you have any questions or concerns.  You may respond directly to this message, or leave me a voicemail at [phone] and I will get back to you shortly.   Thank you!   Future Appointments  Date Time Provider Department Center  01/12/2024  2:40 PM Valli Gaw, MD [facility]  03/01/2024  3:00 PM Bair, Randa Burton, MD [name]    Daron Ellen, PharmD Clinical Pharmacist [facility] [phone]

## 2024-01-10 NOTE — Progress Notes (Unsigned)
 01/11/2024 Name: Jonathan West MRN: [id number] DOB: 13-Nov-1960  Subjective  Chief Complaint  Patient presents with   Diabetes   Hyperlipidemia   Hypertension    Reason for visit: ?  Jonathan West is a 63 y.o. male with a history of diabetes (type 2), who presents today for an initial diabetes pharmacotherapy visit.? They were referred by with PCP for medication management related to Diabetes, HTN, HLD.  Pertinent PMH includes HTN, T2DM, HLD, diabetic neuropathy, ED.  Known DM Complications: autonomic neuropathy (ED), peripheral neuropathy, retinopathy  Care Team: Primary Care Provider: Dr. Valli Gaw    Date of Last Diabetes Related Visit: with PCP on 12/28/23   Recent Summary of Change: Start Farxiga  5 mg/d, Start Ozempic  0.25 mg/week   Medication Access/Adherence: Prescription drug coverage: Payor: BLUE CROSS BLUE SHIELD / Plan: BCBS COMM PPO / Product Type: *No Product type* / .  - Reports that all medications are not affordable.  - Current Patient Assistance: None Engineer, drilling) - Medication Adherence: Patient denies missing doses of their medication over the past week. Previously was not using regularly.   Since Last visit / History of Present Illness: ?  Patient reports implementing plan from last visit. Has focused on medication compliance over the past week or so without any missed doses of oral medication or insulins. Notes prior, injections were not consistent. With this he feels his BG have started to improve.   Feels glycemic control has been complicated by inconsistent eating cycles (works and is on the road often).   Shoulder surgery scheduled for July 10th, though notes surgeon will not operate if  A1c is high. Goal for surgery 8.5%.   Reported DM Regimen: ?  Farxiga  5 mg daily Glimepiride  2 mg daily before breakfast Metformin  XR 2000 mg daily with breakfast Ozempic  0.25 mg weekly Basaglar  (glargine) 60 units daily (bedtime)  Humalog  15 units  TIDAC    DM medications tried in the past:?  Metformin  IR (stomach upset, tolerated XR well) Jardiance  (stopped due to cost, tolerated well)  SMBG: ?Libre 3 (samples given in clinic - cost at pharmacy ~$74.month) CGM Report (Date Range 4/23 - 5/6) ABG 147 mg/dL; GMI 4.0%; Variability 48.4%; TIR 63%, high 14%, very high 12%, low 11%, very low 0%     Hypo/Hyperglycemia: ?  Symptoms of hypoglycemia since last visit:? yes  Some during the night. Lowest mid 50s (does have symptoms).  One during the day before dinner (skipped breakfast/lunch took glimepiride ) If yes, it was treated by:  juice   Symptoms of hyperglycemia since last visit:?No   Reported Diet: Patient typically eats 1-2 meals per day.   - 1 day per week: Has 3 meals  - 3 days per week: Has 1 meal (dinner)  - 3 days per week: Has 2 meals (lunch + dinner) Beverages: Used to drink diet soda 90% of the time. Now, drinks only water. With dinner, one diet drink. No more sweet tea.   DM Prevention:  Statin: Taking; moderate intensity.?  History of chronic kidney disease? no History of albuminuria? yes, (54 mg/g 2017), last UACR on 12/28/23 = too low to calculate ACE/ARB - Taking lisinorpil 40 mg daily; Urine MA/CR Ratio - normal.  Last eye exam: 08/2018; Retinopathy Present Last foot exam: 11/16/2022 Tobacco Use: Never smoker Immunizations:? Flu: Due (Last: 06/28/2020); Pneumococcal: No Record - DUE; Shingrix: No Record - DUE   Cardiovascular Risk Reduction History of clinical ASCVD? no History of heart failure? no  History  of hyperlipidemia? yes Current BMI: 26.1 kg/m2 (Ht 70 in, Wt 82.7 kg) Taking statin? yes; moderate intensity (atorvastatin  10) Taking aspirin? unclear if indicated; Taking   Taking SGLT-2i? yes (medication sample) Taking GLP- 1 RA? yes (medication sample)   Reported HTN Regimen: ?  Lisinopril  40 mg daily Amlodipine  5 mg daily  Propranolol  ER 60 mg daily Self-discontinued 2015 - reported no change  to BP with stopping.  07/2015: Re-started ("Pt reports his BP is creeping upwards, was on propranolol  in past with good results. He does have migraines and this maybe helpful for both.")   Patient reports severe migraines in childhood resulting in Na/vomiting. None in the past 5 years with vomiting. Minimal c/f headaches the past 2-3 years. Infrequent mild headache resolved with OTC medication. Notes he never noticed a difference on vs off of propranolol  for his migraines.   Patient denies hypotensive s/sx. No dizziness, lightheadedness.  Patient denies hypertensive symptoms. No headache, chest pain, shortness of breath, visual changes.     _______________________________________________  Objective    Review of Systems:? Limited in the setting of virtual visit  Constitutional:? No fever, chills or unintentional weight loss  Cardiovascular:? No chest pain or pressure, shortness of breath, dyspnea on exertion, orthopnea or LE edema  GI:? No nausea, vomiting, constipation, diarrhea, abdominal pain, dyspepsia, change in bowel habits  Endocrine:? No polyuria, polyphagia or blurred vision    Physical Examination:  Vitals:  Wt Readings from Last 3 Encounters:  12/28/23 182 lb 4 oz (82.7 kg)  09/24/23 172 lb 12.8 oz (78.4 kg)  06/16/23 176 lb 9.6 oz (80.1 kg)   BP Readings from Last 3 Encounters:  12/28/23 130/66  09/24/23 128/76  06/16/23 122/78   Pulse Readings from Last 3 Encounters:  12/28/23 68  09/24/23 74  06/16/23 73    Labs:?  Lab Results  Component Value Date   HGBA1C 8.7 (A) 12/28/2023   HGBA1C 10.3 (A) 09/24/2023   HGBA1C 11.6 (H) 06/16/2023   GLUCOSE 58 (L) 01/06/2024   MICRALBCREAT Unable to calculate 12/28/2023   MICRALBCREAT 1.6 11/16/2022   MICRALBCREAT 5.4 11/11/2015   CREATININE 1.22 01/06/2024   CREATININE 1.17 06/16/2023   CREATININE 1.31 11/16/2022   GFR 62.46 01/06/2024   GFR 65.94 06/16/2023   GFR 57.81 (L) 11/16/2022    Lab Results  Component  Value Date   CHOL 83 01/06/2024   LDLCALC 42 01/06/2024   LDLCALC 43 06/16/2023   LDLCALC 64 01/12/2022   LDLDIRECT 58.0 09/13/2017   HDL 30.40 (L) 01/06/2024   TRIG 50.0 01/06/2024   TRIG 112.0 06/16/2023   TRIG 63.0 01/12/2022   ALT 15 01/06/2024   ALT 18 06/16/2023   AST 19 01/06/2024   AST 15 06/16/2023      Chemistry      Component Value Date/Time   NA 141 01/06/2024 0936   K 4.6 01/06/2024 0936   CL 108 01/06/2024 0936   CO2 24 01/06/2024 0936   BUN 18 01/06/2024 0936   CREATININE 1.22 01/06/2024 0936   CREATININE 1.17 06/28/2020 1515      Component Value Date/Time   CALCIUM  9.2 01/06/2024 0936   ALKPHOS 84 01/06/2024 0936   AST 19 01/06/2024 0936   ALT 15 01/06/2024 0936   BILITOT 0.6 01/06/2024 0936     The ASCVD Risk score (Arnett DK, et al., 2019) failed to calculate for the following reasons:   The valid total cholesterol range is 130 to 320 mg/dL  Assessment and Plan:  1. Diabetes, type 2: uncontrolled - A1c 8.7% (12/28/23), decreased from 10.3% (09/24/23). Goal <7% w/o hypoglycemia. Short term goal A1c <8.5% for shoulder surgery 03/16/24. Notably, is taking samples of Farxiga  and Ozempic  which are not affordable through insurance. All brand medications currently cost-prohibitive. Plans to enroll in Medicare this month.  Basal reduction needed per consistent overnight lows. Will continue glimepiride  for now given lows are concentrated to night and Farxciga/Ozempic  may or may not be feasible to continue though discussed ideally stopping in coming weeks, or at PCP f/u if having any mid-day lows.  CGM data: significant improvement with medication compliance/dietary modification. Over the past 7 days, significant increase in nighttime lows.  Current Regimen: Farxiga  5 mg/d (samples), Ozempic  0.25 mg/w (sample), Basagl;ar 60 u/d, Novolog  15u b4 meal (taking ~once per day) Decrease basaglar  55 units daily (~10% dec).  If continued overnight lows after 3-4 days, further  reduction to 50 units daily  Reviewed s/sx/tx hypoglycemia  Future Consideration (once Medicare coverage): Increase Farxiga  to 10 mg daily (or sooner if samples).  GLP1 titration to max tolerated - monitor BMI, currently WNL non-obese. Basal insulin  > concentrated for better absorption (Tresiba  U200 or Toujeo  U300) Tresiba  U200: Copay Card =  as little as $35 per 30-day supply (with a maximum of $65 per 30-day supply) or no more than $99 per prescription.    2. HTN: controlled historically based on last clinic BP of 120s/70s though last office visit 130/66 mmHg (12/28/23). Goal <130/80 mmHg. Denies lightheadedness, dizziness, SOB, CP, vision changes. Propranolol  likely providing minimal BP-reduction. No other compelling indications. Patient feels historically has not impacted migraines. Generally, taper off beta blocker, though currently on lowest dose once daily. Reasonable to discontinue. To discuss at upcoming PCP visit this week.  Current Regimen: amlodipine  5 mg/d, lisinopril  40 mg/d, propranolol  ER 60 mg/d Continue medications without changes. Discuss discontinuation Inderal  w PCP. Monitor BP/migraine.  Future Consideration: Amlodipine : Room for titration to 10 mg daily if needed. Currently BP well-controlled.    3. ASCVD (primary prevention): LDL at goal on last lipid panel - LDL 42 mg/dL, TG 50 mg/dL (09/13/99). LDL goal <70 mg/dL (primary prevention, diabetes). Has been taking daily aspirin for the past 2-3 years. Discussed risk/benefit. Positive for hx GIB though notes 2/2 to hemorrhoids. Has had colonoscopy/GI evaluation. Continues to experience BRBPR occasionally. New Rx for meloxicam for shoulder pain. Recommend stopping asa at least while on NSAID given modifiable risk factors are well-controlled and ASA not technically indicated.  Key risk factors include: diabetes, hypertension (well-controlled), and hyperlipidemia (well-controlled). Significant fam hx premature ASCVD (father,  grandfather MI <50) Current Regimen: atorvastatin  10 mg daily Discussed consideration of stopping asa, not indicated for primary prevention. At least hold while using Meloxicam scheduled.    4. Healthcare Maintenance:  Pneumococcal - Current status: Due - PCV20 Shingles - Current status: Due - no record Influenza - Current status: Overdue.   Due to receive the following vaccines: Shingrix and PCV20   5. Medication Access/Cost Brand medications through current insurance remain too expensive 2/2 high deductible. Not a feasible option at this time, even with copay cards. Medicare D will offer better coverage. Emphasized importance of factoring drug cost into plan selection.  Plans to speak w insurance agent regarding medicare options Provided patient with Medicare SHIIP contact information for medicare plan review.   Follow Up Phone check-in Friday to assess ongoing hypoglycemia Follow up with clinical pharmacist via phone visit between PCP visits. ~2-4 weeks while adjusting insulins.  Future Appointments  Date Time Provider Department Center  01/12/2024  2:40 PM Valli Gaw, MD [facility]  03/01/2024  3:00 PM Bair, Randa Burton, MD [name]    Daron Ellen, PharmD Clinical Pharmacist [facility] [phone]

## 2024-01-12 ENCOUNTER — Telehealth: Payer: Self-pay | Admitting: Family Medicine

## 2024-01-12 ENCOUNTER — Telehealth: Payer: Self-pay

## 2024-01-12 ENCOUNTER — Ambulatory Visit: Admitting: Family Medicine

## 2024-01-12 ENCOUNTER — Ambulatory Visit: Admitting: Internal Medicine

## 2024-01-12 ENCOUNTER — Other Ambulatory Visit (HOSPITAL_COMMUNITY): Payer: Self-pay

## 2024-01-12 NOTE — Telephone Encounter (Signed)
*  Primary  Pharmacy Patient Advocate Encounter   Received notification from Pt Calls Messages that prior authorization for Farxiga   is required/requested.   Insurance verification completed.   The patient is insured through [facility] .   Per test claim: The current 90 day co-pay is, $588.24.  No PA needed at this time. This test claim was processed through [facility]- copay amounts may vary at other pharmacies due to pharmacy/plan contracts, or as the patient moves through the different stages of their insurance plan.

## 2024-01-12 NOTE — Telephone Encounter (Signed)
*  Primary  Pharmacy Patient Advocate Encounter   Received notification from Pt Calls Messages that prior authorization for FreeStyle Libre 3 Plus Sensor  is required/requested.   Insurance verification completed.   The patient is insured through [facility] .   Per test claim: PA required; PA submitted to above mentioned insurance via CoverMyMeds Key/confirmation #/[id number] Status is pending

## 2024-01-12 NOTE — Telephone Encounter (Signed)
*  Primary  Pharmacy Patient Advocate Encounter   Received notification from Pt Calls Messages that prior authorization for Ozempic  is required/requested.   Insurance verification completed.   The patient is insured through [facility] .   Per test claim: The current 28 day co-pay is, $977.83.  No PA needed at this time. This test claim was processed through [facility]- copay amounts may vary at other pharmacies due to pharmacy/plan contracts, or as the patient moves through the different stages of their insurance plan.

## 2024-01-12 NOTE — Telephone Encounter (Signed)
 Patient was suppose to have an appointment today with Dr Sueanne Emerald, 01/12/2024, to discuss samples that were given to him to try. He will run out tomorrow. Patient stated he cleared his schedule for today and will not be able to come in at all this week, working out of area. Please call patient today. Needs samples.

## 2024-01-12 NOTE — Telephone Encounter (Signed)
 Pharmacy Patient Advocate Encounter  Received notification from [facility] that Prior Authorization for FreeStyle Libre 3 Plus Sensor  has been APPROVED from 01/12/2024 to 01/11/2025. Ran test claim, Copay is $68.14. This test claim was processed through [facility]- copay amounts may vary at other pharmacies due to pharmacy/plan contracts, or as the patient moves through the different stages of their insurance plan.

## 2024-01-12 NOTE — Telephone Encounter (Signed)
 PA request has been Submitted. New Encounter has been or will be created for follow up. For additional info see Pharmacy Prior Auth telephone encounter from 05/07.

## 2024-01-12 NOTE — Telephone Encounter (Signed)
 Pa needed fpr libre 3 sensors, ozempic  and farxiga .

## 2024-01-13 NOTE — Telephone Encounter (Signed)
 Called pt and he stated the the Ozempic  is 900 dollars and the [place] was 400 something and he could not afford it. I am giving him a sample of the [place] 3.

## 2024-01-18 DIAGNOSIS — M25511 Pain in right shoulder: Secondary | ICD-10-CM | POA: Insufficient documentation

## 2024-01-18 DIAGNOSIS — M249 Joint derangement, unspecified: Secondary | ICD-10-CM | POA: Insufficient documentation

## 2024-01-18 DIAGNOSIS — M24811 Other specific joint derangements of right shoulder, not elsewhere classified: Secondary | ICD-10-CM | POA: Diagnosis not present

## 2024-01-21 ENCOUNTER — Encounter: Payer: Self-pay | Admitting: Family Medicine

## 2024-01-25 ENCOUNTER — Other Ambulatory Visit (INDEPENDENT_AMBULATORY_CARE_PROVIDER_SITE_OTHER): Payer: Self-pay | Admitting: Pharmacist

## 2024-01-25 DIAGNOSIS — E118 Type 2 diabetes mellitus with unspecified complications: Secondary | ICD-10-CM

## 2024-01-25 NOTE — Patient Instructions (Addendum)
 [name],   It was a pleasure to speak with you today! As we discussed:?   Stop glimepiride  In the future, we can refill this if we feel it is needed. At this time, your sugars are good/low even without the glimepiride . I will remove this from your medication list for now.  Decrease basaglar  insulin  from 55 unit to 50 units once daily.  Continue your other medications as you have been taking them - Ozempic , metformin , Humalog  15 units prior to meals only.   We discussed providing your Medicare contact with your medicine list to ensure that all brand medicines (insulin  and non-insulin ) are covered well and will be affordable through your plan.  I recommend asking exactly what your cost will be for each of your brand medicines with your Medicare plan before confirming your plan.  Ozempic  Farxiga  or Jardiance  Libre or Dexcom (blood sugar sensors)  I also recommend writing down the name of the Medicare plan that is recommended to you by your Medicare contact. I am happy to also review the summary of prescription benefits prior to you locking in to the plan.   -.-.-.-.-.-.-.-.-.-.-.-.-.-.-.-.-.-.-.-.-.-.-.-.-. Previously we discussed the following:  MEDICARE  [employer] provides access to all Medicare patient to a Medicare specialist Engineer, production Information Program [facility]) Coordinator). This is a FREE and non-biased service for all [place]  citizens. I recommend speaking with them with a list of your medications as they can provide some insight into different Medicare plans based on your needs.  [employer]  Presenter, broadcasting Information Program [facility]) Coordinator Idella Major [facility] [address]     [place]  [phone] (Ask for [facility] help)   There are also some plans that have specific benefits for patients with heart or diabetes needs.   An example we see here  commonly is [employer] Diabetes and Heart care  Consider reviewing benefit of plans that are tailored to you, your diseases, and the medicines you are prescribed.   I would make sure to get a good understanding of what a brand-name medicine will cost through your Medicare plan (such as Ozempic , Jardiance , etc.).  'Copay' vs 'Coinsurance' for prescription drugs Some plans have a set copay (example, after paying your deductible, they may charge a flat rate the remainder of the year which would be the same amount for all preferred brand drugs. For example, Ozempic  $47/month,Jardiance  $47/month.  Other medicare plans charge "coinsurance". Instead of a flat copay for brand medications, they charge a percentage of the drug cost. This leaves brand medicines unaffordable for most patients. I recommend proceeding cautiously with plans that have a "coinsurance" for prescription medications.  Example: If Ozempic  is a $2000 without insurance, a 25% coinsurance would be $500/month.   Please reach out prior to your next scheduled appointment should you have any questions or concerns.  You may respond directly to this message, or leave me a voicemail at [phone] and I will get back to you shortly.   Thank you!   Future Appointments  Date Time Provider Department Center  03/01/2024  3:00 PM Bair, Randa Burton, MD [name]    Daron Ellen, PharmD Clinical Pharmacist [facility] [phone]

## 2024-01-25 NOTE — Progress Notes (Signed)
 01/25/2024 Name: Jonathan West MRN: [id number] DOB: 1961/06/02  Subjective  Chief Complaint  Patient presents with   Diabetes    Reason for visit: ?  Jonathan West is a 63 y.o. male with a history of diabetes (type 2), who presents today for a follow up diabetes pharmacotherapy visit.? They were referred by with PCP for medication management related to Diabetes, HTN, HLD.  Pertinent PMH includes HTN, T2DM, HLD, diabetic neuropathy, ED.  Known DM Complications: autonomic neuropathy (ED), peripheral neuropathy, retinopathy  Care Team: Primary Care Provider: Dr. Valli West (transition of care to Jonathan West upcoming)   Date of Last Diabetes Related Visit: with PCP on 12/28/23 and with Pharmacist on 01/10/24  Recent Summary of Change: 5/5: ??basaglar  to 55 u/d (10%) 4/22: Start Farxiga  5 mg/d, Start Ozempic  0.25 mg/week   Medication Access/Adherence: Prescription drug coverage: Payor: BLUE CROSS BLUE SHIELD / Plan: BCBS COMM PPO / Product Type: *No Product type* / .  - Confirms he has been in contact with his Medicare contact to discuss plans. Turns 63 this week and plans to enroll in Medicare. Has provided active list of his meds to the Medicare person including Farxiga  and Ozempic .   Since Last visit / History of Present Illness: ?  Patient reports implementing plan from last visit. Confirms basaglar  reduction as instructed. Denies self-adjustment of basal or prandial insulin  doses. Confirms skipping Novolog  if not eating meals.   Feels glycemic control has been complicated by inconsistent eating cycles (works and is on the road often).   Shoulder surgery scheduled for July 10th, though notes surgeon will not operate if A1c is high. Goal for surgery 8.5%. Recently at work, stumbled into a shelf and injured his good shoulder, MRI scheduled this week.   Reported DM Regimen: ?   (ran out of sample ~1 week ago)  (refill request was not approved, ran out 2 weeks ago) Metformin  XR 2000  mg daily with breakfast Ozempic  0.25 mg weekly (4th injection given this week) Basaglar  (glargine) 55 units daily (bedtime)  Humalog  15 units TIDAC    DM medications tried in the past:?  Metformin  IR (stomach upset, tolerated XR well) Jardiance  (stopped due to cost, tolerated well)  SMBG: ?Libre 3 (samples given in clinic - cost at pharmacy ~$74/month) CGM Report (Date Range 5/7 - 5/20) ABG 125 mg/dL; GMI 8.6%; Variability 38.2%; TIR 72%, high 14%, very high 1%, low 12%, very low 1%  CGM Report (Date Range 4/23 - 5/6) ABG 147 mg/dL; GMI 5.7%; Variability 48.4%; TIR 63%, high 14%, very high 12%, low 11%, very low 0%    Hypo/Hyperglycemia: ?  Symptoms of hypoglycemia since last visit:? yes  Some during the night (does have symptoms).  Has been seeing more mid-day lows despite running out of glimepiride  2 weeks ago.  If yes, it was treated by: juice  Symptoms of hyperglycemia since last visit:?No   Reported Diet: Patient typically eats 1-2 meals per day (confirms Humalog  use only prior to meals 1-2 times daily)   - 1 day per week: Has 3 meals  - 3 days per week: Has 1 meal (dinner)  - 3 days per week: Has 2 meals (lunch + dinner) Beverages: Used to drink diet soda 90% of the time. Now, drinks only water. With dinner, one diet drink. No more sweet tea.   DM Prevention:  Statin: Taking; moderate intensity.?  History of chronic kidney disease? no History of albuminuria? yes, (54 mg/g 2017), last UACR on  12/28/23 = too low to calculate ACE/ARB - Taking lisinorpil 40 mg daily; Urine MA/CR Ratio - normal.  Last eye exam: 08/2018; Retinopathy Present Last foot exam: 11/16/2022 Tobacco Use: Never smoker Immunizations:? Flu: Due (Last: 06/28/2020); Pneumococcal: No Record - DUE; Shingrix: No Record - DUE   Cardiovascular Risk Reduction History of clinical ASCVD? no History of heart failure? no  History of hyperlipidemia? yes Current BMI: 26.1 kg/m2 (Ht 70 in, Wt 82.7 kg) Taking  statin? yes; moderate intensity (atorvastatin  10) Taking aspirin? unclear if indicated; Taking   Taking SGLT-2i? yes (medication sample) Taking GLP- 1 RA? yes (medication sample)   Reported HTN Regimen: ?  Lisinopril  40 mg daily Amlodipine  5 mg daily  Propranolol  ER 60 mg daily Self-discontinued 2015 - reported no change to BP with stopping.  07/2015: Re-started ("Pt reports his BP is creeping upwards, was on propranolol  in past with good results. He does have migraines and this maybe helpful for both.")   Patient reports severe migraines in childhood resulting in Na/vomiting. None in the past 5 years with vomiting. Minimal c/f headaches the past 2-3 years. Infrequent mild headache resolved with OTC medication. Notes he never noticed a difference on vs off of propranolol  for his migraines.   Patient denies hypotensive s/sx. No dizziness, lightheadedness.  Patient denies hypertensive symptoms. No headache, chest pain, shortness of breath, visual changes.     _______________________________________________  Objective    Review of Systems:? Limited in the setting of virtual visit  Constitutional:? No fever, chills or unintentional weight loss  Cardiovascular:? No chest pain or pressure, shortness of breath, dyspnea on exertion, orthopnea or LE edema  GI:? No nausea, vomiting, constipation, diarrhea, abdominal pain, dyspepsia, change in bowel habits  Endocrine:? No polyuria, polyphagia or blurred vision    Physical Examination:  Vitals:  Wt Readings from Last 3 Encounters:  12/28/23 182 lb 4 oz (82.7 kg)  09/24/23 172 lb 12.8 oz (78.4 kg)  06/16/23 176 lb 9.6 oz (80.1 kg)   BP Readings from Last 3 Encounters:  12/28/23 130/66  09/24/23 128/76  06/16/23 122/78   Pulse Readings from Last 3 Encounters:  12/28/23 68  09/24/23 74  06/16/23 73    Labs:?  Lab Results  Component Value Date   HGBA1C 8.7 (A) 12/28/2023   HGBA1C 10.3 (A) 09/24/2023   HGBA1C 11.6 (H) 06/16/2023    GLUCOSE 58 (L) 01/06/2024   MICRALBCREAT Unable to calculate 12/28/2023   MICRALBCREAT 1.6 11/16/2022   MICRALBCREAT 5.4 11/11/2015   CREATININE 1.22 01/06/2024   CREATININE 1.17 06/16/2023   CREATININE 1.31 11/16/2022   GFR 62.46 01/06/2024   GFR 65.94 06/16/2023   GFR 57.81 (L) 11/16/2022    Lab Results  Component Value Date   CHOL 83 01/06/2024   LDLCALC 42 01/06/2024   LDLCALC 43 06/16/2023   LDLCALC 64 01/12/2022   LDLDIRECT 58.0 09/13/2017   HDL 30.40 (L) 01/06/2024   TRIG 50.0 01/06/2024   TRIG 112.0 06/16/2023   TRIG 63.0 01/12/2022   ALT 15 01/06/2024   ALT 18 06/16/2023   AST 19 01/06/2024   AST 15 06/16/2023      Chemistry      Component Value Date/Time   NA 141 01/06/2024 0936   K 4.6 01/06/2024 0936   CL 108 01/06/2024 0936   CO2 24 01/06/2024 0936   BUN 18 01/06/2024 0936   CREATININE 1.22 01/06/2024 0936   CREATININE 1.17 06/28/2020 1515      Component Value Date/Time  CALCIUM  9.2 01/06/2024 0936   ALKPHOS 84 01/06/2024 0936   AST 19 01/06/2024 0936   ALT 15 01/06/2024 0936   BILITOT 0.6 01/06/2024 0936     The ASCVD Risk score (Arnett DK, et al., 2019) failed to calculate for the following reasons:   The valid total cholesterol range is 130 to 320 mg/dL  Assessment and Plan:   1. Diabetes, type 2: uncontrolled - A1c 8.7% (12/28/23), decreased from 10.3% (09/24/23). Goal <7% w/o hypoglycemia. Short-term goal <8.5% for shoulder surgery 03/16/24. Ran out of glimepiride  2 weeks ago, Farxiga  1 week ago (sample). Several Ozempic  0.25 mg injections remaining.  Ongoing hypoglycemia despite de-escalation of regimen recently. This is secondary to variable compliance previously with insulin . Likely lower insulin  requirement than previously suspected due to compliance. Continue to hold glimepiride  per good control the past 2 weeks off of it w increase in lows. PPBG return back to pre-meal level typically within 2 hours. Humalog  dose is effective. Pattern of  post-prandial hypoglcyemia not observed on CGM. Basal reduction should be sufficient in reducing lows.   We again discussed Medicare eligibility and importance of selecting a plan that will offer Rx coverage for brand medications at an affordable copay as to allow continuation of Ozempic , Farxiga , CGM which will promote insulin  reduction.  With Ozempic  titration, likely will be able to come off of Novolog .   CGM data: significant improvement in overall glucemic control with medication compliance/dietary modification though significant increase in lows.  Current Regimen: Farxiga  5 mg/d (samples - ran out 1 week ago), Ozempic  0.25 mg/w (sample, ~2-4 wk remaining), Basaglar  55 u/d, Novolog  15u b4 meal (taking ~once per day) Stop glimepiride   Decrease basaglar  to 50 units daily (9%??)  Continue metformin , Novolog   Reviewed s/sx/tx hypoglycemia  Future Consideration (once Medicare coverage): Resume SGLT2i upon enrollment in Medicare (cost-prohibitive at current time) GLP1 titration to max tolerated - monitor BMI, currently WNL non-obese. Basal insulin  > concentrated for better absorption if still requiring high basal dose (Tresiba  U200 or Toujeo  U300) Tresiba  U200: Copay Card =  as little as $35 per 30-day supply (with a maximum of $65 per 30-day supply) or no more than $99 per prescription. Ideally, reduction of Novolog  with GLP1 titration as able   Healthcare Maintenance:  Pneumococcal - Current status: Due - PCV20 Shingles - Current status: Due - no record Influenza - Current status: Overdue.   Due to receive the following vaccines: Shingrix and PCV20   Follow Up Phone check-in 1 week to assess ongoing hypoglycemia / Medicare progress May PCP visit canceled. Next scheduled mid-June. Patient reports he will be out of town - Msg sent to PCP pool.   Future Appointments  Date Time Provider Department Center  03/01/2024  3:00 PM Bair, Randa Burton, MD [name]    Daron Ellen,  PharmD Clinical Pharmacist [facility] [phone]

## 2024-01-26 DIAGNOSIS — M25511 Pain in right shoulder: Secondary | ICD-10-CM | POA: Diagnosis not present

## 2024-01-28 DIAGNOSIS — S46011A Strain of muscle(s) and tendon(s) of the rotator cuff of right shoulder, initial encounter: Secondary | ICD-10-CM | POA: Diagnosis not present

## 2024-01-31 ENCOUNTER — Other Ambulatory Visit: Payer: Self-pay | Admitting: Family Medicine

## 2024-01-31 DIAGNOSIS — E1159 Type 2 diabetes mellitus with other circulatory complications: Secondary | ICD-10-CM

## 2024-02-01 ENCOUNTER — Other Ambulatory Visit (INDEPENDENT_AMBULATORY_CARE_PROVIDER_SITE_OTHER): Payer: Self-pay | Admitting: Pharmacist

## 2024-02-01 DIAGNOSIS — I10 Essential (primary) hypertension: Secondary | ICD-10-CM

## 2024-02-01 DIAGNOSIS — G629 Polyneuropathy, unspecified: Secondary | ICD-10-CM

## 2024-02-01 DIAGNOSIS — E118 Type 2 diabetes mellitus with unspecified complications: Secondary | ICD-10-CM

## 2024-02-01 DIAGNOSIS — M5416 Radiculopathy, lumbar region: Secondary | ICD-10-CM

## 2024-02-01 NOTE — Progress Notes (Unsigned)
 Brief Telephone Documentation Reason for Call: Brief [facility] check-in/insulin  adjustment   Summary of Call: Called patient 5/27 to assess recent medication changes on 01/25/24. 5/20: Basaglar  reduced to 50 units daily (10%)  Low sugars have improved significantly though have not fully resolved. Continues to have intermittent readings in the upper 60s either fasting, or mid-afternoon. Confirms adherence to 50 units of basaglar  daily for the previous 7 days as instructed.   Reduce basaglar  to 46 units May further reduce basaglar  by 2 units every 3-4 days for ongoing fasting lows. Patient verbalizes understanding. ~Friday or Sat will decrease to 44 units if still having lows.   Follow Up: Patient given direct line for further questions/concerns. PCP pool messaged. Patient has conflict with current date of scheduled TOC visit. Out of town 6/23-27  Future Appointments  Date Time Provider Department Center  03/01/2024  3:00 PM Bair, Randa Burton, MD [name]    Daron Ellen, PharmD Clinical Pharmacist [facility] [phone]

## 2024-02-02 MED ORDER — AMLODIPINE BESYLATE 5 MG PO TABS: 5.0000 mg | ORAL_TABLET | Freq: Every day | ORAL | 0 refills | Status: DC

## 2024-02-02 MED ORDER — LISINOPRIL 20 MG PO TABS: 40.0000 mg | ORAL_TABLET | Freq: Every day | ORAL | 0 refills | Status: DC

## 2024-02-02 MED ORDER — GABAPENTIN 300 MG PO CAPS: ORAL_CAPSULE | ORAL | 0 refills | Status: DC

## 2024-02-11 DIAGNOSIS — M75112 Incomplete rotator cuff tear or rupture of left shoulder, not specified as traumatic: Secondary | ICD-10-CM | POA: Diagnosis not present

## 2024-02-11 DIAGNOSIS — S46011A Strain of muscle(s) and tendon(s) of the rotator cuff of right shoulder, initial encounter: Secondary | ICD-10-CM | POA: Diagnosis not present

## 2024-02-14 ENCOUNTER — Encounter: Payer: Self-pay | Admitting: Internal Medicine

## 2024-02-14 ENCOUNTER — Ambulatory Visit (INDEPENDENT_AMBULATORY_CARE_PROVIDER_SITE_OTHER): Admitting: Internal Medicine

## 2024-02-14 ENCOUNTER — Encounter: Payer: Self-pay | Admitting: *Deleted

## 2024-02-14 VITALS — BP 132/78 | HR 85 | Temp 97.8°F | Ht 70.0 in | Wt 178.6 lb

## 2024-02-14 DIAGNOSIS — Z7984 Long term (current) use of oral hypoglycemic drugs: Secondary | ICD-10-CM | POA: Diagnosis not present

## 2024-02-14 DIAGNOSIS — E1159 Type 2 diabetes mellitus with other circulatory complications: Secondary | ICD-10-CM

## 2024-02-14 DIAGNOSIS — E118 Type 2 diabetes mellitus with unspecified complications: Secondary | ICD-10-CM | POA: Diagnosis not present

## 2024-02-14 DIAGNOSIS — I152 Hypertension secondary to endocrine disorders: Secondary | ICD-10-CM

## 2024-02-14 LAB — POCT GLYCOSYLATED HEMOGLOBIN (HGB A1C): Hemoglobin A1C: 7.3 % — AB (ref 4.0–5.6)

## 2024-02-14 MED ORDER — SEMAGLUTIDE(0.25 OR 0.5MG/DOS) 2 MG/3ML ~~LOC~~ SOPN: 0.5000 mg | PEN_INJECTOR | SUBCUTANEOUS | Status: DC

## 2024-02-14 NOTE — Progress Notes (Signed)
 Acute Office Visit  Subjective:     Patient ID: Jonathan West, male    DOB: 07/24/1961, 63 y.o.   MRN: [id number]  Chief Complaint  Patient presents with   Acute Visit    POC A1c    HPI Patient is in today for follow-up of his diabetes and hypertension.  Patient's last A1c was 8.7 in April and he needs it to be less than 8.5 prior to his rotator cuff surgery.  He states that he has modified his diet and has been taking the sample of the Ozempic  in addition to his metformin  and long and short acting insulin .  He was also on a sample of Farxiga  but ran out of this medication  Patient has a history of hypertension that is well-controlled on his current regimen.  Denies any complaints at this time  Review of Systems  Constitutional: Negative.   HENT: Negative.    Respiratory: Negative.    Cardiovascular: Negative.   Musculoskeletal: Negative.   Neurological: Negative.   Psychiatric/Behavioral: Negative.          Objective:     BP 132/78   Pulse 85   Temp 97.8 F (36.6 C)   Ht 5\' 10"  (1.778 m)   Wt 178 lb 9.6 oz (81 kg)   SpO2 97%   BMI 25.63 kg/m    Physical Exam Constitutional:      Appearance: Normal appearance.  HENT:     Head: Normocephalic and atraumatic.  Cardiovascular:     Rate and Rhythm: Normal rate and regular rhythm.     Heart sounds: Normal heart sounds.  Pulmonary:     Effort: No respiratory distress.     Breath sounds: Normal breath sounds. No wheezing or rales.  Abdominal:     General: Bowel sounds are normal. There is no distension.     Palpations: Abdomen is soft.     Tenderness: There is no abdominal tenderness.  Musculoskeletal:        General: No swelling or tenderness.  Neurological:     General: No focal deficit present.     Mental Status: He is alert and oriented to person, place, and time.  Psychiatric:        Mood and Affect: Mood normal.        Behavior: Behavior normal.     Results for orders placed or performed in  visit on 02/14/24  POCT glycosylated hemoglobin (Hb A1C)  Result Value Ref Range   Hemoglobin A1C 7.3 (A) 4.0 - 5.6 %   HbA1c POC (<> result, manual entry)     HbA1c, POC (prediabetic range)     HbA1c, POC (controlled diabetic range)          Assessment & Plan:   Problem List Items Addressed This Visit       Cardiovascular and Mediastinum   Hypertension associated with diabetes (HCC)   - This problem is chronic and stable -Patient blood pressure is 132/78 today -Will continue with amlodipine , propranolol  and lisinopril  -No further workup at this time        Endocrine   Type 2 diabetes mellitus with complications (HCC) - Primary   - This problem is chronic and improving -Patient's A1c has improved to 7.3 today -Will continue with Basaglar  55 units (patient had reduced to 46 but notices blood sugars had gone up and went back to 55), metformin  XR 1000 mg twice daily as well as Humalog  15 units Premeal -Patient instructed to reduce his  Basaglar  down to 46 again if he notices blood sugars are going low -Patient is also on a sample of Ozempic  0.25 mg weekly.  We will give him another sample of Ozempic  today but increase the dose to 0.5 mg weekly -Patient also was on a sample of Farxiga  5 mg daily.  We will give him another sample of this medication today -Patient will need referral to pharmacy to help with medication cost assistance as Ozempic  and Farxiga  are cost prohibitive currently as he has not met his deductible -No further workup at this time      Relevant Orders   POCT glycosylated hemoglobin (Hb A1C) (Completed)    No orders of the defined types were placed in this encounter.   No follow-ups on file.  Ruqayya Ventress, MD

## 2024-02-14 NOTE — Progress Notes (Signed)
 Medication Samples have been provided to the patient.  Drug name: Farxiga         Strength: 5 mg        Qty: I box 7 tablets  LOT: [id number]  Exp.Date: 04/06/2026  Dosing instructions: Take 1 tablet by mouth daily  The patient has been instructed regarding the correct time, dose, and frequency of taking this medication, including desired effects and most common side effects.   Shellee Devonshire 11:50 AM 02/14/2024   Medication Samples have been provided to the patient.  Drug name: Ozempic        Strength: 0.5 mg        Qty: I pen  LOT: [id number]  Exp.Date: 07/07/2026  Dosing instructions: Inject 0.5 mg into the skin once a week  The patient has been instructed regarding the correct time, dose, and frequency of taking this medication, including desired effects and most common side effects.   Shellee Devonshire 11:52 AM 02/14/2024

## 2024-02-14 NOTE — Assessment & Plan Note (Signed)
-   This problem is chronic and improving -Patient's A1c has improved to 7.3 today -Will continue with Basaglar  55 units (patient had reduced to 46 but notices blood sugars had gone up and went back to 55), metformin  XR 1000 mg twice daily as well as Humalog  15 units Premeal -Patient instructed to reduce his Basaglar  down to 46 again if he notices blood sugars are going low -Patient is also on a sample of Ozempic  0.25 mg weekly.  We will give him another sample of Ozempic  today but increase the dose to 0.5 mg weekly -Patient also was on a sample of Farxiga  5 mg daily.  We will give him another sample of this medication today -Patient will need referral to pharmacy to help with medication cost assistance as Ozempic  and Farxiga  are cost prohibitive currently as he has not met his deductible -No further workup at this time

## 2024-02-14 NOTE — Patient Instructions (Addendum)
-   It was a pleasure meeting you today -Your A1c has now improved to 7.3.  Keep up the great work! - We will give you samples for the Ozempic  and the Farxiga  today.  You will need to hold Ozempic  the week prior to surgery and the Farxiga  72 hours before surgery -I will also put in referral to pharmacy for you to see if they can get you into medication assistance program to help with these medications -Continue with your metformin , Basaglar  and Premeal Humalog  insulin .  You notice your blood sugars are going low please decrease the Basaglar  down to 46 units again  -Your blood pressure is well-controlled today.  Continue with your propranolol , amlodipine  and lisinopril  -Please contact us  with any questions or concerns

## 2024-02-14 NOTE — Assessment & Plan Note (Signed)
-   This problem is chronic and stable -Patient blood pressure is 132/78 today -Will continue with amlodipine , propranolol  and lisinopril  -No further workup at this time

## 2024-02-16 ENCOUNTER — Telehealth: Payer: Self-pay | Admitting: *Deleted

## 2024-02-16 NOTE — Progress Notes (Signed)
 Care Guide Pharmacy Note  02/16/2024 Name: Jonathan West MRN: [id number] DOB: 1961/08/11  Referred By: No primary care provider on file. Reason for referral: Complex Care Management and Call Attempt #1 (Outreach to schedule referral with pharmacist )   Jonathan West is a 63 y.o. year old male who is a primary care patient of No primary care provider on file.Jonathan West was referred to the pharmacist for assistance related to: DMII  An unsuccessful telephone outreach was attempted today to contact the patient who was referred to the pharmacy team for assistance with medication assistance. Additional attempts will be made to contact the patient.  Kandis Ormond, CMA [place]  [facility], [facility] Guide Direct Dial : [phone]  Fax: [phone] Website: [email or web address]

## 2024-02-17 NOTE — Progress Notes (Signed)
 Care Guide Pharmacy Note  02/17/2024 Name: [name] MRN: [id number] DOB: Jun 19, 1961  Referred By: No primary care provider on file. Reason for referral: Complex Care Management and Call Attempt #1 (Outreach to schedule referral with pharmacist )   Jonathan West is a 63 y.o. year old male who is a primary care patient of No primary care provider on file.Jonathan West was referred to the pharmacist for assistance related to: DMII  A second unsuccessful telephone outreach was attempted today to contact the patient who was referred to the pharmacy team for assistance with medication assistance. Additional attempts will be made to contact the patient.  Kandis Ormond, CMA [place]  [facility], [facility] Guide Direct Dial : [phone]  Fax: [phone] Website: [email or web address]

## 2024-02-18 NOTE — Progress Notes (Signed)
 Care Guide Pharmacy Note  02/18/2024 Name: Jonathan West MRN: [id number] DOB: Dec 16, 1960  Referred By: No primary care provider on file. Reason for referral: Complex Care Management and Call Attempt #1 (Outreach to schedule referral with pharmacist )   Jonathan West is a 63 y.o. year old male who is a primary care patient of No primary care provider on file.Jonathan West was referred to the pharmacist for assistance related to: DMII  A third unsuccessful telephone outreach was attempted today to contact the patient who was referred to the pharmacy team for assistance with medication assistance. The Population Health team is pleased to engage with this patient at any time in the future upon receipt of referral and should he/she be interested in assistance from the Population Health team.  Kandis Ormond, CMA [facility]  [facility], [facility] Care Guide Direct Dial : [phone]  Fax: [phone] Website: [email or web address]

## 2024-02-22 ENCOUNTER — Ambulatory Visit (INDEPENDENT_AMBULATORY_CARE_PROVIDER_SITE_OTHER)

## 2024-02-22 DIAGNOSIS — E1165 Type 2 diabetes mellitus with hyperglycemia: Secondary | ICD-10-CM

## 2024-02-22 DIAGNOSIS — E1159 Type 2 diabetes mellitus with other circulatory complications: Secondary | ICD-10-CM

## 2024-02-22 DIAGNOSIS — G629 Polyneuropathy, unspecified: Secondary | ICD-10-CM | POA: Insufficient documentation

## 2024-02-22 DIAGNOSIS — I83813 Varicose veins of bilateral lower extremities with pain: Secondary | ICD-10-CM | POA: Insufficient documentation

## 2024-02-22 DIAGNOSIS — E119 Type 2 diabetes mellitus without complications: Secondary | ICD-10-CM

## 2024-02-22 DIAGNOSIS — M5416 Radiculopathy, lumbar region: Secondary | ICD-10-CM

## 2024-02-22 DIAGNOSIS — Z794 Long term (current) use of insulin: Secondary | ICD-10-CM

## 2024-02-22 DIAGNOSIS — E782 Mixed hyperlipidemia: Secondary | ICD-10-CM | POA: Diagnosis not present

## 2024-02-22 DIAGNOSIS — Z23 Encounter for immunization: Secondary | ICD-10-CM

## 2024-02-22 DIAGNOSIS — I10 Essential (primary) hypertension: Secondary | ICD-10-CM | POA: Diagnosis not present

## 2024-02-22 DIAGNOSIS — E118 Type 2 diabetes mellitus with unspecified complications: Secondary | ICD-10-CM

## 2024-02-22 DIAGNOSIS — I152 Hypertension secondary to endocrine disorders: Secondary | ICD-10-CM

## 2024-02-22 DIAGNOSIS — E1140 Type 2 diabetes mellitus with diabetic neuropathy, unspecified: Secondary | ICD-10-CM

## 2024-02-22 DIAGNOSIS — G8929 Other chronic pain: Secondary | ICD-10-CM

## 2024-02-22 MED ORDER — OZEMPIC (0.25 OR 0.5 MG/DOSE) 2 MG/3ML ~~LOC~~ SOPN: 0.5000 mg | PEN_INJECTOR | SUBCUTANEOUS | Status: DC

## 2024-02-22 MED ORDER — OZEMPIC (0.25 OR 0.5 MG/DOSE) 2 MG/1.5ML ~~LOC~~ SOPN: 0.5000 mg | PEN_INJECTOR | SUBCUTANEOUS | 2 refills | Status: DC

## 2024-02-22 MED ORDER — PROPRANOLOL HCL ER 60 MG PO CP24: 60.0000 mg | ORAL_CAPSULE | Freq: Every day | ORAL | 1 refills | Status: DC

## 2024-02-22 MED ORDER — ATORVASTATIN CALCIUM 10 MG PO TABS: 10.0000 mg | ORAL_TABLET | Freq: Every day | ORAL | 3 refills | Status: AC

## 2024-02-22 MED ORDER — LISINOPRIL 20 MG PO TABS: 40.0000 mg | ORAL_TABLET | Freq: Every day | ORAL | 3 refills | Status: DC

## 2024-02-22 MED ORDER — BASAGLAR KWIKPEN 100 UNIT/ML ~~LOC~~ SOPN: PEN_INJECTOR | SUBCUTANEOUS | 1 refills | Status: DC

## 2024-02-22 MED ORDER — FREESTYLE LIBRE 3 SENSOR MISC: 6 refills | Status: DC

## 2024-02-22 MED ORDER — AMLODIPINE BESYLATE 5 MG PO TABS: 5.0000 mg | ORAL_TABLET | Freq: Every day | ORAL | 3 refills | Status: AC

## 2024-02-22 MED ORDER — METFORMIN HCL ER 500 MG PO TB24: 2000.0000 mg | ORAL_TABLET | Freq: Every day | ORAL | 3 refills | Status: AC

## 2024-02-22 MED ORDER — GABAPENTIN 300 MG PO CAPS
ORAL_CAPSULE | ORAL | 0 refills | Status: DC
Start: 2024-02-22 — End: 2024-05-22

## 2024-02-22 MED ORDER — RELION PEN NEEDLES 32G X 4 MM MISC: 1 refills | Status: AC

## 2024-02-22 NOTE — Assessment & Plan Note (Signed)
 Continue Atorvastatin  10 mg, LDL goal <70, within goal per lab from 01/06/24.

## 2024-02-22 NOTE — Assessment & Plan Note (Signed)
 Lifestyle Modifications: Advise on leg elevation, weight management, and regular exercise. Compression Therapy: Recommend compression stockings 10-15 mmHg.  Referral: Consider referral to a vascular specialist for further evaluation and management if symptoms persist or worsen. Reevaluate during follow up.

## 2024-02-22 NOTE — Patient Instructions (Addendum)
 If you are interested in the shingles vaccine series (Shingrix), call your insurance or pharmacy to check on coverage and location it must be given.  If affordable - you can schedule it here or at your pharmacy depending on coverage.   Please call My [facility] Dr. To schedule an eye appointment.   Please reach out to endocrinology department at [facility] to schedule an appointment. Please let us  know if you need referral to see them.   Wear compression stockings 10-15 mmHg pressure when you are at work. Raise legs, frequent walking can help with varicose veins as well.   We are discontinuing Humalog  insulin  and Farxiga  at this time. We will continue to monitor your HbA1c every 3 months.

## 2024-02-22 NOTE — Assessment & Plan Note (Signed)
-   Continue gabapentin  600 mg AM, 900 mg PM, refill sent. PDMP reviewed, appropriate.

## 2024-02-22 NOTE — Progress Notes (Signed)
 Medication Samples have been provided to the patient.  Drug name: Ozempic        Strength: 0.5     Qty: 1  LOT: [id number]  Exp.Date: 07-07-2026  Dosing instructions: Inject 0.5 mg into the skin once a week.   The patient has been instructed regarding the correct time, dose, and frequency of taking this medication, including desired effects and most common side effects.   Jonathan West 1:59 PM 02/22/2024

## 2024-02-22 NOTE — Progress Notes (Signed)
 Established Patient Office Visit TOC from Dr. Isom Marin    Subjective  Patient ID: Jonathan West, male    DOB: [birth year]  Age: 63 y.o. MRN: [id number]  Chief Complaint  Patient presents with   Establish Care   Transitions Of Care    He  has a past medical history of Complication of anesthesia, Diabetes mellitus without complication (HCC), History of anemia (08/11/2017), Hyperlipidemia, Hypertension, and Migraines.  HPI 1) Type II DM on insulin : - Patient is established with pharmacist as well. Last phone call with [facility]) was on 01/10/24 where basaglar  insulin  from 60 units to 55 units once daily.  - Seen by Dr. Sharia Daunt on 02/14/24: Was recommended to continue: Basaglar  at 46 units at night time.  Metformin  XR 2000 mg once a day, at the same time, mostly in the morning.  Humalog  15 units, 1-2 times a day. He does not eat three meals a day. He does not seem to be hungry.  Uses free style libre III for home BG monitoring. Total hypoglycemia events: 10 out of last 14 days.  Ozempic  0.5 mg weekly, (sample for 0.25 was given during this visit)  On Farxiga  5 mg daily (sample through our clinic) Home BG: Last A1c: 7.3% on 02/14/24 Last urine microalbumin: normal on 12/28/23 Eye exam: Has been over a year. [facility]  Due for pneumonia, shingles immunization On Atorvastatin  10 mg, last LDL 42 on 01/06/24 B 12 normal on 01/06/24, Takes B12  CMP from 01/06/24 showed low blood glucose of 58 and GFR: 62.46 Aspirin 81 mg daily  Has seen [facility] in the past but has not followed up with them. Reports diet could be improved.   2) Hypertension:  - On Amlodipine  5 mg, Lisinopril  20 mg. Also on Propanolol 60 mg daily. Does not check home blood pressure. No chest pain, lower leg edema.   5) Neuropathy b/l lower feet:  On Gabapentin  300 mg, 2 tab in AM, 3 tab in PM. Needs refill.   6) Last migraine headache episode 10 years ago. He does not recall getting migraine  headache secondary to anesthesia exposure.   7) B/L shoulder pain:  Follows up with Dr. Klifto at [facility] orthopedics.  Has complete tear of right shoulder on 03/16/24. Takes Meloxicam 7.5 mg daily.    ROS As per HPI    Objective:     BP 116/78   Pulse 90   Temp 98.3 F (36.8 C) (Oral)   Ht 5' 10 (1.778 m)   Wt 179 lb (81.2 kg)   SpO2 96%   BMI 25.68 kg/m      02/22/2024    1:16 PM 02/14/2024   11:34 AM 12/28/2023    2:12 PM  Depression screen PHQ 2/9  Decreased Interest 0 0 0  Down, Depressed, Hopeless 0 0 0  PHQ - 2 Score 0 0 0  Altered sleeping 0 0 0  Tired, decreased energy 0 0 0  Change in appetite 0 0 0  Feeling bad or failure about yourself  0 0 0  Trouble concentrating 0 0 0  Moving slowly or fidgety/restless 0 0 0  Suicidal thoughts 0 0 0  PHQ-9 Score 0 0 0  Difficult doing work/chores Not difficult at all Not difficult at all Not difficult at all      02/22/2024    1:16 PM 02/14/2024   11:34 AM 12/28/2023    2:12 PM 09/24/2023    9:24 AM  GAD 7 : Generalized Anxiety Score  Nervous, Anxious, on Edge 0 0 0 0  Control/stop worrying 0 0 0 0  Worry too much - different things 0 0 0 0  Trouble relaxing 0 0 0 0  Restless 0 0 0 0  Easily annoyed or irritable 0 0 0 0  Afraid - awful might happen 0 0 0 0  Total GAD 7 Score 0 0 0 0  Anxiety Difficulty Not difficult at all Not difficult at all Not difficult at all       02/22/2024    1:16 PM 02/14/2024   11:34 AM 12/28/2023    2:12 PM  Depression screen PHQ 2/9  Decreased Interest 0 0 0  Down, Depressed, Hopeless 0 0 0  PHQ - 2 Score 0 0 0  Altered sleeping 0 0 0  Tired, decreased energy 0 0 0  Change in appetite 0 0 0  Feeling bad or failure about yourself  0 0 0  Trouble concentrating 0 0 0  Moving slowly or fidgety/restless 0 0 0  Suicidal thoughts 0 0 0  PHQ-9 Score 0 0 0  Difficult doing work/chores Not difficult at all Not difficult at all Not difficult at all      02/22/2024    1:16 PM 02/14/2024    11:34 AM 12/28/2023    2:12 PM 09/24/2023    9:24 AM  GAD 7 : Generalized Anxiety Score  Nervous, Anxious, on Edge 0 0 0 0  Control/stop worrying 0 0 0 0  Worry too much - different things 0 0 0 0  Trouble relaxing 0 0 0 0  Restless 0 0 0 0  Easily annoyed or irritable 0 0 0 0  Afraid - awful might happen 0 0 0 0  Total GAD 7 Score 0 0 0 0  Anxiety Difficulty Not difficult at all Not difficult at all Not difficult at all    SDOH Screenings   Food Insecurity: No Food Insecurity (01/09/2024)  Housing: Low Risk  (01/09/2024)  Transportation Needs: No Transportation Needs (01/09/2024)  Utilities: Not At Risk (11/18/2023)   Received from [facility]  Depression [phone]): Low Risk  (02/22/2024)  Financial Resource Strain: Low Risk  (01/09/2024)  Physical Activity: Insufficiently Active (01/09/2024)  Social Connections: Moderately Integrated (01/09/2024)  Stress: No Stress Concern Present (01/09/2024)  Tobacco Use: Low Risk  (02/22/2024)     Physical Exam Constitutional:      General: He is not in acute distress.    Appearance: Normal appearance.  HENT:     Head: Normocephalic and atraumatic.     Right Ear: Tympanic membrane normal.     Left Ear: Tympanic membrane normal.     Mouth/Throat:     Mouth: Mucous membranes are moist.  Neck:     Thyroid : No thyroid  mass or thyroid  tenderness.   Cardiovascular:     Rate and Rhythm: Normal rate and regular rhythm.  Pulmonary:     Effort: Pulmonary effort is normal.     Breath sounds: Normal breath sounds.  Abdominal:     General: Bowel sounds are normal.     Palpations: Abdomen is soft.     Tenderness: There is no guarding.   Musculoskeletal:     Cervical back: Neck supple. No rigidity.     Right lower leg: No edema.     Left lower leg: No edema.   Skin:    General: Skin is warm.   Neurological:  Mental Status: He is alert and oriented to person, place, and time.   Psychiatric:        Mood and Affect: Mood normal.         Behavior: Behavior normal.      Diabetic foot exam was performed with the following findings:   Normal sensation of 10g monofilament Intact posterior tibialis and dorsalis pedis pulses B/L lower leg varicose veins noted, no skin ulceration.      No results found for any visits on 02/22/24.  The ASCVD Risk score (Arnett DK, et al., 2019) failed to calculate for the following reasons:   The valid total cholesterol range is 130 to 320 mg/dL     Assessment & Plan:   Mixed hyperlipidemia Assessment & Plan: Continue Atorvastatin  10 mg, LDL goal <70, within goal per lab from 01/06/24.    Orders: -     Atorvastatin  Calcium ; Take 1 tablet (10 mg total) by mouth daily.  Dispense: 90 tablet; Refill: 3  Type 2 diabetes mellitus with complications [facility]) Assessment & Plan: - This problem is chronic and improving. Goal A1c <7% Last A1c from 02/14/24 7.3%.   - Reviewed Libre reading for the last 14 days on patient's phone. Patient has 10 episodes of hypoglycemia over the last 14 days which is concerning. Discussed s/s and risks of hypoglycemia with the patient including loss of consciousness, seizures, and even death if not treated promptly treated. Recommend consuming 15 grams of carbohydrates and follow up with home BG check 15 min after that. If continues to be low go to ED. - Reduce Basaglar  to 46 units at night from 55 units. If escaping meals/dinner please do not give yourself insulin .  - Discontinue Humalog  15 units which he has been using once or twice a day.  - Continue Metformin  XR 500 mg, (4 tabls daily). Continue B 12 1000 mcg daily.  - Continue Ozempic  0.5 mg weekly.  Another sample of Ozempic  0.25 mg given today which will last him for 2 weeks. Discussed importance of eating nutrition rich food, not escaping meals.  - D/C Farxiga  5 mg, will restart in the future if needed. Was only taking once week/month (sample), not able to get the prescription due to cost.  - F/u in 3 months  or sooner with labs.    Orders: -     FreeStyle Libre 3 Sensor; Place 1 sensor on the skin every 14 days. Use to check glucose continuously  Dispense: 2 each; Refill: 6 -     Ozempic  (0.25 or 0.5 MG/DOSE); Inject 0.5 mg into the skin once a week.  Dispense: 1.5 mL; Refill: 2  Uncontrolled type 2 diabetes mellitus with hyperglycemia (HCC) Assessment & Plan: - This problem is chronic and improving. Goal A1c <7% Last A1c from 02/14/24 7.3%.   - Reviewed Libre reading for the last 14 days on patient's phone. Patient has 10 episodes of hypoglycemia over the last 14 days which is concerning. Discussed s/s and risks of hypoglycemia with the patient including loss of consciousness, seizures, and even death if not treated promptly treated. Recommend consuming 15 grams of carbohydrates and follow up with home BG check 15 min after that. If continues to be low go to ED. - Reduce Basaglar  to 46 units at night from 55 units. If escaping meals/dinner please do not give yourself insulin .  - Discontinue Humalog  15 units which he has been using once or twice a day.  - Continue Metformin  XR 500 mg, (4 tabls  daily). Continue B 12 1000 mcg daily.  - Continue Ozempic  0.5 mg weekly.  Another sample of Ozempic  0.25 mg given today which will last him for 2 weeks. Discussed importance of eating nutrition rich food, not escaping meals.  - D/C Farxiga  5 mg, will restart in the future if needed. Was only taking once week/month (sample), not able to get the prescription due to cost.  - F/u in 3 months or sooner with labs.    Orders: -     Basaglar  KwikPen; INJECT 46 UNITS SUBCUTANEOUSLY ONCE DAILY.  Dispense: 60 mL; Refill: 1 -     ReliOn Pen Needles; USE AS DIRECTED DAILY WITH INSULIN , once at night.  Dispense: 100 each; Refill: 1  Hypertension associated with diabetes (HCC) Assessment & Plan: BP goal <130/80 mmHg, within goal today. Continue Lisinopril  20 mg,  Amlodipine  5 mg, Propanolol 60 mg daily. Refill sent.  Propanol likely helping in migraine headaches as well.   Orders: -     Propranolol  HCl ER; Take 1 capsule (60 mg total) by mouth daily.  Dispense: 90 capsule; Refill: 1  Lumbar radiculopathy -     Gabapentin ; TAKE 2 CAPSULES BY MOUTH IN THE MORNING AND 3 AT BEDTIME  Dispense: 450 capsule; Refill: 0  Neuropathy Assessment & Plan: Stable on  Gabapentin  300 mg, 2 tab in AM, 3 tab in PM. Refill sent.  Likely has composition of varicose veins contributing to pain.  Continue to monitor.   Orders: -     Gabapentin ; TAKE 2 CAPSULES BY MOUTH IN THE MORNING AND 3 AT BEDTIME  Dispense: 450 capsule; Refill: 0  Essential hypertension Assessment & Plan: BP goal <130/80 mmHg, within goal today. Continue Lisinopril  20 mg,  Amlodipine  5 mg, Propanolol 60 mg daily. Refill sent. Propanol likely helping in migraine headaches as well.   Orders: -     amLODIPine  Besylate; Take 1 tablet (5 mg total) by mouth daily.  Dispense: 90 tablet; Refill: 3  Encounter for diabetic foot exam (HCC) Assessment & Plan: B/L varicose veins noted. Wear compression stockings.  Discussed referral to vascular surgery in the future if pain, lower leg edema, ulcers.    Varicose veins of leg with pain, bilateral Assessment & Plan: Lifestyle Modifications: Advise on leg elevation, weight management, and regular exercise. Compression Therapy: Recommend compression stockings 10-15 mmHg.  Referral: Consider referral to a vascular specialist for further evaluation and management if symptoms persist or worsen. Reevaluate during follow up.     Chronic pain of both shoulders Assessment & Plan: Management and plan per Dr. Klifto at [facility] orthoped[facility].  Has complete tear of right shoulder on 03/16/24.    Need for vaccination against Streptococcus pneumoniae -     Pneumococcal conjugate vaccine 20-valent  Type 2 diabetes mellitus with diabetic neuropathy, with long-term current use of insulin  (HCC) Assessment & Plan:  -  Continue gabapentin  600 mg AM, 900 mg PM, refill sent. PDMP reviewed, appropriate.    Other orders -     Lisinopril ; Take 2 tablets (40 mg total) by mouth daily.  Dispense: 90 tablet; Refill: 3 -     metFORMIN  HCl ER; Take 4 tablets (2,000 mg total) by mouth daily with breakfast.  Dispense: 360 tablet; Refill: 3 -     Ozempic  (0.25 or 0.5 MG/DOSE); Inject 0.5 mg into the skin once a week.  Pneumonia immunization updated. Patient will check with his insurance, pharmacy to update shingles immunization.    I spent 45 minutes on the day  of this face-to-face encounter reviewing the patient's medical and surgical history, medications, ongoing concerns, and reviewing the assessment and plan with the patient. This time also included counseling the patient on their health conditions and management options.   Return in about 3 months (around 05/24/2024) for for DM and labs.   Jacklin Mascot, MD

## 2024-02-22 NOTE — Assessment & Plan Note (Signed)
 Stable on  Gabapentin  300 mg, 2 tab in AM, 3 tab in PM. Refill sent.  Likely has composition of varicose veins contributing to pain.  Continue to monitor.

## 2024-02-22 NOTE — Assessment & Plan Note (Signed)
 BP goal <130/80 mmHg, within goal today. Continue Lisinopril  20 mg,  Amlodipine  5 mg, Propanolol 60 mg daily. Refill sent. Propanol likely helping in migraine headaches as well.

## 2024-02-22 NOTE — Assessment & Plan Note (Signed)
 B/L varicose veins noted. Wear compression stockings.  Discussed referral to vascular surgery in the future if pain, lower leg edema, ulcers.

## 2024-02-22 NOTE — Assessment & Plan Note (Signed)
 Management and plan per Dr. Klifto at [facility] orthopedics.  Has complete tear of right shoulder on 03/16/24.

## 2024-02-22 NOTE — Assessment & Plan Note (Addendum)
-   This problem is chronic and improving. Goal A1c <7% Last A1c from 02/14/24 7.3%.   - Reviewed Libre reading for the last 14 days on patient's phone. Patient has 10 episodes of hypoglycemia over the last 14 days which is concerning. Discussed s/s and risks of hypoglycemia with the patient including loss of consciousness, seizures, and even death if not treated promptly treated. Recommend consuming 15 grams of carbohydrates and follow up with home BG check 15 min after that. If continues to be low go to ED. - Reduce Basaglar  to 46 units at night from 55 units. If escaping meals/dinner please do not give yourself insulin .  - Discontinue Humalog  15 units which he has been using once or twice a day.  - Continue Metformin  XR 500 mg, (4 tabls daily). Continue B 12 1000 mcg daily.  - Continue Ozempic  0.5 mg weekly.  Another sample of Ozempic  0.25 mg given today which will last him for 2 weeks. Discussed importance of eating nutrition rich food, not escaping meals.  - D/C Farxiga  5 mg, will restart in the future if needed. Was only taking once week/month (sample), not able to get the prescription due to cost.  - F/u in 3 months or sooner with labs.

## 2024-02-23 ENCOUNTER — Other Ambulatory Visit: Payer: Self-pay

## 2024-03-01 ENCOUNTER — Encounter

## 2024-03-06 DIAGNOSIS — E1165 Type 2 diabetes mellitus with hyperglycemia: Secondary | ICD-10-CM

## 2024-03-06 MED ORDER — LANTUS SOLOSTAR 100 UNIT/ML ~~LOC~~ SOPN: 40.0000 [IU] | PEN_INJECTOR | Freq: Every day | SUBCUTANEOUS | 2 refills | Status: DC

## 2024-03-13 DIAGNOSIS — S46011D Strain of muscle(s) and tendon(s) of the rotator cuff of right shoulder, subsequent encounter: Secondary | ICD-10-CM | POA: Diagnosis not present

## 2024-03-13 DIAGNOSIS — W1839XD Other fall on same level, subsequent encounter: Secondary | ICD-10-CM | POA: Diagnosis not present

## 2024-03-14 ENCOUNTER — Encounter: Admitting: Nurse Practitioner

## 2024-03-15 ENCOUNTER — Other Ambulatory Visit (HOSPITAL_BASED_OUTPATIENT_CLINIC_OR_DEPARTMENT_OTHER): Payer: Self-pay

## 2024-04-04 ENCOUNTER — Encounter: Payer: Self-pay | Admitting: Pharmacist

## 2024-04-06 ENCOUNTER — Other Ambulatory Visit (INDEPENDENT_AMBULATORY_CARE_PROVIDER_SITE_OTHER): Admitting: Pharmacist

## 2024-04-06 DIAGNOSIS — E1165 Type 2 diabetes mellitus with hyperglycemia: Secondary | ICD-10-CM

## 2024-04-06 NOTE — Progress Notes (Signed)
 04/13/2024 Name: Jonathan West MRN: [id number] DOB: 03-Sep-1961  Subjective  Chief Complaint  Patient presents with   Diabetes    Reason for visit: ?  Jonathan West is a 63 y.o. male with a history of diabetes (type 2), who presents today for a follow up diabetes pharmacotherapy visit.? They were referred by with PCP for medication management related to Diabetes, HTN, HLD.  Pertinent PMH includes HTN, T2DM, HLD, diabetic neuropathy, ED.  Known DM Complications: autonomic neuropathy (ED), peripheral neuropathy, retinopathy  Care Team: Primary Care Provider: Dr. Glenys Ferrari (transition of care to Dr. Abbey upcoming)   Date of Last Diabetes Related Visit: with PCP on 12/28/23 and with Pharmacist on 01/10/24  Recent Summary of Change: Insurance switched basaglar  to Lantus  6/17: Stop Humalog . ?? basaglar  to 46 u 6/9: patient had reduced to 46 but notices blood sugars had gone up and went back to 55  5/27:?? basaglar  to 46 units 5/5: ??basaglar  to 55 u/d (10%) 4/22: Start Farxiga  5 mg/d (sample), Start Ozempic  0.25 mg/week   Medication Access/Adherence: Prescription drug coverage: Payor: BLUE CROSS BLUE SHIELD / Plan: BCBS COMM PPO / Product Type: *No Product type* / . Very high deductible plan (deductible = multiple thousands. Brand-name meds not feasible) - Confirms he has been in contact with his Medicare contact to discuss plans. Turns 63 this week and plans to enroll in Medicare. Has provided active list of his meds to the Medicare person including Farxiga  and Ozempic .  - Has received Medicare A. Prescription benefits still covered fully through his job  Freestyle $80/month Basaglar : $120/90dsOzempic  $977 (using samples)  - Wife on current plan and therefore does not plan to switch to Medicare D plan.   Since Last visit / History of Present Illness: ?  Patient reports implementing plan from last visit. Confirms basal reduction as instructed. Confirms stopping Humalog . Feels  glycemic control has been complicated by inconsistent eating cycles (works and is on the road often).   Surrounding surgery, held 1 dose of Ozempic  prior to surgery then resumed at lower dose (0.25) due to constipation after surgery. Most recently, has increased Ozempic  back to 0.5 and denies any further issues or concerns.   170 lb on home scale today (low appetite, improved with decreasing to 0.25 surrounding surgery. Since increasing back to 0.5, notes appetite has lessened again.   Reported DM Regimen: ?   (ran out of sample) Metformin  XR 2000 mg daily with breakfast Ozempic  0.5 mg weekly (sample) Lantus  (glargine) 46 units daily (bedtime)  discontinued by PCP 2/2 significant #of lows.   DM medications tried in the past:?  Metformin  IR (stomach upset, tolerated XR well) Jardiance  (stopped due to cost, tolerated well)  SMBG: ?Libre 3 CGM Report (Date Range 7/18 - 7/31) ABG 178 mg/dL; GMI 2.3%; Variability 26.5%; TIR 53%, high 39%, very high 8%, low 0%, very low 0%     Hypo/Hyperglycemia: ?  Symptoms of hypoglycemia since last visit:? no. One reading 69 mg/dL  Symptoms of hyperglycemia since last visit:?No   Reported Diet: Patient typically eats 1-2 meals per day (confirms Humalog  use only prior to meals 1-2 times daily)   - 1 day per week: Has 3 meals  - 3 days per week: Has 1 meal (dinner)  - 3 days per week: Has 2 meals (lunch + dinner) Beverages: Used to drink diet soda 90% of the time. Now, drinks only water. With dinner, one diet drink. No more sweet tea.  DM Prevention:  Statin: Taking; moderate intensity.?  History of chronic kidney disease? no History of albuminuria? yes, (54 mg/g 2017), last UACR on 12/28/23 = too low to calculate ACE/ARB - Taking lisinorpil 40 mg daily; Urine MA/CR Ratio - normal.  Last eye exam: 08/2018; Retinopathy Present Last foot exam: 02/22/2024 Tobacco Use: Never smoker Immunizations:? Flu: Due (Last: 06/28/2020); Pneumococcal: No Record -  DUE; Shingrix: No Record - DUE   Cardiovascular Risk Reduction History of clinical ASCVD? no History of heart failure? no  History of hyperlipidemia? yes Current BMI: 26.1 kg/m2 (Ht 70 in, Wt 82.7 kg) Taking statin? yes; moderate intensity (atorvastatin  10) Taking aspirin? unclear if indicated; Taking   Taking SGLT-2i? yes (medication sample) Taking GLP- 1 RA? yes (medication sample)      _______________________________________________  Objective    Review of Systems:? Limited in the setting of virtual visit  Constitutional:? No fever, chills or unintentional weight loss  GI:? No nausea, vomiting, constipation, diarrhea, abdominal pain, dyspepsia, change in bowel habits  Endocrine:? No polyuria, polyphagia or blurred vision    Physical Examination:  Vitals:  Wt Readings from Last 3 Encounters:  02/22/24 179 lb (81.2 kg)  02/14/24 178 lb 9.6 oz (81 kg)  12/28/23 182 lb 4 oz (82.7 kg)   BP Readings from Last 3 Encounters:  02/22/24 116/78  02/14/24 132/78  12/28/23 130/66   Pulse Readings from Last 3 Encounters:  02/22/24 90  02/14/24 85  12/28/23 68    Labs:?  Lab Results  Component Value Date   HGBA1C 7.3 (A) 02/14/2024   HGBA1C 8.7 (A) 12/28/2023   HGBA1C 10.3 (A) 09/24/2023   GLUCOSE 58 (L) 01/06/2024   MICRALBCREAT Unable to calculate 12/28/2023   CREATININE 1.22 01/06/2024   CREATININE 1.17 06/16/2023   CREATININE 1.31 11/16/2022   GFR 62.46 01/06/2024   GFR 65.94 06/16/2023   GFR 57.81 (L) 11/16/2022    Lab Results  Component Value Date   CHOL 83 01/06/2024   LDLCALC 42 01/06/2024   LDLCALC 43 06/16/2023   LDLCALC 64 01/12/2022   LDLDIRECT 58.0 09/13/2017   HDL 30.40 (L) 01/06/2024   TRIG 50.0 01/06/2024   TRIG 112.0 06/16/2023   TRIG 63.0 01/12/2022   ALT 15 01/06/2024   ALT 18 06/16/2023   AST 19 01/06/2024   AST 15 06/16/2023      Chemistry      Component Value Date/Time   NA 141 01/06/2024 0936   K 4.6 01/06/2024 0936   CL 108  01/06/2024 0936   CO2 24 01/06/2024 0936   BUN 18 01/06/2024 0936   CREATININE 1.22 01/06/2024 0936   CREATININE 1.17 06/28/2020 1515      Component Value Date/Time   CALCIUM  9.2 01/06/2024 0936   ALKPHOS 84 01/06/2024 0936   AST 19 01/06/2024 0936   ALT 15 01/06/2024 0936   BILITOT 0.6 01/06/2024 0936     The ASCVD Risk score (Arnett DK, et al., 2019) failed to calculate for the following reasons:   The valid total cholesterol range is 130 to 320 mg/dL  Assessment and Plan:   1. Diabetes, type 2: uncontrolled - A1c 7.3% (02/14/24), improved from 8.7% (Apr'25), 10.3% (Jan'25). Goal <7% w/o hypoglycemia. Is s/p shoulder surgery and is recovering well. Goal remains <8.5% for operation on other shoulder in the future.  We again discussed Medicare eligibility and importance of selecting a plan that will offer Rx coverage for brand medications at an affordable copay as to allow continuation of  Ozempic , Farxiga , CGM which will promote insulin  reduction. However, we established that he plans to continue with his high-deductible plan as this provides coverage for his wife who will not turn 65 soon. Brand medication will remain cost-prohibitive though he will reach out to his employer to see if there are different levels of plans to choose from during open enrollment.    CGM data: Slightly hyperglycemic since stopping prandial insulin  with GMI approaching 8%. No longer seeing regular lows. Basal titration limited with fasting sugars often at goal. X1 fasting of 69 mg/dL. Current Regimen: Ozempic  (using samples, cannot afford long-term), metformin  2000mg /d, glargine 46u/d Reviewed s/sx/tx hypoglycemia  Future Consideration:  (cost-prohibitive, not eligible for PAP with commercial insurance. Copay card not sufficient in lowering cost)  (cost-prohibitive, not eligible for PAP with commercial insurance. Copay card not sufficient in lowering cost) : (cost-prohibitive) Metformin : At goal dose  : Avoid  d/t risk hypoglycemia while on insulin   Basal insulin  > concentrated for better absorption if ongoing titration needed (Tresiba  U200 or Toujeo  U300) Tresiba  U200: Copay Card =  as little as $35 per 30-day supply (with a maximum of $65 per 30-day supply) or no more than $99 per prescription.   Healthcare Maintenance:  Pneumococcal - Current status: Due - PCV20 Shingles - Current status: Due - no record Influenza - Current status: Overdue.   Due to receive the following vaccines: Shingrix and PCV20 Patient reports his wife just contracted shingles. He plans to ask about the Shingrix vaccine tomorrow at his local pharmacy.    Follow Up CGM review ~3 weeks PCP follow up is scheduled for September  Future Appointments  Date Time Provider Department Center  05/19/2024  8:45 AM [name]  05/29/2024  2:00 PM Bair, Luke, MD [name]    Manuelita FABIENE Kobs, PharmD Clinical Pharmacist [facility] [phone]

## 2024-04-26 DIAGNOSIS — Z96611 Presence of right artificial shoulder joint: Secondary | ICD-10-CM | POA: Insufficient documentation

## 2024-05-05 ENCOUNTER — Telehealth: Payer: Self-pay

## 2024-05-05 DIAGNOSIS — E118 Type 2 diabetes mellitus with unspecified complications: Secondary | ICD-10-CM

## 2024-05-05 NOTE — Telephone Encounter (Signed)
 Lab order needed

## 2024-05-08 NOTE — Addendum Note (Signed)
 Addended by: Kamdyn Colborn on: 05/08/2024 03:57 PM   Modules accepted: Orders

## 2024-05-08 NOTE — Telephone Encounter (Signed)
 1. Type 2 diabetes mellitus with complications (HCC) (Primary) - Repeat A1c after 05/16/24, last A1c on 02/14/24 - HgB A1c; Future  Luke Shade, MD

## 2024-05-09 ENCOUNTER — Encounter: Payer: Self-pay | Admitting: Pharmacist

## 2024-05-09 NOTE — Progress Notes (Signed)
 Chart Review Reason: CGM/Insulin  monitoring   Summary: CGM Report (Date Range 8/20 - 9/2) ABG 179 mg/dL; GMI 2.4%; Variability 28.9%; TIR 56%, high 36%, very high 8%, low 0%, very low 0%      Considerations: No medication changes today. GMI on CMG report = 7.5%.  BG remain quite variable, related to dietary choices with some days relatively well-controlled. On other days, mealtime peaks remain an issue somewhat regularly, primarily at afternoon and evening meals.  Brand medication remain unaffordable.  PCP visit upcoming. A1c repeat ordered.  Consider review of CGM and A1c at that time with risk/benefit discussion of prandial insulin  based on glycemic control.    Jonathan West, PharmD Clinical Pharmacist [facility] [phone]

## 2024-05-18 ENCOUNTER — Ambulatory Visit: Payer: Self-pay

## 2024-05-18 ENCOUNTER — Other Ambulatory Visit (INDEPENDENT_AMBULATORY_CARE_PROVIDER_SITE_OTHER)

## 2024-05-18 DIAGNOSIS — E118 Type 2 diabetes mellitus with unspecified complications: Secondary | ICD-10-CM

## 2024-05-18 LAB — HEMOGLOBIN A1C: Hgb A1c MFr Bld: 7.8 % — ABNORMAL HIGH (ref 4.6–6.5)

## 2024-05-18 NOTE — Telephone Encounter (Signed)
 Do we have sample on Ozempic  0.5? If we do we can give him 1 month refill on Ozempic . Otherwise we will have to discontinue Ozempic  for cost reason.   Thank you, Luke Shade, MD

## 2024-05-19 ENCOUNTER — Other Ambulatory Visit

## 2024-05-21 ENCOUNTER — Other Ambulatory Visit: Payer: Self-pay

## 2024-05-21 DIAGNOSIS — M5416 Radiculopathy, lumbar region: Secondary | ICD-10-CM

## 2024-05-21 DIAGNOSIS — G629 Polyneuropathy, unspecified: Secondary | ICD-10-CM

## 2024-05-29 ENCOUNTER — Other Ambulatory Visit: Payer: Self-pay

## 2024-05-29 ENCOUNTER — Ambulatory Visit

## 2024-05-29 VITALS — BP 120/70 | HR 77 | Temp 98.4°F | Ht 70.0 in | Wt 172.8 lb

## 2024-05-29 DIAGNOSIS — E1140 Type 2 diabetes mellitus with diabetic neuropathy, unspecified: Secondary | ICD-10-CM

## 2024-05-29 DIAGNOSIS — E118 Type 2 diabetes mellitus with unspecified complications: Secondary | ICD-10-CM

## 2024-05-29 DIAGNOSIS — M5416 Radiculopathy, lumbar region: Secondary | ICD-10-CM

## 2024-05-29 DIAGNOSIS — G629 Polyneuropathy, unspecified: Secondary | ICD-10-CM

## 2024-05-29 DIAGNOSIS — I152 Hypertension secondary to endocrine disorders: Secondary | ICD-10-CM

## 2024-05-29 DIAGNOSIS — Z794 Long term (current) use of insulin: Secondary | ICD-10-CM

## 2024-05-29 DIAGNOSIS — E1159 Type 2 diabetes mellitus with other circulatory complications: Secondary | ICD-10-CM

## 2024-05-29 DIAGNOSIS — I10 Essential (primary) hypertension: Secondary | ICD-10-CM

## 2024-05-29 DIAGNOSIS — Z23 Encounter for immunization: Secondary | ICD-10-CM

## 2024-05-29 DIAGNOSIS — E1165 Type 2 diabetes mellitus with hyperglycemia: Secondary | ICD-10-CM | POA: Diagnosis not present

## 2024-05-29 DIAGNOSIS — I83813 Varicose veins of bilateral lower extremities with pain: Secondary | ICD-10-CM

## 2024-05-29 DIAGNOSIS — E782 Mixed hyperlipidemia: Secondary | ICD-10-CM

## 2024-05-29 MED ORDER — LANTUS SOLOSTAR 100 UNIT/ML ~~LOC~~ SOPN
45.0000 [IU] | PEN_INJECTOR | Freq: Every day | SUBCUTANEOUS | 1 refills | Status: DC
Start: 2024-05-29 — End: 2024-06-01

## 2024-05-29 MED ORDER — PROPRANOLOL HCL ER 60 MG PO CP24
60.0000 mg | ORAL_CAPSULE | Freq: Every day | ORAL | 3 refills | Status: AC
Start: 2024-08-07 — End: ?

## 2024-05-29 MED ORDER — PROPRANOLOL HCL ER 60 MG PO CP24: 60.0000 mg | ORAL_CAPSULE | Freq: Every day | ORAL | 3 refills | Status: DC

## 2024-05-29 MED ORDER — GLIPIZIDE 2.5 MG PO TABS: 2.5000 mg | ORAL_TABLET | Freq: Every day | ORAL | 1 refills | Status: DC

## 2024-05-29 MED ORDER — GABAPENTIN 300 MG PO CAPS: ORAL_CAPSULE | ORAL | 2 refills | Status: AC

## 2024-05-29 NOTE — Assessment & Plan Note (Signed)
 Involving b/l lower extremities.  Stable on Gabapentin  300 mg, 2 tab in AM, 3 tab in PM.  PDMP reviewed, last refill was on 02/29/2024. Refill sent.  Patient has not yet started wearing compression stockings, again counseled him to start wearing compression stockings to help with varicose veins.

## 2024-05-29 NOTE — Assessment & Plan Note (Addendum)
 Complications:Hypertension, hyperglycemia, HTN, blood glucose not within goal.  Goal A1c 7% or less. Due to cost patient not able to continue GLP-1, SGLT-2 medication. Reached out to [facility] pharmacy, GLP-1 and or SGLT-2 both going to cost him over 900$ per pharmacy.  Reviewed home BG, no low episodes.  Reviewed Libre reading for the last 14 days on patient's phone, all over 200.  No episode of hypoglycemia. - Continue Metformin  XR 500 mg, (4 tabls daily). Continue B 12 1000 mcg daily.  - Increase Glargine to 45 units from 40 units at night.  - Start Glipizide  2.5 mg in the morning.  - Discussed low BG protocol.  - New refill sent to the pharmacy.  - F/U in 3 months or sooner.  - Influenza updated today.

## 2024-05-29 NOTE — Progress Notes (Signed)
 Established Patient Office Visit   Subjective  Patient ID: Jonathan West, male    DOB: Dec 11, 1960  Age: 63 y.o. MRN: [id number]  Chief Complaint  Patient presents with   Diabetes    He  has a past medical history of Complication of anesthesia, Diabetes mellitus without complication (HCC), History of anemia (08/11/2017), Hyperlipidemia, Hypertension, and Migraines.  HPI Discussed the use of AI scribe software for clinical note transcription with the patient, who gave verbal consent to proceed.  History of Present Illness Jonathan West is a 63 year old male with diabetes mellitus who presents for management of uncontrolled blood sugar levels.  He has had diabetes mellitus for twenty years, with hemoglobin A1c levels consistently above 7%. His most recent A1c on September 11th was 7.8%, an increase from previous values of 7.3% and 8.7%. He has not taken Ozempic  for three to four weeks, which he believes has contributed to the increase in his A1c. Blood sugar levels have been fluctuating, with some readings over 300 mg/dL, and the lowest being 795 mg/dL over the past week. He takes metformin  2000 mg in the morning, lisinopril  20 mg, and insulin  40 units at bedtime. He has experienced low blood sugar episodes in the past when on multiple insulin  medications, leading to the discontinuation of short-acting insulin  and Farxiga  due to cost.  He underwent right shoulder surgery on July 10th, which went well without any pain post-operatively. He only required Tylenol  for pain management, which he stopped a couple of weeks ago. He is still undergoing physical therapy and reports improvement, although he cannot lift his arm as high as desired. He anticipates needing surgery on the other shoulder as well.  He reports hoarseness for over three weeks without sore throat, sometimes losing his voice. He has not taken Zyrtec recently due to running out of it. He has a history of allergies and takes  Zyrtec as needed.  He takes gabapentin  300 mg, two in the morning and three at night, for neuropathy. He also takes amlodipine  and atorvastatin  (Lipitor) 10 mg for hypertension and hyperlipidemia, respectively. His LDL cholesterol was 42 mg/dL in May.    ROS As per HPI    Objective:     BP 120/70 (BP Location: Right Arm, Patient Position: Sitting, Cuff Size: Small)   Pulse 77   Temp 98.4 F (36.9 C) (Oral)   Ht 5' 10 (1.778 m)   Wt 172 lb 12.8 oz (78.4 kg)   SpO2 98%   BMI 24.79 kg/m      05/29/2024    2:09 PM 02/22/2024    1:16 PM 02/14/2024   11:34 AM  Depression screen PHQ 2/9  Decreased Interest 0 0 0  Down, Depressed, Hopeless 0 0 0  PHQ - 2 Score 0 0 0  Altered sleeping 0 0 0  Tired, decreased energy 1 0 0  Change in appetite 0 0 0  Feeling bad or failure about yourself  0 0 0  Trouble concentrating 0 0 0  Moving slowly or fidgety/restless 0 0 0  Suicidal thoughts 0 0 0  PHQ-9 Score 1 0 0  Difficult doing work/chores Not difficult at all Not difficult at all Not difficult at all      05/29/2024    2:09 PM 02/22/2024    1:16 PM 02/14/2024   11:34 AM 12/28/2023    2:12 PM  GAD 7 : Generalized Anxiety Score  Nervous, Anxious, on Edge 0 0 0 0  Control/stop worrying 0 0 0 0  Worry too much - different things 0 0 0 0  Trouble relaxing 0 0 0 0  Restless 0 0 0 0  Easily annoyed or irritable 0 0 0 0  Afraid - awful might happen 0 0 0 0  Total GAD 7 Score 0 0 0 0  Anxiety Difficulty Not difficult at all Not difficult at all Not difficult at all Not difficult at all      05/29/2024    2:09 PM 02/22/2024    1:16 PM 02/14/2024   11:34 AM  Depression screen PHQ 2/9  Decreased Interest 0 0 0  Down, Depressed, Hopeless 0 0 0  PHQ - 2 Score 0 0 0  Altered sleeping 0 0 0  Tired, decreased energy 1 0 0  Change in appetite 0 0 0  Feeling bad or failure about yourself  0 0 0  Trouble concentrating 0 0 0  Moving slowly or fidgety/restless 0 0 0  Suicidal thoughts 0 0 0   PHQ-9 Score 1 0 0  Difficult doing work/chores Not difficult at all Not difficult at all Not difficult at all      05/29/2024    2:09 PM 02/22/2024    1:16 PM 02/14/2024   11:34 AM 12/28/2023    2:12 PM  GAD 7 : Generalized Anxiety Score  Nervous, Anxious, on Edge 0 0 0 0  Control/stop worrying 0 0 0 0  Worry too much - different things 0 0 0 0  Trouble relaxing 0 0 0 0  Restless 0 0 0 0  Easily annoyed or irritable 0 0 0 0  Afraid - awful might happen 0 0 0 0  Total GAD 7 Score 0 0 0 0  Anxiety Difficulty Not difficult at all Not difficult at all Not difficult at all Not difficult at all   SDOH Screenings   Food Insecurity: No Food Insecurity (05/28/2024)  Housing: Low Risk  (05/28/2024)  Transportation Needs: No Transportation Needs (05/28/2024)  Utilities: Not At Risk (11/18/2023)   Received from [facility]  Depression [phone]): Low Risk  (05/29/2024)  Financial Resource Strain: Low Risk  (05/28/2024)  Physical Activity: Insufficiently Active (05/28/2024)  Social Connections: Moderately Integrated (05/28/2024)  Stress: No Stress Concern Present (05/28/2024)  Tobacco Use: Low Risk  (05/29/2024)     Physical Exam Constitutional:      Appearance: Normal appearance.  HENT:     Head: Normocephalic and atraumatic.     Mouth/Throat:     Mouth: Mucous membranes are moist.  Neck:     Thyroid : No thyroid  mass or thyroid  tenderness.  Cardiovascular:     Rate and Rhythm: Normal rate and regular rhythm.  Pulmonary:     Effort: Pulmonary effort is normal.     Breath sounds: Normal breath sounds.  Abdominal:     General: Bowel sounds are normal.     Palpations: Abdomen is soft.  Musculoskeletal:     Cervical back: Neck supple. No rigidity.     Right lower leg: No edema.     Left lower leg: No edema.  Skin:    General: Skin is warm.  Neurological:     Mental Status: He is alert and oriented to person, place, and time.  Psychiatric:        Mood and Affect: Mood  normal.        Behavior: Behavior normal.        No results found for any  visits on 05/29/24.  The ASCVD Risk score (Arnett DK, et al., 2019) failed to calculate for the following reasons:   The valid total cholesterol range is 130 to 320 mg/dL    Reviewed: 0/88/74: J8r 7.8%   Assessment & Plan:   Uncontrolled type 2 diabetes mellitus with hyperglycemia (HCC) -     Lantus  SoloStar; Inject 45 Units into the skin at bedtime.  Dispense: 40.5 mL; Refill: 1 -     glipiZIDE ; Take 2.5 mg by mouth daily.  Dispense: 90 tablet; Refill: 1 -     Hemoglobin A1c; Future  Hypertension associated with diabetes (HCC) -     Propranolol  HCl ER; Take 1 capsule (60 mg total) by mouth daily.  Dispense: 90 capsule; Refill: 3 -     Comprehensive metabolic panel with GFR; Future  Needs flu shot -     Flu vaccine HIGH DOSE PF(Fluzone Trivalent)  Essential hypertension Assessment & Plan: BP goal <130/80 mmHg, within goal today. Patient asymptomatic.  Continue Lisinopril  20 mg,  Amlodipine  5 mg, Propanolol 60 mg daily.    Mixed hyperlipidemia Assessment & Plan: Continue Atorvastatin  10 mg, LDL goal <70, tolerating well.    Neuropathy Assessment & Plan: Involving b/l lower extremities.  Stable on Gabapentin  300 mg, 2 tab in AM, 3 tab in PM.  PDMP reviewed, last refill was on 02/29/2024. Refill sent.  Patient has not yet started wearing compression stockings, again counseled him to start wearing compression stockings to help with varicose veins.    Type 2 diabetes mellitus with diabetic neuropathy, with long-term current use of insulin  [facility]) Assessment & Plan: Involving b/l lower extremities.  Stable on Gabapentin  300 mg, 2 tab in AM, 3 tab in PM.  PDMP reviewed, last refill was on 02/29/2024. Refill sent.  Patient has not yet started wearing compression stockings, again counseled him to start wearing compression stockings to help with varicose veins.   Orders: -     Gabapentin ; TAKE 2  CAPSULES BY MOUTH IN THE MORNING AND 3 AT BEDTIME  Dispense: 450 capsule; Refill: 2  Varicose veins of leg with pain, bilateral Assessment & Plan: Patient has not yet started wearing compression stockings, again counseled him to start wearing compression stockings to help with varicose veins    Lumbar radiculopathy -     Gabapentin ; TAKE 2 CAPSULES BY MOUTH IN THE MORNING AND 3 AT BEDTIME  Dispense: 450 capsule; Refill: 2  Type 2 diabetes mellitus with complications (HCC) Assessment & Plan: Complications:Hypertension, hyperglycemia, HTN, blood glucose not within goal.  Goal A1c 7% or less. Due to cost patient not able to continue GLP-1, SGLT-2 medication. Reached out to [facility] pharmacy, GLP-1 and or SGLT-2 both going to cost him over 900$ per pharmacy.  Reviewed home BG, no low episodes.  Reviewed Libre reading for the last 14 days on patient's phone, all over 200.   - Continue Metformin  XR 500 mg, (4 tabls daily). Continue B 12 1000 mcg daily.  - Increase Glargine to 45 units from 40 units at night.  - Start Glipizide  2.5 mg in the morning.  - Discussed low BG protocol.  - New refill sent to the pharmacy.  - F/U in 3 months or sooner.  - Influenza updated today.       Return in about 3 months (around 08/28/2024), or DM follow up.   Luke Shade, MD

## 2024-05-29 NOTE — Assessment & Plan Note (Signed)
 BP goal <130/80 mmHg, within goal today. Patient asymptomatic.  Continue Lisinopril  20 mg,  Amlodipine  5 mg, Propanolol 60 mg daily.

## 2024-05-29 NOTE — Patient Instructions (Addendum)
-   Shingles and Influenza I would recommend updating this through your local pharmacy.

## 2024-05-29 NOTE — Assessment & Plan Note (Signed)
 Continue Atorvastatin  10 mg, LDL goal <70, tolerating well.

## 2024-05-29 NOTE — Assessment & Plan Note (Signed)
 Patient has not yet started wearing compression stockings, again counseled him to start wearing compression stockings to help with varicose veins

## 2024-05-30 ENCOUNTER — Telehealth: Payer: Self-pay

## 2024-05-30 ENCOUNTER — Other Ambulatory Visit (HOSPITAL_COMMUNITY): Payer: Self-pay

## 2024-05-30 ENCOUNTER — Other Ambulatory Visit: Payer: Self-pay

## 2024-05-30 DIAGNOSIS — E1165 Type 2 diabetes mellitus with hyperglycemia: Secondary | ICD-10-CM

## 2024-05-30 NOTE — Telephone Encounter (Unsigned)
 Copied from CRM [id number]. Topic: Clinical - Medication Question >> May 30, 2024  2:09 PM Corin V wrote: Reason for CRM: [employer] calling to advise that the Lantis Solo Star Pen has been approved starting today through 05/30/25. Paperwork will be faxed over with the approval information.

## 2024-05-30 NOTE — Telephone Encounter (Signed)
 Pharmacy Patient Advocate Encounter   Received notification from Physician's Office that prior authorization for Lantus  SoloStar 100UNIT/ML pen-injectors  is required/requested.   Insurance verification completed.   The patient is insured through [facility] .   Per test claim: PA required; PA submitted to above mentioned insurance via Latent Key/confirmation #/EOC [id number] Status is pending

## 2024-05-30 NOTE — Telephone Encounter (Signed)
 Pharmacy Patient Advocate Encounter  Received notification from [facility] that Prior Authorization for Lantus  SoloStar 100UNIT/ML pen-injectors has been APPROVED from 05/30/2024 to 05/30/2025   PA #/Case ID/Reference #: [id number]

## 2024-06-01 NOTE — Telephone Encounter (Signed)
 Pharmacy Patient Advocate Encounter  Received notification from [facility] that Prior Authorization for Lantus  SoloStar 100UNIT/ML pen-injectors has been APPROVED from 05/30/2024 to 05/30/2025   PA #/Case ID/Reference #: [id number]

## 2024-06-08 DIAGNOSIS — Z96611 Presence of right artificial shoulder joint: Secondary | ICD-10-CM | POA: Diagnosis not present

## 2024-06-08 DIAGNOSIS — Z4889 Encounter for other specified surgical aftercare: Secondary | ICD-10-CM | POA: Diagnosis not present

## 2024-06-12 ENCOUNTER — Other Ambulatory Visit (INDEPENDENT_AMBULATORY_CARE_PROVIDER_SITE_OTHER): Admitting: Pharmacist

## 2024-06-12 DIAGNOSIS — E118 Type 2 diabetes mellitus with unspecified complications: Secondary | ICD-10-CM

## 2024-06-12 NOTE — Progress Notes (Signed)
 06/12/2024 Name: Jonathan West MRN: [id number] DOB: 03/15/1961  Subjective  No chief complaint on file.   Reason for visit: ?  Jonathan West is a 63 y.o. male with a history of diabetes (type 2), who presents today for a follow up diabetes pharmacotherapy visit.? They were referred by with PCP for medication management related to Diabetes, HTN, HLD.  Pertinent PMH includes HTN, T2DM, HLD, diabetic neuropathy, ED.  Known DM Complications: autonomic neuropathy (ED), peripheral neuropathy, retinopathy  Care Team: Primary Care Provider: Dr.  Kalpana Bair   Recent Summary of Change: 9/22: PCP add glipizide  2.5mg  Insurance switched basaglar  to Lantus  6/17: PCP Stop Humalog . ?? basaglar  to 46 u 6/9: Notices blood sugars had gone up, self-increased back to 55u  5/27:?? basaglar  to 46u 5/5: ??basaglar  to 55 u/d (10%) 4/22: Start Farxiga  5 mg/d (sample), Start Ozempic  0.25 mg/week   Medication Access/Adherence: Prescription drug coverage: Payor: BLUE CROSS BLUE SHIELD / Plan: BCBS COMM PPO / Product Type: *No Product type* / . Very high deductible plan (deductible = multiple thousands. Brand-name meds not feasible) - Confirms he has been in contact with his Medicare contact to discuss plans. Turns 63 this week and plans to enroll in Medicare. Has provided active list of his meds to the Medicare person including Farxiga  and Ozempic .  - Has received Medicare A. Prescription benefits still covered fully through his job  Freestyle $68/month Basaglar : $120/90dsOzempic  $977 (using samples)  - Wife on current plan and therefore does not plan to switch to Medicare D plan  Since Last visit / History of Present Illness: ?  Patient reports sugars have been very high lately. Denies missed doses of lantus  or metformin . No longer taking Ozempic  due to cost.  Reports glipizide  was prescribed at last PCP visit though 2 prescriptions were received at pharmacy (IR vs XL) so he did not get this filled  until last week. First dose was on Saturday (2 days ago) so he has not noted much change in sugars yet. Notes his bottle states glipizide  XL, 1 tablet at bedtime.  Reported DM Regimen: ?  Metformin  XR 2000 mg daily with breakfast Lantus  (glargine) 45 units daily (bedtime) Glipizide  XL 2.5 mg at bedtime ($50 at the pharmacy for 1 month with savings card) - (started Sat 10/4)   DM medications tried in the past:?  Metformin  IR (stomach upset, tolerated XR well) Jardiance  (stopped due to cost, tolerated well) Humalog  (stopped d/t lows)  SMBG: ?Libre 3 CGM Report (Date Range 9/23 - 10/6) ABG 272 mg/dL; GMI 0.1%; Variability 27.2%; TIR 11%, high 25%, very high 64%, low 0%, very low 0%        Hypo/Hyperglycemia: ?  Symptoms of hypoglycemia since last visit:? no. Symptoms of hyperglycemia since last visit:?No   Reported Diet: Patient typically eats 1-2 meals per day  - 1 day per week: Has 3 meals  - 3 days per week: Has 1 meal (dinner)  - 3 days per week: Has 2 meals (lunch + dinner) Beverages: Used to drink diet soda 90% of the time. Now, drinks only water. With dinner, one diet drink. No more sweet tea.   DM Prevention:  Statin: Taking; moderate intensity.?  History of chronic kidney disease? no History of albuminuria? yes, (54 mg/g 2017), last UACR on 12/28/23 = too low to calculate ACE/ARB - Taking lisinorpil 40 mg daily; Urine MA/CR Ratio - normal.  Last eye exam: 08/2018; Retinopathy Present Last foot exam: 02/22/2024 Tobacco Use:  Never smoker Immunizations:? Flu: Up to date (Last: 05/29/2024); Pneumococcal: PCV20 (02/22/24); Shingrix: No Record - DUE   Cardiovascular Risk Reduction History of clinical ASCVD? no History of heart failure? no  History of hyperlipidemia? yes Current BMI: 26.1 kg/m2 (Ht 70 in, Wt 82.7 kg) Taking statin? yes; moderate intensity (atorvastatin  10) Taking aspirin? unclear if indicated; Taking   Taking SGLT-2i? no (cost) Taking GLP- 1 RA? no  (cost)      _______________________________________________  Objective    Review of Systems:? Limited in the setting of virtual visit  Constitutional:? No fever, chills or unintentional weight loss  GI:? No nausea, vomiting, constipation, diarrhea, abdominal pain, dyspepsia, change in bowel habits  Endocrine:? No polyuria, polyphagia or blurred vision    Physical Examination:  Vitals:  Wt Readings from Last 3 Encounters:  05/29/24 172 lb 12.8 oz (78.4 kg)  02/22/24 179 lb (81.2 kg)  02/14/24 178 lb 9.6 oz (81 kg)   BP Readings from Last 3 Encounters:  05/29/24 120/70  02/22/24 116/78  02/14/24 132/78   Pulse Readings from Last 3 Encounters:  05/29/24 77  02/22/24 90  02/14/24 85    Labs:?  Lab Results  Component Value Date   HGBA1C 7.8 (H) 05/18/2024   HGBA1C 7.3 (A) 02/14/2024   HGBA1C 8.7 (A) 12/28/2023   GLUCOSE 58 (L) 01/06/2024   MICRALBCREAT Unable to calculate 12/28/2023   CREATININE 1.22 01/06/2024   CREATININE 1.17 06/16/2023   CREATININE 1.31 11/16/2022   GFR 62.46 01/06/2024   GFR 65.94 06/16/2023   GFR 57.81 (L) 11/16/2022    Lab Results  Component Value Date   CHOL 83 01/06/2024   LDLCALC 42 01/06/2024   LDLCALC 43 06/16/2023   LDLCALC 64 01/12/2022   LDLDIRECT 58.0 09/13/2017   HDL 30.40 (L) 01/06/2024   TRIG 50.0 01/06/2024   TRIG 112.0 06/16/2023   TRIG 63.0 01/12/2022   ALT 15 01/06/2024   ALT 18 06/16/2023   AST 19 01/06/2024   AST 15 06/16/2023      Chemistry      Component Value Date/Time   NA 141 01/06/2024 0936   K 4.6 01/06/2024 0936   CL 108 01/06/2024 0936   CO2 24 01/06/2024 0936   BUN 18 01/06/2024 0936   CREATININE 1.22 01/06/2024 0936   CREATININE 1.17 06/28/2020 1515      Component Value Date/Time   CALCIUM  9.2 01/06/2024 0936   ALKPHOS 84 01/06/2024 0936   AST 19 01/06/2024 0936   ALT 15 01/06/2024 0936   BILITOT 0.6 01/06/2024 0936     The ASCVD Risk score (Arnett DK, et al., 2019) failed to calculate for  the following reasons:   The valid total cholesterol range is 130 to 320 mg/dL  Assessment and Plan:   1. Diabetes, type 2: uncontrolled - A1c 7.8% (05/18/24), increased from 7.3 after stopping Ozempic  an Humalog . Goal <7% w/o hypoglycemia. Goal remains <8.5% for operation on other shoulder in the future. Patient notes starting glipizide  XL 2.5 mg, at bedtime per instructions on bottle. Started only 2 days ago. Monitor for lows early morning, typically dosed prior to first meal of the day. Counseled on risk of lows given his history, especially with regularly skipping meals. CGM data: hyperglycemia (GMI >9%) though with notable dips to low 100s on just metformin  and lantus  Current Regimen: Metformin  2000mg /d, glargine 45 u/d, glipizide  XL 2.5 mg/hs Continue medications as prescribed by PCP.  No changes today d/t new glipizide  start only 2 days ago  Reviewed s/sx/tx hypoglycemia. Discussed risks of skipping meals on glipizide .  Future Consideration: *Re-evaluate brand cost in 2026 if insurance plan changes Prandial insulin : Preferred over glipizide  if c/f for recurrent lows arise given frequently skipped meals.  Basal insulin  > concentrated for better absorption if ongoing titration needed (Tresiba  U200 or Toujeo  U300) Tresiba  U200: Copay Card =  as little as $35 per 30-day supply (with a maximum of $65 per 30-day supply) or no more than $99 per prescription.  SGLT2i (cost-prohibitive, not eligible for PAP with commercial insurance. Copay card not sufficient in lowering cost) GLP1 (cost-prohibitive. Copay card not sufficient in lowering cost) DPP4i: (cost-prohibitive)   Medication Access: Is eligible for Medicare, though plans to continue with his high-deductible plan as this provides coverage for his wife who will not turn 65 soon. Brand medication will remain cost-prohibitive though he will reach out to his employer to see if there are different levels of plans to choose from during open  enrollment.   Open enrollment begins ~Nov 1st per patient.  Will review plan options. To discuss at follow up visit ~2-3 weeks w PharmD.    Healthcare Maintenance:  Pneumococcal - PCV20 06/27/24 - Complete Shingles - Current status: Due - no record Influenza - Current status: completed 05/29/24 Due to receive the following vaccines: Shingrix   Follow Up ~2 weeks with PharmD via phone  PCP 3-mo f/u as scheduled 12/30 Pt preference: Monday mornings 9, 10, 11 am  Future Appointments  Date Time Provider Department Center  06/12/2024 10:00 AM [facility] PHARMACIST [facility] [address]  09/05/2024  3:00 PM Bair, [place], MD [facility] [address] FABIENE Geronimo, PharmD Clinical Pharmacist [facility] [phone]

## 2024-06-26 ENCOUNTER — Other Ambulatory Visit: Admitting: Pharmacist

## 2024-06-26 DIAGNOSIS — E118 Type 2 diabetes mellitus with unspecified complications: Secondary | ICD-10-CM

## 2024-06-26 MED ORDER — FREESTYLE LIBRE 3 PLUS SENSOR MISC: 11 refills | Status: AC

## 2024-06-26 NOTE — Progress Notes (Signed)
 06/26/2024 Name: Jonathan West MRN: [id number] DOB: August 31, 1961  Subjective  Chief Complaint  Patient presents with   Diabetes    Reason for visit: ?  Jonathan West is a 63 y.o. male with a history of diabetes (type 2), who presents today for a follow up diabetes pharmacotherapy visit.? They were referred by with PCP for medication management related to Diabetes, HTN, HLD.  Pertinent PMH includes HTN, T2DM, HLD, diabetic neuropathy, ED.  Known DM Complications: autonomic neuropathy (ED), peripheral neuropathy, retinopathy  Care Team: Primary Care Provider: Dr.  Kalpana Bair   Recent Summary of Change: 9/22: PCP add glipizide  2.5mg  Insurance switched basaglar  to Lantus  6/17: PCP Stop Humalog . ?? basaglar  to 46 u 6/9: Notices blood sugars had gone up, self-increased back to 55u  5/27:?? basaglar  to 46u 5/5: ??basaglar  to 55 u/d (10%) 4/22: Start Farxiga  5 mg/d (sample), Start Ozempic  0.25 mg/week   Medication Access/Adherence: Prescription drug coverage: Payor: BLUE CROSS BLUE SHIELD / Plan: BCBS COMM PPO / Product Type: *No Product type* / . Very high deductible plan (deductible = multiple thousands. Brand-name meds not feasible) - Confirms he has been in contact with his Medicare contact to discuss plans. Turns 63 this week and plans to enroll in Medicare. Has provided active list of his meds to the Medicare person including Farxiga  and Ozempic .  - Has received Medicare A. Prescription benefits still covered fully through his job  Freestyle $68/month Basaglar : $120/90dsOzempic  $977 (using samples)  - Wife on current plan and therefore does not plan to switch to Medicare D plan  Since Last visit / History of Present Illness: ?  Patient reports sugars have remained very high lately though feels morning sugars are better after starting glipizide  a few weeks ago. Denies missed doses of any medications.   No longer taking Ozempic  due to cost.  Glipizide  was prescribed at last  PCP. Notes his bottle states glipizide  XL, 1 tablet at bedtime.  Reported DM Regimen: ?  Metformin  XR 2000 mg daily with breakfast Lantus  (glargine) 45 units daily (bedtime) Glipizide  XL 2.5 mg at bedtime ($50 at the pharmacy for 1 month with savings card) - (started Sat 10/4)   DM medications tried in the past:?  Metformin  IR (stomach upset, tolerated XR well) Jardiance  (stopped due to cost, tolerated well) Humalog  (stopped d/t lows)  SMBG: ?Libre 3 CGM Report (Date Range 10/6 - 10/19) ABG 261 mg/dL; GMI 0.3%; Variability 30.8%; TIR 20%, high 27%, very high 53%, low 0%, very low 0%       Hypo/Hyperglycemia: ?  Symptoms of hypoglycemia since last visit:? yes. X1, as above.  Symptoms of hyperglycemia since last visit:?No   Reported Diet: Patient typically eats 1-2 meals per day  - 1 day per week: Has 3 meals  - 3 days per week: Has 1 meal (dinner)  - 3 days per week: Has 2 meals (lunch + dinner) Beverages: Used to drink diet soda 90% of the time. Now, drinks only water. With dinner, one diet drink. No more sweet tea.   DM Prevention:  Statin: Taking; moderate intensity.?  History of chronic kidney disease? no History of albuminuria? yes, (54 mg/g 2017), last UACR on 12/28/23 = too low to calculate ACE/ARB - Taking lisinorpil 40 mg daily; Urine MA/CR Ratio - normal.  Last eye exam: 08/2018; Retinopathy Present Last foot exam: 02/22/2024 Tobacco Use: Never smoker Immunizations:? Flu: Up to date (Last: 05/29/2024); Pneumococcal: PCV20 (02/22/24); Shingrix: No Record - DUE  Cardiovascular Risk Reduction History of clinical ASCVD? no History of heart failure? no  History of hyperlipidemia? yes Current BMI: 26.1 kg/m2 (Ht 70 in, Wt 82.7 kg) Taking statin? yes; moderate intensity (atorvastatin  10) Taking aspirin? unclear if indicated; Taking   Taking SGLT-2i? no (cost) Taking GLP- 1 RA? no (cost)      _______________________________________________  Objective    Review of  Systems:? Limited in the setting of virtual visit  Constitutional:? No fever, chills or unintentional weight loss  GI:? No nausea, vomiting, constipation, diarrhea, abdominal pain, dyspepsia, change in bowel habits  Endocrine:? No polyuria, polyphagia or blurred vision    Physical Examination:  Vitals:  Wt Readings from Last 3 Encounters:  05/29/24 172 lb 12.8 oz (78.4 kg)  02/22/24 179 lb (81.2 kg)  02/14/24 178 lb 9.6 oz (81 kg)   BP Readings from Last 3 Encounters:  05/29/24 120/70  02/22/24 116/78  02/14/24 132/78   Pulse Readings from Last 3 Encounters:  05/29/24 77  02/22/24 90  02/14/24 85    Labs:?  Lab Results  Component Value Date   HGBA1C 7.8 (H) 05/18/2024   HGBA1C 7.3 (A) 02/14/2024   HGBA1C 8.7 (A) 12/28/2023   GLUCOSE 58 (L) 01/06/2024   MICRALBCREAT Unable to calculate 12/28/2023   CREATININE 1.22 01/06/2024   CREATININE 1.17 06/16/2023   CREATININE 1.31 11/16/2022   GFR 62.46 01/06/2024   GFR 65.94 06/16/2023   GFR 57.81 (L) 11/16/2022    Lab Results  Component Value Date   CHOL 83 01/06/2024   LDLCALC 42 01/06/2024   LDLCALC 43 06/16/2023   LDLCALC 64 01/12/2022   LDLDIRECT 58.0 09/13/2017   HDL 30.40 (L) 01/06/2024   TRIG 50.0 01/06/2024   TRIG 112.0 06/16/2023   TRIG 63.0 01/12/2022   ALT 15 01/06/2024   ALT 18 06/16/2023   AST 19 01/06/2024   AST 15 06/16/2023      Chemistry      Component Value Date/Time   NA 141 01/06/2024 0936   K 4.6 01/06/2024 0936   CL 108 01/06/2024 0936   CO2 24 01/06/2024 0936   BUN 18 01/06/2024 0936   CREATININE 1.22 01/06/2024 0936   CREATININE 1.17 06/28/2020 1515      Component Value Date/Time   CALCIUM  9.2 01/06/2024 0936   ALKPHOS 84 01/06/2024 0936   AST 19 01/06/2024 0936   ALT 15 01/06/2024 0936   BILITOT 0.6 01/06/2024 0936     The ASCVD Risk score (Arnett DK, et al., 2019) failed to calculate for the following reasons:   The valid total cholesterol range is 130 to 320  mg/dL  Assessment and Plan:   1. Diabetes, type 2: uncontrolled - A1c 7.8% (05/18/24), increased from 7.3 after stopping Ozempic  and Humalog . Goal <7% w/o hypoglycemia. Goal remains <8.5% for operation on other shoulder in the future.  Patient notes starting glipizide  XL 2.5 mg, at bedtime per instructions on bottle, a few weeks ago. One early morning low over the last 2 weeks. Counseled on risk of lows given his history, especially with regularly skipping meals. If onging lows, move glip to morning, 30 min prior to 1st meal.   CGM data: hyperglycemia (GMI remains >9%) though with notable AM dips <100 (limiting Lantus  titration). FBG mostly at/close to goal though with continued significant PP hyperglycemia in the 200-400s. Likely will require resumption of prandial insulin  at lower doses than previous give c/f lows in the past.  Current Regimen: Metformin  2000mg /d, glargine 45 u/d, glipizide   XL 2.5 mg/hs Continue medications as prescribed by PCP.  [facility] drug list has glipizide  XL 2.5 mg for $10/month (patient paying $50 at [facility]) - [facility] pharmacy for transfer + home delivery.  If any further early morning/overnight lows, take glipizide  prior to first meal of the day rather than at bedtime Consider resuming prandial insulin  with discontinuation of glipizide  at next visit if continued uncontrolled PP readings.   Reviewed s/sx/tx hypoglycemia. Discussed risks of skipping meals on glipizide .   Future Consideration: *Re-evaluate brand cost in 2026 if insurance plan changes Prandial insulin : Preferred over glipizide  if c/f for recurrent lows arise given frequently skipped meals.  Basal insulin  > concentrated for better absorption if ongoing titration needed (Tresiba  U200 or Toujeo  U300) Tresiba  U200: Copay Card =  as little as $35 per 30-day supply (with a maximum of $65 per 30-day supply) or no more than $99 per prescription.  SGLT2i (cost-prohibitive, not eligible for PAP with commercial  insurance. Copay card not sufficient in lowering cost) GLP1 (cost-prohibitive. Copay card not sufficient in lowering cost) DPP4i: (cost-prohibitive)   Medication Access: Is eligible for Medicare, though plans to continue with his high-deductible plan as this provides coverage for his wife who will not turn 65 soon. Brand medication will remain cost-prohibitive though he will reach out to his employer to see if there are different levels of plans to choose from during open enrollment.   Open enrollment begins ~Nov 1st per patient.    Healthcare Maintenance:  Pneumococcal - PCV20 06/27/24 - Complete Shingles - Current status: Due - no record Influenza - Current status: completed 05/29/24 Due to receive the following vaccines: Shingrix   Follow Up 4 weeks with PharmD via phone  PCP 3-mo f/u as scheduled 12/30 Pt preference: Monday mornings 10 am  Future Appointments  Date Time Provider Department Center  07/24/2024 10:00 AM [facility] PHARMACIST [facility] [address]  09/05/2024  3:00 PM Bair, [place], MD [facility] [address] FABIENE Geronimo, PharmD Clinical Pharmacist [facility] [phone]

## 2024-06-26 NOTE — Patient Instructions (Signed)
 [name],   It was a pleasure to speak with you today! As we discussed:?   Continue current medications as you have been taking them  If you notice any further low sugar readings overnight/early morning, you can move the glipizide  pill to the morning instead of at bedtime. We'll see how sugars do over the next few weeks. If you notice your after-meal sugars remain 200s+ then you may benefit from resuming mealtime injections at a lower dose in place of the glipizide . We will discuss his next month if sugars remain elevated.   Glipizide  XL 2.5 mg tablets are on the [facility] $10 list ($10 per month supply).  If the pharmacy does not reach out to you to confirm address/shipping details, please call them to confirm these details.  [facility] Home Delivery = [phone]  ____________________________________________________ Mail Order/Delivery ($0): [facility] offers home delivery services through all [facility], with no cost for shipping prescription medications.  Home delivery prescriptions often arrive next day. If your delivery address is outside of the [facility] service area, your prescriptions will be delivered by UPS and should arrive within two days.  You may contact [phone]) [phone] at any time with questions or requests to change your prescriptions to home delivery status.  Before your medications are mailed out to you for the very first time, you must call the number above to confirm your address and payment information.    How to Switch to a new Pharmacy: If you have medication refills remaining at your current pharmacy, you can call a [facility] and ask them to transfer the refills for you If you have no refills remaining, or have controlled substances that cannot be transferred, you may contact your prescriber and request medication refills to your new pharmacy.  If you have chosen to do mail order, your doctor can note this on your prescription.  Be  sure to let your previous pharmacy know you do not want them to keep refilling your medications each month automatically.    [facility] Locations [facility] at [place]  [facility] delivery pharmacy) [address] [place], [place] [address] Ph: [phone] Hours: 7:30 AM - 7 PM Mon-Fri; Sat 8 am - 4:30 pm; Sunday 10 am - 2 pm (starting 9/7)  [facility] at [facility] [address], [place], [place] [address] Ph: [phone] Hours: 7:30 AM - 7 PM Mon-Fri    Please reach out prior to your next scheduled appointment should you have any questions or concerns.  You may reach me via MyChart Message, or you may leave me a voicemail at [phone] and I will get back to you shortly.   Thank you!   Future Appointments  Date Time Provider Department Center  07/24/2024 10:00 AM [facility] PHARMACIST [facility] [address]  09/05/2024  3:00 PM Bair, [place], MD [facility] [address] FABIENE Geronimo, PharmD Clinical Pharmacist [facility] [phone]

## 2024-06-29 ENCOUNTER — Other Ambulatory Visit: Payer: Self-pay

## 2024-06-29 DIAGNOSIS — E1165 Type 2 diabetes mellitus with hyperglycemia: Secondary | ICD-10-CM

## 2024-07-24 ENCOUNTER — Other Ambulatory Visit

## 2024-07-25 ENCOUNTER — Other Ambulatory Visit

## 2024-07-25 NOTE — Progress Notes (Deleted)
 07/25/2024 Name: Jonathan West MRN: [id number] DOB: 1960-11-28  Subjective  No chief complaint on file.   Reason for visit: ?  Jonathan West is a 63 y.o. male with a history of diabetes (type 2), who presents today for a follow up diabetes pharmacotherapy visit.? They were referred by with PCP for medication management related to Diabetes, HTN, HLD.  Pertinent PMH includes HTN, T2DM, HLD, diabetic neuropathy, ED.  Known DM Complications: autonomic neuropathy (ED), peripheral neuropathy, retinopathy  Care Team: Primary Care Provider: Dr.  Kalpana Bair   Recent Summary of Change: 9/22: PCP add glipizide  2.5mg  Insurance switched basaglar  to Lantus  6/17: PCP Stop Humalog . ?? basaglar  to 46 u 6/9: Notices blood sugars had gone up, self-increased back to 55u  5/27:?? basaglar  to 46u 5/5: ??basaglar  to 55 u/d (10%) 4/22: Start Farxiga  5 mg/d (sample), Start Ozempic  0.25 mg/week   Medication Access/Adherence: Prescription drug coverage: Payor: BLUE CROSS BLUE SHIELD / Plan: BCBS COMM PPO / Product Type: *No Product type* / . Very high deductible plan (deductible = multiple thousands. Brand-name meds not feasible) - Confirms he has been in contact with his Medicare contact to discuss plans. Turns 63 this week and plans to enroll in Medicare. Has provided active list of his meds to the Medicare person including Farxiga  and Ozempic .  - Has received Medicare A. Prescription benefits still covered fully through his job  Freestyle $68/month Basaglar : $120/90dsOzempic  $977 (using samples)  - Wife on current plan and therefore does not plan to switch to Medicare D plan  Since Last visit / History of Present Illness: ?  Patient reports sugars have remained very high lately though feels morning sugars are better after starting glipizide  a few weeks ago. Denies missed doses of any medications.   No longer taking Ozempic  due to cost.  Glipizide  was prescribed at last PCP. Notes his bottle  states glipizide  XL, 1 tablet at bedtime.  Reported DM Regimen: ?  Metformin  XR 2000 mg daily with breakfast Lantus  (glargine) 45 units daily (bedtime) Glipizide  XL 2.5 mg at bedtime ($50 at the pharmacy for 1 month with savings card) - (started Sat 10/4)   DM medications tried in the past:?  Metformin  IR (stomach upset, tolerated XR well) Jardiance  (stopped due to cost, tolerated well) Humalog  (stopped d/t lows)  SMBG: ?Libre 3 CGM Report (Date Range 10/6 - 10/19) ABG 261 mg/dL; GMI 0.3%; Variability 30.8%; TIR 20%, high 27%, very high 53%, low 0%, very low 0%       Hypo/Hyperglycemia: ?  Symptoms of hypoglycemia since last visit:? yes. X1, as above.  Symptoms of hyperglycemia since last visit:?No   Reported Diet: Patient typically eats 1-2 meals per day  - 1 day per week: Has 3 meals  - 3 days per week: Has 1 meal (dinner)  - 3 days per week: Has 2 meals (lunch + dinner) Beverages: Used to drink diet soda 90% of the time. Now, drinks only water. With dinner, one diet drink. No more sweet tea.   DM Prevention:  Statin: Taking; moderate intensity.?  History of chronic kidney disease? no History of albuminuria? yes, (54 mg/g 2017), last UACR on 12/28/23 = too low to calculate ACE/ARB - Taking lisinorpil 40 mg daily; Urine MA/CR Ratio - normal.  Last eye exam: 08/2018; Retinopathy Present Last foot exam: 02/22/2024 Tobacco Use: Never smoker Immunizations:? Flu: Up to date (Last: 05/29/2024); Pneumococcal: PCV20 (02/22/24); Shingrix: No Record - DUE   Cardiovascular Risk Reduction History  of clinical ASCVD? no History of heart failure? no  History of hyperlipidemia? yes Current BMI: 26.1 kg/m2 (Ht 70 in, Wt 82.7 kg) Taking statin? yes; moderate intensity (atorvastatin  10) Taking aspirin? unclear if indicated; Taking   Taking SGLT-2i? no (cost) Taking GLP- 1 RA? no (cost)      _______________________________________________  Objective    Review of Systems:? Limited in  the setting of virtual visit  Constitutional:? No fever, chills or unintentional weight loss  GI:? No nausea, vomiting, constipation, diarrhea, abdominal pain, dyspepsia, change in bowel habits  Endocrine:? No polyuria, polyphagia or blurred vision    Physical Examination:  Vitals:  Wt Readings from Last 3 Encounters:  05/29/24 172 lb 12.8 oz (78.4 kg)  02/22/24 179 lb (81.2 kg)  02/14/24 178 lb 9.6 oz (81 kg)   BP Readings from Last 3 Encounters:  05/29/24 120/70  02/22/24 116/78  02/14/24 132/78   Pulse Readings from Last 3 Encounters:  05/29/24 77  02/22/24 90  02/14/24 85    Labs:?  Lab Results  Component Value Date   HGBA1C 7.8 (H) 05/18/2024   HGBA1C 7.3 (A) 02/14/2024   HGBA1C 8.7 (A) 12/28/2023   GLUCOSE 58 (L) 01/06/2024   MICRALBCREAT Unable to calculate 12/28/2023   CREATININE 1.22 01/06/2024   CREATININE 1.17 06/16/2023   CREATININE 1.31 11/16/2022   GFR 62.46 01/06/2024   GFR 65.94 06/16/2023   GFR 57.81 (L) 11/16/2022    Lab Results  Component Value Date   CHOL 83 01/06/2024   LDLCALC 42 01/06/2024   LDLCALC 43 06/16/2023   LDLCALC 64 01/12/2022   LDLDIRECT 58.0 09/13/2017   HDL 30.40 (L) 01/06/2024   TRIG 50.0 01/06/2024   TRIG 112.0 06/16/2023   TRIG 63.0 01/12/2022   ALT 15 01/06/2024   ALT 18 06/16/2023   AST 19 01/06/2024   AST 15 06/16/2023      Chemistry      Component Value Date/Time   NA 141 01/06/2024 0936   K 4.6 01/06/2024 0936   CL 108 01/06/2024 0936   CO2 24 01/06/2024 0936   BUN 18 01/06/2024 0936   CREATININE 1.22 01/06/2024 0936   CREATININE 1.17 06/28/2020 1515      Component Value Date/Time   CALCIUM  9.2 01/06/2024 0936   ALKPHOS 84 01/06/2024 0936   AST 19 01/06/2024 0936   ALT 15 01/06/2024 0936   BILITOT 0.6 01/06/2024 0936     The ASCVD Risk score (Arnett DK, et al., 2019) failed to calculate for the following reasons:   The valid total cholesterol range is 130 to 320 mg/dL  Assessment and Plan:   1.  Diabetes, type 2: uncontrolled - A1c 7.8% (05/18/24), increased from 7.3 after stopping Ozempic  and Humalog . Goal <7% w/o hypoglycemia. Goal remains <8.5% for operation on other shoulder in the future.  Patient notes starting glipizide  XL 2.5 mg, at bedtime per instructions on bottle, a few weeks ago. One early morning low over the last 2 weeks. Counseled on risk of lows given his history, especially with regularly skipping meals. If onging lows, move glip to morning, 30 min prior to 1st meal.   CGM data: hyperglycemia (GMI remains >9%) though with notable AM dips <100 (limiting Lantus  titration). FBG mostly at/close to goal though with continued significant PP hyperglycemia in the 200-400s. Likely will require resumption of prandial insulin  at lower doses than previous give c/f lows in the past.  Current Regimen: Metformin  2000mg /d, glargine 45 u/d, glipizide  XL 2.5 mg/hs Continue  medications as prescribed by PCP.  [facility] drug list has glipizide  XL 2.5 mg for $10/month (patient paying $50 at [facility]) - [facility] pharmacy for transfer + home delivery.  If any further early morning/overnight lows, take glipizide  prior to first meal of the day rather than at bedtime Consider resuming prandial insulin  with discontinuation of glipizide  at next visit if continued uncontrolled PP readings.   Reviewed s/sx/tx hypoglycemia. Discussed risks of skipping meals on glipizide .   Future Consideration: *Re-evaluate brand cost in 2026 if insurance plan changes Prandial insulin : Preferred over glipizide  if c/f for recurrent lows arise given frequently skipped meals.  Basal insulin  > concentrated for better absorption if ongoing titration needed (Tresiba  U200 or Toujeo  U300) Tresiba  U200: Copay Card =  as little as $35 per 30-day supply (with a maximum of $65 per 30-day supply) or no more than $99 per prescription.  SGLT2i (cost-prohibitive, not eligible for PAP with commercial insurance. Copay card not sufficient in  lowering cost) GLP1 (cost-prohibitive. Copay card not sufficient in lowering cost) DPP4i: (cost-prohibitive)   Medication Access: Is eligible for Medicare, though plans to continue with his high-deductible plan as this provides coverage for his wife who will not turn 65 soon. Brand medication will remain cost-prohibitive though he will reach out to his employer to see if there are different levels of plans to choose from during open enrollment.   Open enrollment begins ~Nov 1st per patient.    Healthcare Maintenance:  Pneumococcal - PCV20 06/27/24 - Complete Shingles - Current status: Due - no record Influenza - Current status: completed 05/29/24 Due to receive the following vaccines: Shingrix   Follow Up 4 weeks with PharmD via phone  PCP 3-mo f/u as scheduled 12/30 Pt preference: Monday mornings 10 am  Future Appointments  Date Time Provider Department Center  09/05/2024  3:00 PM Bair, [place], MD [facility] [address] FABIENE Geronimo, PharmD Clinical Pharmacist [facility] [phone]

## 2024-07-30 ENCOUNTER — Other Ambulatory Visit: Payer: Self-pay

## 2024-07-30 DIAGNOSIS — E1165 Type 2 diabetes mellitus with hyperglycemia: Secondary | ICD-10-CM

## 2024-09-05 ENCOUNTER — Ambulatory Visit

## 2024-09-09 ENCOUNTER — Other Ambulatory Visit: Payer: Self-pay

## 2024-09-09 DIAGNOSIS — E1165 Type 2 diabetes mellitus with hyperglycemia: Secondary | ICD-10-CM

## 2024-09-21 ENCOUNTER — Ambulatory Visit (INDEPENDENT_AMBULATORY_CARE_PROVIDER_SITE_OTHER)

## 2024-09-21 VITALS — BP 120/60 | HR 67 | Temp 97.7°F | Ht 70.0 in | Wt 174.8 lb

## 2024-09-21 DIAGNOSIS — E538 Deficiency of other specified B group vitamins: Secondary | ICD-10-CM | POA: Diagnosis not present

## 2024-09-21 DIAGNOSIS — I152 Hypertension secondary to endocrine disorders: Secondary | ICD-10-CM | POA: Diagnosis not present

## 2024-09-21 DIAGNOSIS — E118 Type 2 diabetes mellitus with unspecified complications: Secondary | ICD-10-CM | POA: Diagnosis not present

## 2024-09-21 DIAGNOSIS — Z794 Long term (current) use of insulin: Secondary | ICD-10-CM | POA: Diagnosis not present

## 2024-09-21 DIAGNOSIS — E1140 Type 2 diabetes mellitus with diabetic neuropathy, unspecified: Secondary | ICD-10-CM

## 2024-09-21 DIAGNOSIS — E782 Mixed hyperlipidemia: Secondary | ICD-10-CM

## 2024-09-21 DIAGNOSIS — E1165 Type 2 diabetes mellitus with hyperglycemia: Secondary | ICD-10-CM | POA: Diagnosis not present

## 2024-09-21 DIAGNOSIS — E1159 Type 2 diabetes mellitus with other circulatory complications: Secondary | ICD-10-CM

## 2024-09-21 MED ORDER — LANTUS SOLOSTAR 100 UNIT/ML ~~LOC~~ SOPN: 45.0000 [IU] | PEN_INJECTOR | Freq: Every day | SUBCUTANEOUS | 3 refills | Status: AC

## 2024-09-21 MED ORDER — LISINOPRIL 20 MG PO TABS: 40.0000 mg | ORAL_TABLET | Freq: Every day | ORAL | 3 refills | Status: DC

## 2024-09-21 MED ORDER — GLIPIZIDE 2.5 MG PO TABS: 2.5000 mg | ORAL_TABLET | Freq: Every day | ORAL | 3 refills | Status: DC

## 2024-09-21 NOTE — Assessment & Plan Note (Signed)
 Plan per uncontrolled type 2 diabetes mellitus with hyperglycemia.  Complications of type 2 diabetes includes hyperglycemia needing multiple diabetic medication including insulin , diabetic neuropathy of lower legs stable on gabapentin , hyperlipidemia, hypertension.   Orders:   insulin  glargine (LANTUS  SOLOSTAR) 100 UNIT/ML Solostar Pen; Inject 45 Units into the skin at bedtime.   glipiZIDE  2.5 MG TABS; Take 2.5 mg by mouth daily.

## 2024-09-21 NOTE — Assessment & Plan Note (Signed)
 Continue gabapentin  300 mg, 2 capsules by mouth in the morning and 3 capsules at bedtime.  Not needing refill at this time.

## 2024-09-21 NOTE — Progress Notes (Signed)
 "  Established Patient Office Visit   Subjective  Patient ID: Jonathan West, male    DOB: 1961/08/20  Age: 64 y.o. MRN: [id number]  Chief Complaint  Patient presents with   Diabetes    Discussed the use of AI scribe software for clinical note transcription with the patient, who gave verbal consent to proceed.  History of Present Illness Jonathan West is a 64 year old male with diabetes, hyperlipidemia, hypertension who presents for follow-up of his diabetes management.  Since his last visit he has experienced two or three episodes of hypoglycemia, with blood glucose levels dropping to the upper sixties, specifically around 68-69 mg/dL. He is unsure of the cause but speculates it might be due to eating dinner earlier than usual. He also mentions experiencing higher blood glucose levels since adjustments were made to avoid lows.    He is currently using a Freestyle Libre 3 for glucose monitoring and takes several medications for diabetes management, including  Metformin  2000 mg with breakfast, glipizide  2.5 mg, and Lantus  45 units at bedtime.   He notes a recent issue with insurance regarding a generic version of Lantus , but he continues to use the brand name.  He takes gabapentin  300 mg 2 capsules in a.m. and 3 capsules in p.m. for diabetic neuropathy.  He continues to take vitamin B12 2000 mcg daily.    Is also taking amlodipine  5 mg, lisinopril  40 mg (2 tablets of 20 mg daily) for hypertension.   He continues to take aspirin 81 mg daily and Lipitor 10 mg daily for cholesterol management.  He is planning a cruise in two weeks and is concerned about having enough medication, particularly Lantus , for the trip. He estimates he gets about five to six days out of a pen and is coordinating refills to ensure he has enough supply.  He mentions a past A1c level of 7.3% to 7.2%, with the lowest being 7.3% in the last 15 years. He plans to have his A1c checked again soon.    ROS As per  HPI    Objective:     BP 120/60 (Cuff Size: Normal)   Pulse 67   Temp 97.7 F (36.5 C) (Oral)   Ht 5' 10 (1.778 m)   Wt 174 lb 12.8 oz (79.3 kg)   SpO2 97%   BMI 25.08 kg/m      09/21/2024    4:39 PM 05/29/2024    2:09 PM 02/22/2024    1:16 PM  Depression screen PHQ 2/9  Decreased Interest 0 0 0  Down, Depressed, Hopeless 0 0 0  PHQ - 2 Score 0 0 0  Altered sleeping 0 0 0  Tired, decreased energy 0 1 0  Change in appetite 0 0 0  Feeling bad or failure about yourself  0 0 0  Trouble concentrating 0 0 0  Moving slowly or fidgety/restless 0 0 0  Suicidal thoughts 0 0 0  PHQ-9 Score 0 1  0   Difficult doing work/chores Not difficult at all Not difficult at all Not difficult at all     Data saved with a previous flowsheet row definition      09/21/2024    4:39 PM 05/29/2024    2:09 PM 02/22/2024    1:16 PM 02/14/2024   11:34 AM  GAD 7 : Generalized Anxiety Score  Nervous, Anxious, on Edge 0 0 0 0  Control/stop worrying 0 0 0 0  Worry too much - different things 0  0 0 0  Trouble relaxing 0 0 0 0  Restless 0 0 0 0  Easily annoyed or irritable 0 0 0 0  Afraid - awful might happen 0 0 0 0  Total GAD 7 Score 0 0 0 0  Anxiety Difficulty Not difficult at all Not difficult at all Not difficult at all Not difficult at all      09/21/2024    4:39 PM 05/29/2024    2:09 PM 02/22/2024    1:16 PM  Depression screen PHQ 2/9  Decreased Interest 0 0 0  Down, Depressed, Hopeless 0 0 0  PHQ - 2 Score 0 0 0  Altered sleeping 0 0 0  Tired, decreased energy 0 1 0  Change in appetite 0 0 0  Feeling bad or failure about yourself  0 0 0  Trouble concentrating 0 0 0  Moving slowly or fidgety/restless 0 0 0  Suicidal thoughts 0 0 0  PHQ-9 Score 0 1  0   Difficult doing work/chores Not difficult at all Not difficult at all Not difficult at all     Data saved with a previous flowsheet row definition      09/21/2024    4:39 PM 05/29/2024    2:09 PM 02/22/2024    1:16 PM 02/14/2024    11:34 AM  GAD 7 : Generalized Anxiety Score  Nervous, Anxious, on Edge 0 0 0 0  Control/stop worrying 0 0 0 0  Worry too much - different things 0 0 0 0  Trouble relaxing 0 0 0 0  Restless 0 0 0 0  Easily annoyed or irritable 0 0 0 0  Afraid - awful might happen 0 0 0 0  Total GAD 7 Score 0 0 0 0  Anxiety Difficulty Not difficult at all Not difficult at all Not difficult at all Not difficult at all   SDOH Screenings   Food Insecurity: No Food Insecurity (09/21/2024)  Housing: Low Risk (09/21/2024)  Transportation Needs: No Transportation Needs (09/21/2024)  Utilities: Not At Risk (09/12/2024)   Received from [facility]  Depression [phone]): Low Risk (09/21/2024)  Financial Resource Strain: Low Risk (09/21/2024)  Physical Activity: Insufficiently Active (09/21/2024)  Social Connections: Moderately Integrated (09/21/2024)  Stress: No Stress Concern Present (09/21/2024)  Tobacco Use: Low Risk (09/21/2024)     Physical Exam Constitutional:      General: He is not in acute distress.    Appearance: Normal appearance.  HENT:     Head: Normocephalic and atraumatic.     Mouth/Throat:     Mouth: Mucous membranes are moist.  Neck:     Thyroid : No thyroid  mass or thyroid  tenderness.  Cardiovascular:     Rate and Rhythm: Normal rate and regular rhythm.  Pulmonary:     Effort: Pulmonary effort is normal.     Breath sounds: Normal breath sounds.  Abdominal:     General: Bowel sounds are normal.     Palpations: Abdomen is soft.     Tenderness: There is no abdominal tenderness. There is no guarding.  Musculoskeletal:     Cervical back: Neck supple. No rigidity.     Right lower leg: No edema.     Left lower leg: No edema.  Skin:    General: Skin is warm.  Neurological:     Mental Status: He is alert and oriented to person, place, and time.  Psychiatric:        Mood and Affect: Mood normal.  Behavior: Behavior normal.        No results found for any visits  on 09/21/24.  The ASCVD Risk score (Arnett DK, et al., 2019) failed to calculate for the following reasons:   The valid total cholesterol range is 130 to 320 mg/dL     Assessment & Plan:   Assessment & Plan Uncontrolled type 2 diabetes mellitus with hyperglycemia (HCC) Needing multiple diabetic medication including Lantus , metformin , glipizide .  Fluctuating blood glucose with hypoglycemic and hyperglycemic episodes.  Discussed risks related to hyper and hypoglycemia.  Briefly discussed measures on monitoring home blood glucose especially during sleep to reduce risk of hypoglycemia and related complications. Due for repeat A1c, he already has repeat A1c ordered and plans on coming on 09/22/2024 for lab only appointment.  Continue metformin  2000 mg daily, glipizide  2.5 mg daily and Lantus  45 U at bedtime.    Hypertension associated with diabetes (HCC) Blood pressure on arrival was elevated however repeat blood pressure within goal of less than 130 x 80 mmHg.  Continue amlodipine  5 mg daily, propranolol  60 mg daily and lisinopril  40 mg daily. Orders:   lisinopril  (ZESTRIL ) 20 MG tablet; Take 2 tablets (40 mg total) by mouth daily.  Type 2 diabetes mellitus with complications (HCC) Plan per uncontrolled type 2 diabetes mellitus with hyperglycemia.  Complications of type 2 diabetes includes hyperglycemia needing multiple diabetic medication including insulin , diabetic neuropathy of lower legs stable on gabapentin , hyperlipidemia, hypertension.   Orders:   insulin  glargine (LANTUS  SOLOSTAR) 100 UNIT/ML Solostar Pen; Inject 45 Units into the skin at bedtime.   glipiZIDE  2.5 MG TABS; Take 2.5 mg by mouth daily.  Type 2 diabetes mellitus with diabetic neuropathy, with long-term current use of insulin  (HCC) Continue gabapentin  300 mg, 2 capsules by mouth in the morning and 3 capsules at bedtime.  Not needing refill at this time.    Mixed hyperlipidemia Continue atorvastatin  10 mg and aspirin 81  mg daily.    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      Return for Non-fasting lab when convinient for patient, follow up with Dr. Abbey in 3 months .   Luke Abbey, MD "

## 2024-09-21 NOTE — Assessment & Plan Note (Signed)
 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

## 2024-09-21 NOTE — Assessment & Plan Note (Signed)
 Blood pressure on arrival was elevated however repeat blood pressure within goal of less than 130 x 80 mmHg.  Continue amlodipine  5 mg daily, propranolol  60 mg daily and lisinopril  40 mg daily. Orders:   lisinopril  (ZESTRIL ) 20 MG tablet; Take 2 tablets (40 mg total) by mouth daily.

## 2024-09-21 NOTE — Assessment & Plan Note (Signed)
 Continue atorvastatin  10 mg and aspirin 81 mg daily.

## 2024-09-22 ENCOUNTER — Other Ambulatory Visit

## 2024-09-22 DIAGNOSIS — I152 Hypertension secondary to endocrine disorders: Secondary | ICD-10-CM

## 2024-09-22 DIAGNOSIS — E1159 Type 2 diabetes mellitus with other circulatory complications: Secondary | ICD-10-CM

## 2024-09-22 DIAGNOSIS — E1165 Type 2 diabetes mellitus with hyperglycemia: Secondary | ICD-10-CM

## 2024-09-22 NOTE — Addendum Note (Signed)
 Addended by: ANICE BELT on: 09/22/2024 04:42 PM   Modules accepted: Orders

## 2024-09-23 LAB — COMPREHENSIVE METABOLIC PANEL WITH GFR
ALT: 15 IU/L (ref 0–44)
AST: 14 IU/L (ref 0–40)
Albumin: 4.3 g/dL (ref 3.9–4.9)
Alkaline Phosphatase: 133 IU/L — ABNORMAL HIGH (ref 47–123)
BUN/Creatinine Ratio: 10 (ref 10–24)
BUN: 12 mg/dL (ref 8–27)
Bilirubin Total: 0.5 mg/dL (ref 0.0–1.2)
CO2: 23 mmol/L (ref 20–29)
Calcium: 8.9 mg/dL (ref 8.6–10.2)
Chloride: 102 mmol/L (ref 96–106)
Creatinine, Ser: 1.21 mg/dL (ref 0.76–1.27)
Globulin, Total: 2.3 g/dL (ref 1.5–4.5)
Glucose: 221 mg/dL — ABNORMAL HIGH (ref 70–99)
Potassium: 4.2 mmol/L (ref 3.5–5.2)
Sodium: 139 mmol/L (ref 134–144)
Total Protein: 6.6 g/dL (ref 6.0–8.5)
eGFR: 66 mL/min/1.73

## 2024-09-23 LAB — HEMOGLOBIN A1C
Est. average glucose Bld gHb Est-mCnc: 232 mg/dL
Hgb A1c MFr Bld: 9.7 % — ABNORMAL HIGH (ref 4.8–5.6)

## 2024-09-25 ENCOUNTER — Ambulatory Visit: Payer: Self-pay

## 2024-09-25 NOTE — Progress Notes (Signed)
 [name] Thank you for your help.   Luke Shade, MD

## 2024-10-02 ENCOUNTER — Telehealth: Payer: Self-pay | Admitting: Pharmacist

## 2024-10-02 DIAGNOSIS — E1159 Type 2 diabetes mellitus with other circulatory complications: Secondary | ICD-10-CM

## 2024-10-02 DIAGNOSIS — E118 Type 2 diabetes mellitus with unspecified complications: Secondary | ICD-10-CM

## 2024-10-02 MED ORDER — LISINOPRIL 20 MG PO TABS: 40.0000 mg | ORAL_TABLET | Freq: Every day | ORAL | 3 refills | Status: AC

## 2024-10-02 MED ORDER — NOVOLOG FLEXPEN 100 UNIT/ML ~~LOC~~ SOPN: PEN_INJECTOR | SUBCUTANEOUS | 2 refills | Status: DC

## 2024-10-02 MED ORDER — INSULIN LISPRO (1 UNIT DIAL) 100 UNIT/ML (KWIKPEN): PEN_INJECTOR | SUBCUTANEOUS | 2 refills | Status: DC

## 2024-10-03 ENCOUNTER — Other Ambulatory Visit

## 2024-10-03 NOTE — Telephone Encounter (Signed)
 Copied from CRM #[id number]. Topic: General - Call Back - No Documentation >> Oct 03, 2024 12:42 PM Shereese L wrote: Reason for CRM: Patient is returning [place] call in reference to script. Requesting a call back at [phone]

## 2024-10-05 ENCOUNTER — Telehealth: Payer: Self-pay | Admitting: Pharmacist

## 2024-10-05 ENCOUNTER — Other Ambulatory Visit: Payer: Self-pay

## 2024-10-05 DIAGNOSIS — E118 Type 2 diabetes mellitus with unspecified complications: Secondary | ICD-10-CM

## 2024-10-05 MED ORDER — NOVOLOG FLEXPEN 100 UNIT/ML ~~LOC~~ SOPN
PEN_INJECTOR | SUBCUTANEOUS | 0 refills | Status: DC
  Filled 2024-10-05: qty 15, 30d supply, fill #0

## 2024-10-05 NOTE — Addendum Note (Signed)
 Addended by: GERONIMO MANUELITA SAUNDERS on: 10/05/2024 04:44 PM   Modules accepted: Orders

## 2024-10-05 NOTE — Progress Notes (Addendum)
 Brief Telephone Documentation Reason for Call: Patient left message regarding question for pharmacist regarding Novolog  coupon card  Summary of Call: [facility] having issues getting the Novolog  savings card through   Will attempt copay card through [facility]) so we can see more clearly what the issue is. Suspect billing error on [facility]'s end, though unclear.   Considerations/Plan: Novo copay card reduced cost to $99/month given his insurance copay is >$100.  The $35/month would apply if insurance copay was <$100/month.   Evaluated Humalog  card to see if better coverage. [facility] was able to process Humalog  for $35/month and will fill this for patient today. Patient updated via phone call.    Reviewed Humalog  savings card details: For Insured/Covered Patients - Submit the co-pay authorized by the patients primary insurance as a secondary claim to [place] using BIN [id number] and using the Coordination of Benefits fields with Coverage Code type 08. This will reduce the eligible patients out-of-pocket costs to as little as $35, subject to a maximum monthly savings of $3,000 per prescription fill for each Covered Insulin  and a separate maximum annual savings of 16,000 per calendar year for each Covered Insulin  For Insured/Not Covered Patients - If one of the above-mentioned Covered Insulins is not covered by the patients insurance, continue to process the Card along with the patients insurance card using the Coordination of Benefits fields with Coverage Code type 03. This will reduce the eligible patients out-of-pocket costs to as little as $35, subject to a maximum monthly savings of wholesale acquisition cost plus usual and customary pharmacy charges, up to a maximum of 14 prescription fills per calendar year for each Covered Insulin   Manuelita FABIENE Kobs, PharmD, JAQUELINE, CPP Clinical Pharmacist Practitioner [facility] Ph: [phone]

## 2024-10-09 ENCOUNTER — Other Ambulatory Visit: Payer: Self-pay

## 2024-10-16 ENCOUNTER — Other Ambulatory Visit

## 2024-12-21 ENCOUNTER — Ambulatory Visit
